# Patient Record
Sex: Female | Born: 1970 | Race: Black or African American | Hispanic: No | Marital: Single | Smoking: Current some day smoker
Health system: Southern US, Community
[De-identification: ages and names within clinical notes are randomized; demographics above are authoritative.]

## PROBLEM LIST (undated history)

## (undated) DIAGNOSIS — D649 Anemia, unspecified: Secondary | ICD-10-CM

## (undated) DIAGNOSIS — K0889 Other specified disorders of teeth and supporting structures: Secondary | ICD-10-CM

## (undated) DIAGNOSIS — Z5189 Encounter for other specified aftercare: Secondary | ICD-10-CM

## (undated) DIAGNOSIS — K254 Chronic or unspecified gastric ulcer with hemorrhage: Secondary | ICD-10-CM

## (undated) HISTORY — DX: Other specified disorders of teeth and supporting structures: K08.89

## (undated) HISTORY — DX: Anemia, unspecified: D64.9

## (undated) HISTORY — DX: Encounter for other specified aftercare: Z51.89

## (undated) HISTORY — DX: Chronic or unspecified gastric ulcer with hemorrhage: K25.4

---

## 1998-03-14 ENCOUNTER — Emergency Department (HOSPITAL_COMMUNITY): Admission: EM | Admit: 1998-03-14 | Discharge: 1998-03-14 | Payer: Self-pay | Admitting: Emergency Medicine

## 1998-03-19 ENCOUNTER — Emergency Department (HOSPITAL_COMMUNITY): Admission: EM | Admit: 1998-03-19 | Discharge: 1998-03-19 | Payer: Self-pay | Admitting: Emergency Medicine

## 2003-07-03 ENCOUNTER — Emergency Department (HOSPITAL_COMMUNITY): Admission: EM | Admit: 2003-07-03 | Discharge: 2003-07-03 | Payer: Self-pay | Admitting: Emergency Medicine

## 2005-12-23 ENCOUNTER — Emergency Department (HOSPITAL_COMMUNITY): Admission: EM | Admit: 2005-12-23 | Discharge: 2005-12-23 | Payer: Self-pay | Admitting: Emergency Medicine

## 2006-12-17 ENCOUNTER — Emergency Department (HOSPITAL_COMMUNITY): Admission: EM | Admit: 2006-12-17 | Discharge: 2006-12-18 | Payer: Self-pay | Admitting: Emergency Medicine

## 2009-05-13 ENCOUNTER — Emergency Department (HOSPITAL_COMMUNITY): Admission: EM | Admit: 2009-05-13 | Discharge: 2009-05-13 | Payer: Self-pay | Admitting: Emergency Medicine

## 2009-07-18 ENCOUNTER — Emergency Department (HOSPITAL_COMMUNITY): Admission: EM | Admit: 2009-07-18 | Discharge: 2009-07-18 | Payer: Self-pay | Admitting: Emergency Medicine

## 2009-12-15 ENCOUNTER — Other Ambulatory Visit: Admission: RE | Admit: 2009-12-15 | Discharge: 2009-12-15 | Payer: Self-pay | Admitting: Family Medicine

## 2010-02-28 ENCOUNTER — Emergency Department (HOSPITAL_COMMUNITY): Admission: EM | Admit: 2010-02-28 | Discharge: 2010-02-28 | Payer: Self-pay | Admitting: Family Medicine

## 2010-11-19 ENCOUNTER — Encounter: Payer: Self-pay | Admitting: Family Medicine

## 2014-09-24 ENCOUNTER — Inpatient Hospital Stay (HOSPITAL_COMMUNITY)
Admission: EM | Admit: 2014-09-24 | Discharge: 2014-09-28 | DRG: 812 | Disposition: A | Discharge status: Home / Self Care | Payer: Self-pay | Attending: Internal Medicine | Admitting: Internal Medicine

## 2014-09-24 ENCOUNTER — Encounter (HOSPITAL_COMMUNITY): Payer: Self-pay

## 2014-09-24 DIAGNOSIS — T39315A Adverse effect of propionic acid derivatives, initial encounter: Secondary | ICD-10-CM | POA: Diagnosis present

## 2014-09-24 DIAGNOSIS — K259 Gastric ulcer, unspecified as acute or chronic, without hemorrhage or perforation: Secondary | ICD-10-CM | POA: Diagnosis present

## 2014-09-24 DIAGNOSIS — E6609 Other obesity due to excess calories: Secondary | ICD-10-CM | POA: Diagnosis present

## 2014-09-24 DIAGNOSIS — K0889 Other specified disorders of teeth and supporting structures: Secondary | ICD-10-CM | POA: Insufficient documentation

## 2014-09-24 DIAGNOSIS — K921 Melena: Secondary | ICD-10-CM | POA: Insufficient documentation

## 2014-09-24 DIAGNOSIS — K088 Other specified disorders of teeth and supporting structures: Secondary | ICD-10-CM | POA: Diagnosis present

## 2014-09-24 DIAGNOSIS — R7989 Other specified abnormal findings of blood chemistry: Secondary | ICD-10-CM | POA: Diagnosis present

## 2014-09-24 DIAGNOSIS — F17210 Nicotine dependence, cigarettes, uncomplicated: Secondary | ICD-10-CM | POA: Diagnosis present

## 2014-09-24 DIAGNOSIS — D72829 Elevated white blood cell count, unspecified: Secondary | ICD-10-CM | POA: Diagnosis present

## 2014-09-24 DIAGNOSIS — K2980 Duodenitis without bleeding: Secondary | ICD-10-CM | POA: Diagnosis present

## 2014-09-24 DIAGNOSIS — Z6841 Body Mass Index (BMI) 40.0 and over, adult: Secondary | ICD-10-CM

## 2014-09-24 DIAGNOSIS — E861 Hypovolemia: Secondary | ICD-10-CM | POA: Diagnosis present

## 2014-09-24 DIAGNOSIS — D62 Acute posthemorrhagic anemia: Principal | ICD-10-CM | POA: Diagnosis present

## 2014-09-24 DIAGNOSIS — R1013 Epigastric pain: Secondary | ICD-10-CM | POA: Diagnosis present

## 2014-09-24 DIAGNOSIS — K922 Gastrointestinal hemorrhage, unspecified: Secondary | ICD-10-CM | POA: Diagnosis present

## 2014-09-24 DIAGNOSIS — I951 Orthostatic hypotension: Secondary | ICD-10-CM | POA: Diagnosis present

## 2014-09-24 MED ORDER — HYDROMORPHONE HCL 1 MG/ML IJ SOLN
0.5000 mg | INTRAMUSCULAR | Status: AC
  Administered 2014-09-25: 0.5 mg via INTRAVENOUS
  Filled 2014-09-24: qty 1

## 2014-09-24 MED ORDER — ONDANSETRON HCL 4 MG/2ML IJ SOLN
4.0000 mg | Freq: Once | INTRAMUSCULAR | Status: AC
  Administered 2014-09-25: 4 mg via INTRAVENOUS
  Filled 2014-09-24: qty 2

## 2014-09-24 MED ORDER — SODIUM CHLORIDE 0.9 % IV BOLUS (SEPSIS)
1000.0000 mL | INTRAVENOUS | Status: AC
  Administered 2014-09-25: 1000 mL via INTRAVENOUS

## 2014-09-24 NOTE — ED Notes (Signed)
Pt comes via [facility] from home, abd pain and diarrhea for past few days, tonight weakness and dizziness when sitting up. Initial EMS BP was 90/60.

## 2014-09-24 NOTE — ED Provider Notes (Signed)
CSN: [id number]     Arrival date & time 09/24/14  2304 History  This chart was scribed for Purvis SheffieldForrest Paula Disbrow, MD by Gwenyth Oberatherine Macek, ED Scribe. This patient was seen in room A05C/A05C and the patient's care was started at 11:34 PM.   Chief Complaint  Patient presents with  . Dizziness   Patient is a 43 y.o. female presenting with dizziness and abdominal pain. The history is provided by the patient. No language interpreter was used.  Dizziness Associated symptoms: diarrhea, nausea and vomiting   Abdominal Pain Pain location:  Epigastric Pain quality: cramping   Pain radiates to:  Does not radiate Pain severity:  Mild Onset quality:  Gradual Duration:  1 day Timing:  Constant Progression:  Unchanged Chronicity:  New Context: not alcohol use and not previous surgeries   Relieved by:  Nothing Worsened by:  Nothing tried Ineffective treatments:  None tried Associated symptoms: diarrhea, nausea and vomiting   Associated symptoms: no cough, no fatigue and no hematuria   Diarrhea:    Quality:  Black and tarry   Number of occurrences:  3   Duration:  1 day   Timing:  Intermittent   Progression:  Unable to specify Risk factors: has not had multiple surgeries and not pregnant     HPI Comments: Paula Thompson is a 43 y.o. female who presents to the Emergency Department complaining of cramping, central abdominal pain that started 1 day ago. Pt notes 3 episodes of black-colored diarrhea, diaphoresis, nausea, vomiting, dizziness when walking and a nose bleed that occurred yesterday as associated symptoms. Pt's mother called paramedics today who took BP which measured 90/60. Pt does not take anti-coagulants or Aspirin. She reports occasional EtOH use and that she smokes regularly. Pt denies taking regular medications, history of medical problems, and possibility of pregnancy. Her family denies history of GI bleed. Pt had multiple episodes of brown-colored vomit in the ED. Upon vomiting, pt states  abdominal pain improved.  History reviewed. No pertinent past medical history. History reviewed. No pertinent past surgical history. No family history on file. History  Substance Use Topics  . Smoking status: Current Every Day Smoker  . Smokeless tobacco: Not on file  . Alcohol Use: Yes   OB History    No data available     Review of Systems  Constitutional: Positive for diaphoresis. Negative for appetite change and fatigue.  HENT: Negative for congestion, ear discharge and sinus pressure.   Eyes: Negative for discharge.  Respiratory: Negative for cough.   Gastrointestinal: Positive for nausea, vomiting, abdominal pain and diarrhea.  Genitourinary: Negative for frequency and hematuria.  Musculoskeletal: Negative for back pain.  Skin: Negative for rash.  Neurological: Positive for dizziness. Negative for seizures.  Psychiatric/Behavioral: Negative for hallucinations.   Allergies  Review of patient's allergies indicates no known allergies.  Home Medications   Prior to Admission medications   Not on File   BP 99/48 mmHg  Pulse 122  Temp(Src) 98 F (36.7 C) (Oral)  Resp 25  Ht 5\' 5"  (1.651 m)  Wt 247 lb (112.038 kg)  BMI 41.10 kg/m2  SpO2 95%  LMP 09/17/2014 Physical Exam  Constitutional: She is oriented to person, place, and time. She appears well-developed and well-nourished. No distress.  HENT:  Head: Normocephalic and atraumatic.  Mouth/Throat: Oropharynx is clear and moist.  Eyes: EOM are normal. Pupils are equal, round, and reactive to light.  Neck: Normal range of motion.  Cardiovascular: Regular rhythm and normal heart sounds.  HR 120's  Pulmonary/Chest: Effort normal and breath sounds normal.  Abdominal: Soft. She exhibits no distension. There is tenderness.  Mild TTP to epigastric area  Genitourinary:  Normal-appearing external rectum. Melanotic appearing stool. Fecal occult positive.  Musculoskeletal: Normal range of motion.  Neurological: She is  alert and oriented to person, place, and time.  Skin: Skin is warm and dry.  Psychiatric: She has a normal mood and affect. Judgment normal.  Nursing note and vitals reviewed.   ED Course  Procedures (including critical care time) DIAGNOSTIC STUDIES: Oxygen Saturation is 95% on RA, adequate by my interpretation.    COORDINATION OF CARE: 11:39 PM Discussed treatment plan with pt at bedside and pt agreed to plan.  Labs Review Labs Reviewed  CBC - Abnormal; Notable for the following:    WBC 17.1 (*)    RBC 3.03 (*)    Hemoglobin 7.7 (*)    HCT 24.5 (*)    MCH 25.4 (*)    RDW 16.4 (*)    All other components within normal limits  BASIC METABOLIC PANEL - Abnormal; Notable for the following:    Glucose, Bld 150 (*)    BUN 54 (*)    GFR calc non Af Amer 79 (*)    All other components within normal limits  HEPATIC FUNCTION PANEL - Abnormal; Notable for the following:    Albumin 2.9 (*)    All other components within normal limits  CBG MONITORING, ED - Abnormal; Notable for the following:    Glucose-Capillary 144 (*)    All other components within normal limits  POC OCCULT BLOOD, ED - Abnormal; Notable for the following:    Fecal Occult Bld POSITIVE (*)    All other components within normal limits  LACTIC ACID, PLASMA  PROTIME-INR  URINALYSIS, ROUTINE W REFLEX MICROSCOPIC  OCCULT BLOOD X 1 CARD TO LAB, STOOL  POC URINE PREG, ED  TYPE AND SCREEN  ABO/RH    Imaging Review Ct Abdomen Pelvis W Contrast  09/25/2014   CLINICAL DATA:  43 year old female with a several day history of abdominal pain and diarrhea.  EXAM: CT ABDOMEN AND PELVIS WITH CONTRAST  TECHNIQUE: Multidetector CT imaging of the abdomen and pelvis was performed using the standard protocol following bolus administration of intravenous contrast.  CONTRAST:  OMNIPAQUE IOHEXOL 300 MG/ML  SOLN  COMPARISON:  Lumbar spine radiographs 05/13/2009  FINDINGS: Lower Chest: Respiratory motion limits evaluation for small  pulmonary nodules. Within these limitations, the visualized lungs are clear. Visualized cardiac structures are within normal limits for size. Unremarkable distal thoracic esophagus.  Abdomen: Unremarkable appearance of the stomach, duodenum, spleen, adrenal glands and pancreas. Normal hepatic contour and morphology. Tiny sub cm hypo attenuating focus in the right hepatic lobe is most consistent with a small cyst. Gallbladder is unremarkable. No intra or extrahepatic biliary ductal dilatation. Unremarkable appearance of the bilateral kidneys. No focal solid lesion, hydronephrosis or nephrolithiasis. No evidence of obstruction or focal bowel wall thickening. Normal appendix in the right lower quadrant. The terminal ileum is unremarkable. No free fluid or suspicious adenopathy.  Pelvis: Unremarkable bladder, uterus and adnexa. Trace free fluid is likely physiologic. No suspicious adenopathy.  Bones/Soft Tissues: No acute fracture or aggressive appearing lytic or blastic osseous lesion.  Vascular: No significant atherosclerotic vascular disease, aneurysmal dilatation or acute abnormality.  IMPRESSION: No acute abnormality in the abdomen or pelvis to explain the patient's clinical symptoms.   Electronically Signed   By: Isac Caddy.D.  On: 09/25/2014 01:18     EKG Interpretation   Date/Time:  Saturday September 25 2014 00:01:41 EST Ventricular Rate:  123 PR Interval:  135 QRS Duration: 83 QT Interval:  328 QTC Calculation: 469 R Axis:   90 Text Interpretation:  Sinus tachycardia Borderline right axis deviation  Consider left ventricular hypertrophy no previous for comparison Confirmed  by Yamato Kopf  MD, Tacie Mccuistion ([id number]) on 09/25/2014 12:24:16 AM     CRITICAL CARE Performed by: Purvis Sheffield, S Total critical care time: 30 min Critical care time was exclusive of separately billable procedures and treating other patients. Critical care was necessary to treat or prevent imminent or  life-threatening deterioration. Critical care was time spent personally by me on the following activities: development of treatment plan with patient and/or surrogate as well as nursing, discussions with consultants, evaluation of patient's response to treatment, examination of patient, obtaining history from patient or surrogate, ordering and performing treatments and interventions, ordering and review of laboratory studies, ordering and review of radiographic studies, pulse oximetry and re-evaluation of patient's condition.   MDM   Final diagnoses:  Epigastric pain  Gastrointestinal hemorrhage with melena  Acute blood loss anemia    1:05 AM 43 y.o. female who presents with nausea, dark stools, and epigastric cramping since yesterday. She is noted to be tachycardic and mildly hypotensive here. She notes no history of GI bleeding. She had several episodes of coffee-ground emesis on my exam. Her stool appears melanotic and was occult positive. We'll start Protonix IV. We'll give a bolus of fluid, pain control, and get screening lab work and CT of her abdomen due to the epigastric pain.   critical care time billed on this patient with acute GI bleed , heart rate in the 120s, acute blood loss anemia with hemoglobin of 7.7 requiring stepdown unit bed. The patient will be admitted unassigned, to the hospitalist.      I personally performed the services described in this documentation, which was scribed in my presence. The recorded information has been reviewed and is accurate.      Purvis Sheffield, MD 09/25/14 Earle Gell

## 2014-09-25 ENCOUNTER — Emergency Department (HOSPITAL_COMMUNITY): Payer: Self-pay

## 2014-09-25 DIAGNOSIS — D72829 Elevated white blood cell count, unspecified: Secondary | ICD-10-CM | POA: Diagnosis present

## 2014-09-25 DIAGNOSIS — K922 Gastrointestinal hemorrhage, unspecified: Secondary | ICD-10-CM | POA: Diagnosis present

## 2014-09-25 DIAGNOSIS — R1013 Epigastric pain: Secondary | ICD-10-CM

## 2014-09-25 DIAGNOSIS — I951 Orthostatic hypotension: Secondary | ICD-10-CM

## 2014-09-25 DIAGNOSIS — R7989 Other specified abnormal findings of blood chemistry: Secondary | ICD-10-CM

## 2014-09-25 DIAGNOSIS — D62 Acute posthemorrhagic anemia: Principal | ICD-10-CM | POA: Diagnosis present

## 2014-09-25 DIAGNOSIS — K921 Melena: Secondary | ICD-10-CM

## 2014-09-25 HISTORY — DX: Gastrointestinal hemorrhage, unspecified: K92.2

## 2014-09-25 LAB — URINALYSIS, ROUTINE W REFLEX MICROSCOPIC
Bilirubin Urine: NEGATIVE
GLUCOSE, UA: NEGATIVE mg/dL
HGB URINE DIPSTICK: NEGATIVE
Ketones, ur: NEGATIVE mg/dL
Leukocytes, UA: NEGATIVE
Nitrite: NEGATIVE
PROTEIN: NEGATIVE mg/dL
Specific Gravity, Urine: <1.005 — ABNORMAL LOW (ref 1.005–1.030)
Urobilinogen, UA: 0.2 mg/dL (ref 0.0–1.0)
pH: 5 (ref 5.0–8.0)

## 2014-09-25 LAB — HEPATIC FUNCTION PANEL
ALT: 14 U/L (ref 0–35)
AST: 13 U/L (ref 0–37)
Albumin: 2.9 g/dL — ABNORMAL LOW (ref 3.5–5.2)
Alkaline Phosphatase: 45 U/L (ref 39–117)
Bilirubin, Direct: <0.2 mg/dL (ref 0.0–0.3)
TOTAL PROTEIN: 6.1 g/dL (ref 6.0–8.3)
Total Bilirubin: 0.3 mg/dL (ref 0.3–1.2)

## 2014-09-25 LAB — CBC
HEMATOCRIT: 24 % — AB (ref 36.0–46.0)
HEMATOCRIT: 24.5 % — AB (ref 36.0–46.0)
HEMOGLOBIN: 7.8 g/dL — AB (ref 12.0–15.0)
Hemoglobin: 7.7 g/dL — ABNORMAL LOW (ref 12.0–15.0)
MCH: 25.7 pg — ABNORMAL LOW (ref 26.0–34.0)
MCH: 25.4 pg — AB (ref 26.0–34.0)
MCHC: 32.5 g/dL (ref 30.0–36.0)
MCHC: 31.4 g/dL (ref 30.0–36.0)
MCV: 79.2 fL (ref 78.0–100.0)
MCV: 80.9 fL (ref 78.0–100.0)
PLATELETS: 312 10*3/uL (ref 150–400)
Platelets: 202 10*3/uL (ref 150–400)
RBC: 3.03 MIL/uL — AB (ref 3.87–5.11)
RBC: 3.03 MIL/uL — ABNORMAL LOW (ref 3.87–5.11)
RDW: 15.5 % (ref 11.5–15.5)
RDW: 16.4 % — AB (ref 11.5–15.5)
WBC: 10.8 10*3/uL — AB (ref 4.0–10.5)
WBC: 17.1 10*3/uL — AB (ref 4.0–10.5)

## 2014-09-25 LAB — ABO/RH: ABO/RH(D): O POS

## 2014-09-25 LAB — BASIC METABOLIC PANEL
Anion gap: 15 (ref 5–15)
BUN: 54 mg/dL — AB (ref 6–23)
CO2: 22 mEq/L (ref 19–32)
CREATININE: 0.87 mg/dL (ref 0.50–1.10)
Calcium: 10.3 mg/dL (ref 8.4–10.5)
Chloride: 104 mEq/L (ref 96–112)
GFR calc Af Amer: >90 mL/min (ref >90)
GFR, EST NON AFRICAN AMERICAN: 79 mL/min — AB (ref >90)
Glucose, Bld: 150 mg/dL — ABNORMAL HIGH (ref 70–99)
Potassium: 3.8 mEq/L (ref 3.7–5.3)
Sodium: 141 mEq/L (ref 137–147)

## 2014-09-25 LAB — PROTIME-INR
INR: 1.04 (ref 0.00–1.49)
Prothrombin Time: 13.7 seconds (ref 11.6–15.2)

## 2014-09-25 LAB — POC OCCULT BLOOD, ED: Fecal Occult Bld: POSITIVE — AB

## 2014-09-25 LAB — HEMOGLOBIN AND HEMATOCRIT, BLOOD
HEMATOCRIT: 20.8 % — AB (ref 36.0–46.0)
HEMOGLOBIN: 6.5 g/dL — AB (ref 12.0–15.0)

## 2014-09-25 LAB — POC URINE PREG, ED: Preg Test, Ur: NEGATIVE

## 2014-09-25 LAB — CBG MONITORING, ED: Glucose-Capillary: 144 mg/dL — ABNORMAL HIGH (ref 70–99)

## 2014-09-25 LAB — MRSA PCR SCREENING: MRSA by PCR: NEGATIVE

## 2014-09-25 LAB — PREPARE RBC (CROSSMATCH)

## 2014-09-25 LAB — LACTIC ACID, PLASMA: Lactic Acid, Venous: 2.2 mmol/L (ref 0.5–2.2)

## 2014-09-25 MED ORDER — SODIUM CHLORIDE 0.9 % IV SOLN: 8.0000 mg/h | INTRAVENOUS | Status: DC

## 2014-09-25 MED ORDER — ALUM & MAG HYDROXIDE-SIMETH 200-200-20 MG/5ML PO SUSP: 30.0000 mL | Freq: Four times a day (QID) | ORAL | Status: DC | PRN

## 2014-09-25 MED ORDER — INFLUENZA VAC SPLIT QUAD 0.5 ML IM SUSY
0.5000 mL | PREFILLED_SYRINGE | INTRAMUSCULAR | Status: DC
  Filled 2014-09-25: qty 0.5

## 2014-09-25 MED ORDER — PANTOPRAZOLE SODIUM 40 MG IV SOLR: 40.0000 mg | Freq: Two times a day (BID) | INTRAVENOUS | Status: DC

## 2014-09-25 MED ORDER — HYDROMORPHONE HCL 1 MG/ML IJ SOLN
0.5000 mg | Freq: Once | INTRAMUSCULAR | Status: DC
  Filled 2014-09-25: qty 1

## 2014-09-25 MED ORDER — SODIUM CHLORIDE 0.9 % IV SOLN
Freq: Once | INTRAVENOUS | Status: DC
Start: 2014-09-25 — End: 2014-09-25

## 2014-09-25 MED ORDER — ACETAMINOPHEN 650 MG RE SUPP: 650.0000 mg | Freq: Four times a day (QID) | RECTAL | Status: DC | PRN

## 2014-09-25 MED ORDER — ACETAMINOPHEN 325 MG PO TABS
650.0000 mg | ORAL_TABLET | Freq: Four times a day (QID) | ORAL | Status: DC | PRN
  Administered 2014-09-28: 650 mg via ORAL
  Filled 2014-09-25: qty 2

## 2014-09-25 MED ORDER — PANTOPRAZOLE SODIUM 40 MG IV SOLR
80.0000 mg | Freq: Once | INTRAVENOUS | Status: AC
  Administered 2014-09-25: 80 mg via INTRAVENOUS
  Filled 2014-09-25: qty 80

## 2014-09-25 MED ORDER — HYDROMORPHONE HCL 1 MG/ML IJ SOLN: 0.5000 mg | INTRAMUSCULAR | Status: DC | PRN

## 2014-09-25 MED ORDER — ONDANSETRON HCL 4 MG PO TABS: 4.0000 mg | ORAL_TABLET | Freq: Four times a day (QID) | ORAL | Status: DC | PRN

## 2014-09-25 MED ORDER — SODIUM CHLORIDE 0.9 % IV SOLN: INTRAVENOUS | Status: DC

## 2014-09-25 MED ORDER — SODIUM CHLORIDE 0.9 % IV SOLN
8.0000 mg/h | INTRAVENOUS | Status: DC
  Administered 2014-09-25 – 2014-09-27 (×5): 8 mg/h via INTRAVENOUS
  Filled 2014-09-25 (×10): qty 80

## 2014-09-25 MED ORDER — SODIUM CHLORIDE 0.9 % IV BOLUS (SEPSIS)
1000.0000 mL | INTRAVENOUS | Status: AC
  Administered 2014-09-25: 1000 mL via INTRAVENOUS

## 2014-09-25 MED ORDER — ONDANSETRON HCL 4 MG/2ML IJ SOLN: 4.0000 mg | Freq: Four times a day (QID) | INTRAMUSCULAR | Status: DC | PRN

## 2014-09-25 MED ORDER — SODIUM CHLORIDE 0.9 % IV SOLN: Freq: Once | INTRAVENOUS | Status: DC

## 2014-09-25 MED ORDER — OXYCODONE HCL 5 MG PO TABS
5.0000 mg | ORAL_TABLET | ORAL | Status: DC | PRN
  Administered 2014-09-26 – 2014-09-27 (×4): 5 mg via ORAL
  Filled 2014-09-25 (×4): qty 1

## 2014-09-25 MED ORDER — SODIUM CHLORIDE 0.9 % IV SOLN
INTRAVENOUS | Status: DC
  Administered 2014-09-25 – 2014-09-27 (×4): via INTRAVENOUS

## 2014-09-25 MED ORDER — IOHEXOL 300 MG/ML  SOLN
100.0000 mL | Freq: Once | INTRAMUSCULAR | Status: AC | PRN
  Administered 2014-09-25: 100 mL via INTRAVENOUS

## 2014-09-25 NOTE — H&P (Signed)
[facility] Admission History and Physical       SIRINITY OUTLAND ZOX:[id number] DOB: [birth year] DOA: 09/24/2014  Referring physician:  PCP: No primary care provider on file.  Specialists:   Chief Complaint: Nausea, and Black Stools  HPI: Paula Thompson is a 43 y.o. female who resents to the ED with complaints of Nausea and Black tarry stools x 2 days.   She reports that she also had epigastric ABD pain and increased weakness especially when standing up.  While in the ED she had an episode of vomiting and the emesis was coffee-ground like.   Her initial hemoglobin level was 7.7, and an FOBT was sent which returned HEME+.   She was placed on an IV Protonix Drip and referred for medical admission,   Review of Systems:  Constitutional: No Weight Loss, No Weight Gain, Night Sweats, Fevers, Chills, Dizziness, Fatigue, or Generalized Weakness HEENT: No Headaches, Difficulty Swallowing,Tooth/Dental Problems,Sore Throat,  No Sneezing, Rhinitis, Ear Ache, Nasal Congestion, or Post Nasal Drip,  Cardio-vascular:  No Chest pain, Orthopnea, PND, Edema in Lower Extremities, Anasarca, Dizziness, Palpitations  Resp: No Dyspnea, No DOE, No Cough, No Hemoptysis, No Wheezing.    GI: No Heartburn, Indigestion, +Abdominal Pain, +Nausea, Vomiting, Diarrhea, +Hematemesis, Hematochezia, +Melena, Change in Bowel Habits,  Loss of Appetite  GU: No Dysuria, Change in Color of Urine, No Urgency or Frequency, No Flank pain.  Musculoskeletal: No Joint Pain or Swelling, No Decreased Range of Motion, No Back Pain.  Neurologic: No Syncope, No Seizures, Muscle Weakness, Paresthesia, Vision Disturbance or Loss, No Diplopia, No Vertigo, No Difficulty Walking,  Skin: No Rash or Lesions. Psych: No Change in Mood or Affect, No Depression or Anxiety, No Memory loss, No Confusion, or Hallucinations   History reviewed. No pertinent past medical history.    History reviewed. No pertinent past surgical history.      Prior to Admission medications   Not on File   OTC Ibuprofen 800 mg QID x 1 week    No Known Allergies   Social History:  reports that she has been smoking.  She does not have any smokeless tobacco history on file. She reports that she drinks alcohol. Her drug history is not on file.     No family history on file.     Physical Exam:  GEN:  Pleasant Morbidly Obese 43 y.o. African American female examined  and in no acute distress; cooperative with exam Filed Vitals:   09/24/14 2330 09/25/14 0102 09/25/14 0103 09/25/14 0130  BP: 96/57 106/62  110/60  Pulse: 117  108 107  Temp:      TempSrc:      Resp: 23  17 13   Height:      Weight:      SpO2: 91%  94% 98%   Blood pressure 110/60, pulse 107, temperature 98 F (36.7 C), temperature source Oral, resp. rate 13, height 5\' 5"  (1.651 m), weight 112.038 kg (247 lb), last menstrual period 09/17/2014, SpO2 98 %. PSYCH: She is alert and oriented x4; does not appear anxious does not appear depressed; affect is normal HEENT: Normocephalic and Atraumatic, Mucous membranes pink; PERRLA; EOM intact; Fundi:  Benign;  No scleral icterus, Nares: Patent, Oropharynx: Clear, Fair Dentition,    Neck:  FROM, No Cervical Lymphadenopathy nor Thyromegaly or Carotid Bruit; No JVD; Breasts:: Not examined CHEST WALL: No tenderness CHEST: Normal respiration, clear to auscultation bilaterally HEART: Regular rate and rhythm; no murmurs rubs or gallops BACK: No kyphosis or scoliosis;  No CVA tenderness ABDOMEN: Positive Bowel Sounds, Obese, Soft Non-Tender; No Masses, No Organomegaly, No Pannus; No Intertriginous candida. Rectal Exam: Not done EXTREMITIES: No Cyanosis, Clubbing, or Edema; No Ulcerations. Genitalia: not examined PULSES: 2+ and symmetric SKIN: Normal hydration no rash or ulceration CNS:  Alert and Oriented x 4. No Focal Deficits Vascular: pulses palpable throughout    Labs on Admission:  Basic Metabolic Panel:  Recent Labs Lab  09/24/14 2330  NA 141  K 3.8  CL 104  CO2 22  GLUCOSE 150*  BUN 54*  CREATININE 0.87  CALCIUM 10.3   Liver Function Tests:  Recent Labs Lab 09/24/14 2330  AST 13  ALT 14  ALKPHOS 45  BILITOT 0.3  PROT 6.1  ALBUMIN 2.9*   No results for input(s): LIPASE, AMYLASE in the last 168 hours. No results for input(s): AMMONIA in the last 168 hours. CBC:  Recent Labs Lab 09/24/14 2330  WBC 17.1*  HGB 7.7*  HCT 24.5*  MCV 80.9  PLT 312   Cardiac Enzymes: No results for input(s): CKTOTAL, CKMB, CKMBINDEX, TROPONINI in the last 168 hours.  BNP (last 3 results) No results for input(s): PROBNP in the last 8760 hours. CBG:  Recent Labs Lab 09/25/14 0004  GLUCAP 144*    Radiological Exams on Admission: Ct Abdomen Pelvis W Contrast  09/25/2014   CLINICAL DATA:  43 year old female with a several day history of abdominal pain and diarrhea.  EXAM: CT ABDOMEN AND PELVIS WITH CONTRAST  TECHNIQUE: Multidetector CT imaging of the abdomen and pelvis was performed using the standard protocol following bolus administration of intravenous contrast.  CONTRAST:  100mL OMNIPAQUE IOHEXOL 300 MG/ML  SOLN  COMPARISON:  Lumbar spine radiographs 05/13/2009  FINDINGS: Lower Chest: Respiratory motion limits evaluation for small pulmonary nodules. Within these limitations, the visualized lungs are clear. Visualized cardiac structures are within normal limits for size. Unremarkable distal thoracic esophagus.  Abdomen: Unremarkable appearance of the stomach, duodenum, spleen, adrenal glands and pancreas. Normal hepatic contour and morphology. Tiny sub cm hypo attenuating focus in the right hepatic lobe is most consistent with a small cyst. Gallbladder is unremarkable. No intra or extrahepatic biliary ductal dilatation. Unremarkable appearance of the bilateral kidneys. No focal solid lesion, hydronephrosis or nephrolithiasis. No evidence of obstruction or focal bowel wall thickening. Normal appendix in the  right lower quadrant. The terminal ileum is unremarkable. No free fluid or suspicious adenopathy.  Pelvis: Unremarkable bladder, uterus and adnexa. Trace free fluid is likely physiologic. No suspicious adenopathy.  Bones/Soft Tissues: No acute fracture or aggressive appearing lytic or blastic osseous lesion.  Vascular: No significant atherosclerotic vascular disease, aneurysmal dilatation or acute abnormality.  IMPRESSION: No acute abnormality in the abdomen or pelvis to explain the patient's clinical symptoms.   Electronically Signed   By: Malachy MoanHeath  McCullough M.D.   On: 09/25/2014 01:18        Assessment/Plan:   43 y.o. female with   Principal Problem:   1.    Upper GI bleed/ GI bleed-  PUD versus Gastritis   Stepdown Monitoring   IVFs for Fluid Resuscitation   IV Protonix Drip   Gi consult in AM   Active Problems:   2.   Acute blood loss anemia- Due to #1   Monitor H/Hs q 8 hrs x 6     3.   Abdominal pain, epigastric- Due to #1   IV Dilaudid PRN     4.   Orthostatic hypotension- due to Blood and Volume Loss  IVFs for Fluid Resuscitation     5.   Azotemia- Due to GI Bleed     6.   Leukocytosis- Probable Stress Rxn     7.   DVT Prophylaxis   SCDs    Code Status:   FULL CODE   Family Communication:    No Family Present Disposition Plan:     Inpatient    Time spent:  48 Minutes  Ron Parker [facility] Pager [phone]   If 7AM -7PM Please Contact the Day Rounding Team MD for [facility]  If 7PM-7AM, Please Contact Night-Floor Coverage  [email or web address] Password [email or web address] 09/25/2014, 3:17 AM

## 2014-09-25 NOTE — Progress Notes (Signed)
CRITICAL VALUE ALERT  Critical value received:  Hgb 6.4  Date of notification:  09/25/2014  Time of notification:  0402  Critical value read back:Yes.    Nurse who received alert:  Cyril Loosen, RN   MD notified (1st page):  Conley Rolls, P.  Time of first page:  0402  MD notified (2nd page):  Time of second page:  Responding MD:  Mort Sawyers  Time MD responded:  [phone]

## 2014-09-25 NOTE — Progress Notes (Signed)
[place] TEAM 1 - Stepdown/ICU TEAM Progress Note  Paula Thompson OEU:[id number] DOB: [birth year] DOA: 09/24/2014 PCP: No primary care provider on file.  Admit HPI / Brief Narrative: 43 yo female who presented to the ED with complaints of nausea and black tarry stools x 2 days. She reported that she also had epigastric ABD pain and weakness especially when standing up. While in the ED she had an episode of vomiting and the emesis was coffee-ground like. Her initial hemoglobin was 7.7 and a FOBT was HEME+.  HPI/Subjective: Pt seen for f/u visit.  Assessment/Plan:  UGIB w/ epigastric pain GI consulted - cont protonix - clears only - likely related to NSAID use   Acute blood loss anemia Follow serial Hgb   Orthostasis - hypovolemia    Code Status: FULL Family Communication: no family present at time of exam Disposition Plan: SDU  Consultants: GI  Procedures: EGD - pending   Antibiotics: none  DVT prophylaxis: SCDs  Objective: Blood pressure 111/48, pulse 103, temperature 98.3 F (36.8 C), temperature source Oral, resp. rate 19, height 5\' 6"  (1.676 m), weight 112.6 kg (248 lb 3.8 oz), last menstrual period 09/17/2014, SpO2 100 %.  Intake/Output Summary (Last 24 hours) at 09/25/14 1134 Last data filed at 09/25/14 1113  Gross per 24 hour  Intake 3097.67 ml  Output      0 ml  Net 3097.67 ml   Exam: Pt seen for f/u visit  Data Reviewed: Basic Metabolic Panel:  Recent Labs Lab 09/24/14 2330  NA 141  K 3.8  CL 104  CO2 22  GLUCOSE 150*  BUN 54*  CREATININE 0.87  CALCIUM 10.3    Liver Function Tests:  Recent Labs Lab 09/24/14 2330  AST 13  ALT 14  ALKPHOS 45  BILITOT 0.3  PROT 6.1  ALBUMIN 2.9*   No results for input(s): LIPASE, AMYLASE in the last 168 hours. No results for input(s): AMMONIA in the last 168 hours.  Coags: No results for input(s): INR in the last 168 hours.  Invalid input(s): PT No results for input(s): APTT in the  last 168 hours.  CBC:  Recent Labs Lab 09/24/14 2330 09/25/14 0313  WBC 17.1*  --   HGB 7.7* 6.5*  HCT 24.5* 20.8*  MCV 80.9  --   PLT 312  --    CBG:  Recent Labs Lab 09/25/14 0004  GLUCAP 144*    Recent Results (from the past 240 hour(s))  MRSA PCR Screening     Status: None   Collection Time: 09/25/14  3:45 AM  Result Value Ref Range Status   MRSA by PCR NEGATIVE NEGATIVE Final    Comment:        The GeneXpert MRSA Assay (FDA approved for NASAL specimens only), is one component of a comprehensive MRSA colonization surveillance program. It is not intended to diagnose MRSA infection nor to guide or monitor treatment for MRSA infections.      Studies:  Recent x-ray studies have been reviewed in detail by the Attending Physician  Scheduled Meds:  Scheduled Meds: . sodium chloride   Intravenous STAT  . sodium chloride   Intravenous Once  . sodium chloride   Intravenous Once  .  HYDROmorphone (DILAUDID) injection  0.5 mg Intravenous Once  . [START ON 09/26/2014] Influenza vac split quadrivalent PF  0.5 mL Intramuscular Tomorrow-1000  . [START ON 09/28/2014] pantoprazole (PROTONIX) IV  40 mg Intravenous Q12H    Time spent on care of this patient:  25+ mins   Lonia BloodMCCLUNG,Cadon Raczka T , MD   [facility] Office  [phone] Pager - Text Page per Amion as per below:  On-Call/Text Page:      [email or web address]      password [email or web address]  If 7PM-7AM, please contact night-coverage [email or web address] Password [email or web address] 09/25/2014, 11:34 AM   LOS: 1 day

## 2014-09-25 NOTE — Consult Note (Signed)
Consult for Paula Thompson  Reason for Consult: Melena and Anemia Referring Physician: [facility].  Paula Thompson HPI: This is a 43 year old female without significant PMH admitted with melena and nausea.  She states that she had epigastric pain and progressive weakness.  Her weakness progressed to the point that she was unsteady with standing the night before admission.  In the ER she was witnessed to have an episode of coffee-ground emesis.  Further evaluation revealed heme positive stool and an HGB of 7.7 g/dL.  Last week she was self-medicating with ibuprofen 800 mg Q4-6 hours for dental pain.  She feels that her tooth needs to be extracted.  No prior history of these symptoms and she does not use NSAIDs on a routine basis.  She currently denies any abdominal pain and the pain was only present last evening.    History reviewed. No pertinent past medical history.  History reviewed. No pertinent past surgical history.  No family history on file.  Social History:  reports that she has been smoking.  She does not have any smokeless tobacco history on file. She reports that she drinks alcohol. Her drug history is not on file.  Allergies: No Known Allergies  Medications:  Scheduled: . sodium chloride   Intravenous STAT  . sodium chloride   Intravenous Once  . sodium chloride   Intravenous Once  .  HYDROmorphone (DILAUDID) injection  0.5 mg Intravenous Once  . [START ON 09/26/2014] Influenza vac split quadrivalent PF  0.5 mL Intramuscular Tomorrow-1000  . [START ON 09/28/2014] pantoprazole (PROTONIX) IV  40 mg Intravenous Q12H   Continuous: . sodium chloride 100 mL/hr at 09/25/14 0401  . pantoprozole (PROTONIX) infusion 8 mg/hr (09/25/14 0128)    Results for orders placed or performed during the hospital encounter of 09/24/14 (from the past 24 hour(s))  CBC  (at [facility])     Status: Abnormal   Collection Time: 09/24/14 11:30 PM  Result Value Ref Range   WBC 17.1 (H)  4.0 - 10.5 K/uL   RBC 3.03 (L) 3.87 - 5.11 MIL/uL   Hemoglobin 7.7 (L) 12.0 - 15.0 g/dL   HCT 24.5 (L) 36.0 - 46.0 %   MCV 80.9 78.0 - 100.0 fL   MCH 25.4 (L) 26.0 - 34.0 pg   MCHC 31.4 30.0 - 36.0 g/dL   RDW 16.4 (H) 11.5 - 15.5 %   Platelets 312 150 - 400 K/uL  Basic metabolic panel  (at [facility])     Status: Abnormal   Collection Time: 09/24/14 11:30 PM  Result Value Ref Range   Sodium 141 137 - 147 mEq/L   Potassium 3.8 3.7 - 5.3 mEq/L   Chloride 104 96 - 112 mEq/L   CO2 22 19 - 32 mEq/L   Glucose, Bld 150 (H) 70 - 99 mg/dL   BUN 54 (H) 6 - 23 mg/dL   Creatinine, Ser 0.87 0.50 - 1.10 mg/dL   Calcium 10.3 8.4 - 10.5 mg/dL   GFR calc non Af Amer 79 (L) >90 mL/min   GFR calc Af Amer >90 >90 mL/min   Anion gap 15 5 - 15  Hepatic function panel     Status: Abnormal   Collection Time: 09/24/14 11:30 PM  Result Value Ref Range   Total Protein 6.1 6.0 - 8.3 g/dL   Albumin 2.9 (L) 3.5 - 5.2 g/dL   AST 13 0 - 37 U/L   ALT 14 0 - 35   U/L   Alkaline Phosphatase 45 39 - 117 U/L   Total Bilirubin 0.3 0.3 - 1.2 mg/dL   Bilirubin, Direct <0.2 0.0 - 0.3 mg/dL   Indirect Bilirubin NOT CALCULATED 0.3 - 0.9 mg/dL  CBG, ED     Status: Abnormal   Collection Time: 09/25/14 12:04 AM  Result Value Ref Range   Glucose-Capillary 144 (H) 70 - 99 mg/dL  Lactic acid, plasma     Status: None   Collection Time: 09/25/14 12:09 AM  Result Value Ref Range   Lactic Acid, Venous 2.2 0.5 - 2.2 mmol/L  Type and screen     Status: None (Preliminary result)   Collection Time: 09/25/14 12:16 AM  Result Value Ref Range   ABO/RH(Thompson) O POS    Antibody Screen NEG    Sample Expiration 09/28/2014    Unit Number [id number]    Blood Component Type RED CELLS,LR    Unit division 00    Status of Unit ALLOCATED    Transfusion Status OK TO TRANSFUSE    Crossmatch Result Compatible    Unit Number [id number]    Blood Component Type RED CELLS,LR    Unit division 00    Status of Unit ISSUED     Transfusion Status OK TO TRANSFUSE    Crossmatch Result Compatible    Unit Number [id number]    Blood Component Type RBC LR PHER1    Unit division 00    Status of Unit ISSUED    Transfusion Status OK TO TRANSFUSE    Crossmatch Result Compatible    Unit Number [id number]    Blood Component Type RED CELLS,LR    Unit division 00    Status of Unit ALLOCATED    Transfusion Status OK TO TRANSFUSE    Crossmatch Result Compatible   ABO/Rh     Status: None   Collection Time: 09/25/14 12:16 AM  Result Value Ref Range   ABO/RH(Thompson) O POS   POC occult blood, ED     Status: Abnormal   Collection Time: 09/25/14 12:39 AM  Result Value Ref Range   Fecal Occult Bld POSITIVE (A) NEGATIVE  Urinalysis, Routine w reflex microscopic     Status: Abnormal   Collection Time: 09/25/14  2:25 AM  Result Value Ref Range   Color, Urine YELLOW YELLOW   APPearance CLEAR CLEAR   Specific Gravity, Urine <1.005 (L) 1.005 - 1.030   pH 5.0 5.0 - 8.0   Glucose, UA NEGATIVE NEGATIVE mg/dL   Hgb urine dipstick NEGATIVE NEGATIVE   Bilirubin Urine NEGATIVE NEGATIVE   Ketones, ur NEGATIVE NEGATIVE mg/dL   Protein, ur NEGATIVE NEGATIVE mg/dL   Urobilinogen, UA 0.2 0.0 - 1.0 mg/dL   Nitrite NEGATIVE NEGATIVE   Leukocytes, UA NEGATIVE NEGATIVE  POC urine preg, ED (not at [facility])     Status: None   Collection Time: 09/25/14  2:29 AM  Result Value Ref Range   Preg Test, Ur NEGATIVE NEGATIVE  Hemoglobin and hematocrit, blood     Status: Abnormal   Collection Time: 09/25/14  3:13 AM  Result Value Ref Range   Hemoglobin 6.5 (LL) 12.0 - 15.0 g/dL   HCT 20.8 (L) 36.0 - 46.0 %  Prepare RBC     Status: None   Collection Time: 09/25/14  3:30 AM  Result Value Ref Range   Order Confirmation ORDER PROCESSED BY BLOOD BANK   MRSA PCR Screening     Status: None   Collection Time: 09/25/14  3:45 AM  Result   Value Ref Range   MRSA by PCR NEGATIVE NEGATIVE     Ct Abdomen Pelvis W Contrast  09/25/2014   CLINICAL DATA:   43-year-old female with a several day history of abdominal pain and diarrhea.  EXAM: CT ABDOMEN AND PELVIS WITH CONTRAST  TECHNIQUE: Multidetector CT imaging of the abdomen and pelvis was performed using the standard protocol following bolus administration of intravenous contrast.  CONTRAST:  100mL OMNIPAQUE IOHEXOL 300 MG/ML  SOLN  COMPARISON:  Lumbar spine radiographs 05/13/2009  FINDINGS: Lower Chest: Respiratory motion limits evaluation for small pulmonary nodules. Within these limitations, the visualized lungs are clear. Visualized cardiac structures are within normal limits for size. Unremarkable distal thoracic esophagus.  Abdomen: Unremarkable appearance of the stomach, duodenum, spleen, adrenal glands and pancreas. Normal hepatic contour and morphology. Tiny sub cm hypo attenuating focus in the right hepatic lobe is most consistent with a small cyst. Gallbladder is unremarkable. No intra or extrahepatic biliary ductal dilatation. Unremarkable appearance of the bilateral kidneys. No focal solid lesion, hydronephrosis or nephrolithiasis. No evidence of obstruction or focal bowel wall thickening. Normal appendix in the right lower quadrant. The terminal ileum is unremarkable. No free fluid or suspicious adenopathy.  Pelvis: Unremarkable bladder, uterus and adnexa. Trace free fluid is likely physiologic. No suspicious adenopathy.  Bones/Soft Tissues: No acute fracture or aggressive appearing lytic or blastic osseous lesion.  Vascular: No significant atherosclerotic vascular disease, aneurysmal dilatation or acute abnormality.  IMPRESSION: No acute abnormality in the abdomen or pelvis to explain the patient's clinical symptoms.   Electronically Signed   By: Heath  McCullough M.Thompson.   On: 09/25/2014 01:18    ROS:  As stated above in the HPI otherwise negative.  Blood pressure 130/64, pulse 106, temperature 98 F (36.7 C), temperature source Oral, resp. rate 16, height 5' 6" (1.676 m), weight 112.6 kg (248 lb  3.8 oz), last menstrual period 09/17/2014, SpO2 97 %.    PE: Gen: NAD, Alert and Oriented HEENT:  [place]/AT, EOMI Neck: Supple, no LAD Lungs: CTA Bilaterally CV: RRR without M/G/R ABM: Soft, NTND, +BS Ext: No C/C/E  Assessment/Plan: 1) Melena. 2) Anemia.   Her history of consistent with an NSAID-induced ulceration with bleeding.  Further evaluation with an EGD is required.  Currently she is hemodynamically stable.  Plan: 1) EGD tomorrow AM. 2) Follow HGB and transfuse as necessary.  Paula Thompson 09/25/2014, 10:18 AM      

## 2014-09-25 NOTE — Plan of Care (Signed)
Problem: Consults Goal: Skin Care Protocol Initiated - if Braden Score 18 or less If consults are not indicated, leave blank or document N/A Outcome: Not Applicable Date Met:  49/20/10  Problem: Phase I Progression Outcomes Goal: Pain controlled with appropriate interventions Outcome: Progressing Goal: OOB as tolerated unless otherwise ordered Outcome: Completed/Met Date Met:  09/25/14 Goal: Voiding-avoid urinary catheter unless indicated Outcome: Completed/Met Date Met:  09/25/14

## 2014-09-26 ENCOUNTER — Encounter (HOSPITAL_COMMUNITY)
Admission: EM | Disposition: A | Discharge status: Home / Self Care | Payer: Self-pay | Source: Home / Self Care | Attending: Internal Medicine

## 2014-09-26 ENCOUNTER — Encounter (HOSPITAL_COMMUNITY): Payer: Self-pay | Admitting: Gastroenterology

## 2014-09-26 HISTORY — PX: ESOPHAGOGASTRODUODENOSCOPY: SHX5428

## 2014-09-26 LAB — COMPREHENSIVE METABOLIC PANEL
ALBUMIN: 2.6 g/dL — AB (ref 3.5–5.2)
ALT: 10 U/L (ref 0–35)
AST: 14 U/L (ref 0–37)
Alkaline Phosphatase: 34 U/L — ABNORMAL LOW (ref 39–117)
Anion gap: 12 (ref 5–15)
BUN: 15 mg/dL (ref 6–23)
CALCIUM: 7.9 mg/dL — AB (ref 8.4–10.5)
CO2: 21 meq/L (ref 19–32)
Chloride: 106 mEq/L (ref 96–112)
Creatinine, Ser: 0.64 mg/dL (ref 0.50–1.10)
GFR calc Af Amer: >90 mL/min (ref >90)
GFR calc non Af Amer: >90 mL/min (ref >90)
Glucose, Bld: 82 mg/dL (ref 70–99)
Potassium: 3.8 mEq/L (ref 3.7–5.3)
Sodium: 139 mEq/L (ref 137–147)
Total Bilirubin: 0.5 mg/dL (ref 0.3–1.2)
Total Protein: 5.2 g/dL — ABNORMAL LOW (ref 6.0–8.3)

## 2014-09-26 LAB — CBC
HCT: 22.5 % — ABNORMAL LOW (ref 36.0–46.0)
HEMATOCRIT: 22.7 % — AB (ref 36.0–46.0)
Hemoglobin: 7.1 g/dL — ABNORMAL LOW (ref 12.0–15.0)
Hemoglobin: 7.1 g/dL — ABNORMAL LOW (ref 12.0–15.0)
MCH: 25.3 pg — AB (ref 26.0–34.0)
MCH: 25.3 pg — AB (ref 26.0–34.0)
MCHC: 31.6 g/dL (ref 30.0–36.0)
MCHC: 31.3 g/dL (ref 30.0–36.0)
MCV: 80.1 fL (ref 78.0–100.0)
MCV: 80.8 fL (ref 78.0–100.0)
PLATELETS: 172 10*3/uL (ref 150–400)
PLATELETS: 185 10*3/uL (ref 150–400)
RBC: 2.81 MIL/uL — AB (ref 3.87–5.11)
RBC: 2.81 MIL/uL — AB (ref 3.87–5.11)
RDW: 16.1 % — AB (ref 11.5–15.5)
RDW: 16.3 % — ABNORMAL HIGH (ref 11.5–15.5)
WBC: 8.3 10*3/uL (ref 4.0–10.5)
WBC: 8.6 10*3/uL (ref 4.0–10.5)

## 2014-09-26 SURGERY — EGD (ESOPHAGOGASTRODUODENOSCOPY)
Anesthesia: Moderate Sedation

## 2014-09-26 MED ORDER — MIDAZOLAM HCL 5 MG/ML IJ SOLN
INTRAMUSCULAR | Status: AC
  Filled 2014-09-26: qty 2

## 2014-09-26 MED ORDER — BUTAMBEN-TETRACAINE-BENZOCAINE 2-2-14 % EX AERO
INHALATION_SPRAY | CUTANEOUS | Status: DC | PRN
  Administered 2014-09-26: 1 via TOPICAL

## 2014-09-26 MED ORDER — FENTANYL CITRATE 0.05 MG/ML IJ SOLN
INTRAMUSCULAR | Status: AC
  Filled 2014-09-26: qty 2

## 2014-09-26 MED ORDER — FENTANYL CITRATE 0.05 MG/ML IJ SOLN
INTRAMUSCULAR | Status: DC | PRN
  Administered 2014-09-26 (×2): 25 ug via INTRAVENOUS

## 2014-09-26 MED ORDER — MIDAZOLAM HCL 10 MG/2ML IJ SOLN
INTRAMUSCULAR | Status: DC | PRN
  Administered 2014-09-26 (×2): 2 mg via INTRAVENOUS

## 2014-09-26 NOTE — Progress Notes (Signed)
Paula Thompson - Stepdown/ICU TEAM Progress Note  Paula Thompson GEX:[id number] DOB: 1971-02-25 DOA: 09/24/2014 PCP: No primary care provider on file.  Admit HPI / Brief Narrative: 43 yo female who presented to the ED with complaints of nausea and black tarry stools x 2 days. She reported that she also had epigastric ABD pain and weakness especially when standing up. While in the ED she had an episode of vomiting and the emesis was coffee-ground like.Her initial hemoglobin was 7.7 and a FOBT was HEME+.  HPI/Subjective: Pt is eating her lunch w/o difficulty at present.  Denies abdom pain, sob, HA, or dizziness.    Assessment/Plan:  UGIB w/ epigastric pain - gastric ulcer / duodenitis  GI consulted - likely related to NSAID use - EGD noted proximal antral clean-based nonbleeding ulcer and moderate duodenitis - cont protonix - counseled pt on need to avoid NSAIDs  Acute blood loss anemia Hgb dropped below 7 yesterday, necessitating transfusion - total of 2U PRBC transfused thus far - follow serial Hgb - Hgb down to 7.Thompson this morning, but no other sign of ongoing bleeding - slow IVF - transfuse prn Hgb < 7.0  Orthostasis - hypovolemia   BP has stabilized w/ IVF and transfusion - follow trend  Code Status: FULL Family Communication: no family present at time of exam Disposition Plan: SDU  Consultants: GI  Procedures: EGD - 11/29 - proximal antral clean-based nonbleeding ulcer and moderate duodenitis  Antibiotics: none  DVT prophylaxis: SCDs  Objective: Blood pressure 112/50, pulse 84, temperature 98.7 F (37.Thompson C), temperature source Oral, resp. rate 19, height 5\' 6"  (Thompson.676 m), weight 112.6 kg (248 lb 3.8 oz), last menstrual period 09/17/2014, SpO2 98 %.  Intake/Output Summary (Last 24 hours) at 09/26/14 1255 Last data filed at 09/26/14 0600  Gross per 24 hour  Intake 3912.5 ml  Output      0 ml  Net 3912.5 ml   Exam: General: No acute respiratory  distress Lungs: Clear to auscultation bilaterally without wheezes or crackles Cardiovascular: Regular rate and rhythm without murmur gallop or rub normal S1 and S2 Abdomen: Nontender, nondistended, soft, bowel sounds positive, no rebound, no ascites, no appreciable mass Extremities: No significant cyanosis, clubbing, or edema bilateral lower extremities   Data Reviewed: Basic Metabolic Panel:  Recent Labs Lab 09/24/14 2330 09/26/14 0217  NA 141 139  K 3.8 3.8  CL 104 106  CO2 22 21  GLUCOSE 150* 82  BUN 54* 15  CREATININE 0.87 0.64  CALCIUM 10.3 7.9*    Liver Function Tests:  Recent Labs Lab 09/24/14 2330 09/26/14 0217  AST 13 14  ALT 14 10  ALKPHOS 45 34*  BILITOT 0.3 0.5  PROT 6.Thompson 5.2*  ALBUMIN 2.9* 2.6*    Coags:  Recent Labs Lab 09/25/14 1515  INR Thompson.04    CBC:  Recent Labs Lab 09/24/14 2330 09/25/14 0313 09/25/14 1515 09/26/14 0217  WBC 17.Thompson*  --  10.8* 8.3  HGB 7.7* 6.5* 7.8* 7.Thompson*  HCT 24.5* 20.8* 24.0* 22.5*  MCV 80.9  --  79.2 80.Thompson  PLT 312  --  202 172   CBG:  Recent Labs Lab 09/25/14 0004  GLUCAP 144*    Recent Results (from the past 240 hour(s))  MRSA PCR Screening     Status: None   Collection Time: 09/25/14  3:45 AM  Result Value Ref Range Status   MRSA by PCR NEGATIVE NEGATIVE Final    Comment:  The GeneXpert MRSA Assay (FDA approved for NASAL specimens only), is one component of a comprehensive MRSA colonization surveillance program. It is not intended to diagnose MRSA infection nor to guide or monitor treatment for MRSA infections.      Studies:  Recent x-ray studies have been reviewed in detail by the Attending Physician  Scheduled Meds:  Scheduled Meds: . Influenza vac split quadrivalent PF  0.5 mL Intramuscular Tomorrow-1000  . [START ON 12/Thompson/2015] pantoprazole (PROTONIX) IV  40 mg Intravenous Q12H    Time spent on care of this patient: 35 mins   Paula Thompson , MD   Paula  Thompson Office  [phone] Pager - Text Page per Paula Thompson as per below:  On-Call/Text Page:      [email or web address]      password Paula Thompson  If 7PM-7AM, please contact night-coverage [email or web address] Password Paula Thompson 09/26/2014, 12:55 PM   LOS: 2 days

## 2014-09-26 NOTE — H&P (View-Only) (Signed)
Consult for [facility]  Reason for Consult: Melena and Anemia Referring Physician: [facility].  Paula Thompson HPI: This is a 43 year old female without significant PMH admitted with melena and nausea.  She states that she had epigastric pain and progressive weakness.  Her weakness progressed to the point that she was unsteady with standing the night before admission.  In the ER she was witnessed to have an episode of coffee-ground emesis.  Further evaluation revealed heme positive stool and an HGB of 7.7 g/dL.  Last week she was self-medicating with ibuprofen 800 mg Q4-6 hours for dental pain.  She feels that her tooth needs to be extracted.  No prior history of these symptoms and she does not use NSAIDs on a routine basis.  She currently denies any abdominal pain and the pain was only present last evening.    History reviewed. No pertinent past medical history.  History reviewed. No pertinent past surgical history.  No family history on file.  Social History:  reports that she has been smoking.  She does not have any smokeless tobacco history on file. She reports that she drinks alcohol. Her drug history is not on file.  Allergies: No Known Allergies  Medications:  Scheduled: . sodium chloride   Intravenous STAT  . sodium chloride   Intravenous Once  . sodium chloride   Intravenous Once  .  HYDROmorphone (DILAUDID) injection  0.5 mg Intravenous Once  . [START ON 09/26/2014] Influenza vac split quadrivalent PF  0.5 mL Intramuscular Tomorrow-1000  . [START ON 09/28/2014] pantoprazole (PROTONIX) IV  40 mg Intravenous Q12H   Continuous: . sodium chloride 100 mL/hr at 09/25/14 0401  . pantoprozole (PROTONIX) infusion 8 mg/hr (09/25/14 0128)    Results for orders placed or performed during the hospital encounter of 09/24/14 (from the past 24 hour(s))  CBC  (at [facility])     Status: Abnormal   Collection Time: 09/24/14 11:30 PM  Result Value Ref Range   WBC 17.1 (H)  4.0 - 10.5 K/uL   RBC 3.03 (L) 3.87 - 5.11 MIL/uL   Hemoglobin 7.7 (L) 12.0 - 15.0 g/dL   HCT 16.1 (L) 09.6 - 04.5 %   MCV 80.9 78.0 - 100.0 fL   MCH 25.4 (L) 26.0 - 34.0 pg   MCHC 31.4 30.0 - 36.0 g/dL   RDW 40.9 (H) 81.1 - 91.4 %   Platelets 312 150 - 400 K/uL  Basic metabolic panel  (at [facility])     Status: Abnormal   Collection Time: 09/24/14 11:30 PM  Result Value Ref Range   Sodium 141 137 - 147 mEq/L   Potassium 3.8 3.7 - 5.3 mEq/L   Chloride 104 96 - 112 mEq/L   CO2 22 19 - 32 mEq/L   Glucose, Bld 150 (H) 70 - 99 mg/dL   BUN 54 (H) 6 - 23 mg/dL   Creatinine, Ser 7.82 0.50 - 1.10 mg/dL   Calcium 95.6 8.4 - 21.3 mg/dL   GFR calc non Af Amer 79 (L) >90 mL/min   GFR calc Af Amer >90 >90 mL/min   Anion gap 15 5 - 15  Hepatic function panel     Status: Abnormal   Collection Time: 09/24/14 11:30 PM  Result Value Ref Range   Total Protein 6.1 6.0 - 8.3 g/dL   Albumin 2.9 (L) 3.5 - 5.2 g/dL   AST 13 0 - 37 U/L   ALT 14 0 - 35  U/L   Alkaline Phosphatase 45 39 - 117 U/L   Total Bilirubin 0.3 0.3 - 1.2 mg/dL   Bilirubin, Direct <0.9<0.2 0.0 - 0.3 mg/dL   Indirect Bilirubin NOT CALCULATED 0.3 - 0.9 mg/dL  CBG, ED     Status: Abnormal   Collection Time: 09/25/14 12:04 AM  Result Value Ref Range   Glucose-Capillary 144 (H) 70 - 99 mg/dL  Lactic acid, plasma     Status: None   Collection Time: 09/25/14 12:09 AM  Result Value Ref Range   Lactic Acid, Venous 2.2 0.5 - 2.2 mmol/L  Type and screen     Status: None (Preliminary result)   Collection Time: 09/25/14 12:16 AM  Result Value Ref Range   ABO/RH(D) O POS    Antibody Screen NEG    Sample Expiration 09/28/2014    Unit Number [id number]    Blood Component Type RED CELLS,LR    Unit division 00    Status of Unit ALLOCATED    Transfusion Status OK TO TRANSFUSE    Crossmatch Result Compatible    Unit Number [id number]    Blood Component Type RED CELLS,LR    Unit division 00    Status of Unit ISSUED     Transfusion Status OK TO TRANSFUSE    Crossmatch Result Compatible    Unit Number [id number]    Blood Component Type RBC LR PHER1    Unit division 00    Status of Unit ISSUED    Transfusion Status OK TO TRANSFUSE    Crossmatch Result Compatible    Unit Number [id number]    Blood Component Type RED CELLS,LR    Unit division 00    Status of Unit ALLOCATED    Transfusion Status OK TO TRANSFUSE    Crossmatch Result Compatible   ABO/Rh     Status: None   Collection Time: 09/25/14 12:16 AM  Result Value Ref Range   ABO/RH(D) O POS   POC occult blood, ED     Status: Abnormal   Collection Time: 09/25/14 12:39 AM  Result Value Ref Range   Fecal Occult Bld POSITIVE (A) NEGATIVE  Urinalysis, Routine w reflex microscopic     Status: Abnormal   Collection Time: 09/25/14  2:25 AM  Result Value Ref Range   Color, Urine YELLOW YELLOW   APPearance CLEAR CLEAR   Specific Gravity, Urine <1.005 (L) 1.005 - 1.030   pH 5.0 5.0 - 8.0   Glucose, UA NEGATIVE NEGATIVE mg/dL   Hgb urine dipstick NEGATIVE NEGATIVE   Bilirubin Urine NEGATIVE NEGATIVE   Ketones, ur NEGATIVE NEGATIVE mg/dL   Protein, ur NEGATIVE NEGATIVE mg/dL   Urobilinogen, UA 0.2 0.0 - 1.0 mg/dL   Nitrite NEGATIVE NEGATIVE   Leukocytes, UA NEGATIVE NEGATIVE  [facility] preg[facility]     Status: None   Collection Time: 09/25/14  2:29 AM  Result Value Ref Range   Preg Test, Ur NEGATIVE NEGATIVE  Hemoglobin and hematocrit, blood     Status: Abnormal   Collection Time: 09/25/14  3:13 AM  Result Value Ref Range   Hemoglobin 6.5 (LL) 12.0 - 15.0 g/dL   HCT 42.520.8 (L) 95.636.0 - 38.746.0 %  Prepare RBC     Status: None   Collection Time: 09/25/14  3:30 AM  Result Value Ref Range   Order Confirmation ORDER PROCESSED BY BLOOD BANK   MRSA PCR Screening     Status: None   Collection Time: 09/25/14  3:45 AM  Result  Value Ref Range   MRSA by PCR NEGATIVE NEGATIVE     Ct Abdomen Pelvis W Contrast  09/25/2014   CLINICAL DATA:   43 year old female with a several day history of abdominal pain and diarrhea.  EXAM: CT ABDOMEN AND PELVIS WITH CONTRAST  TECHNIQUE: Multidetector CT imaging of the abdomen and pelvis was performed using the standard protocol following bolus administration of intravenous contrast.  CONTRAST:  OMNIPAQUE IOHEXOL 300 MG/ML  SOLN  COMPARISON:  Lumbar spine radiographs 05/13/2009  FINDINGS: Lower Chest: Respiratory motion limits evaluation for small pulmonary nodules. Within these limitations, the visualized lungs are clear. Visualized cardiac structures are within normal limits for size. Unremarkable distal thoracic esophagus.  Abdomen: Unremarkable appearance of the stomach, duodenum, spleen, adrenal glands and pancreas. Normal hepatic contour and morphology. Tiny sub cm hypo attenuating focus in the right hepatic lobe is most consistent with a small cyst. Gallbladder is unremarkable. No intra or extrahepatic biliary ductal dilatation. Unremarkable appearance of the bilateral kidneys. No focal solid lesion, hydronephrosis or nephrolithiasis. No evidence of obstruction or focal bowel wall thickening. Normal appendix in the right lower quadrant. The terminal ileum is unremarkable. No free fluid or suspicious adenopathy.  Pelvis: Unremarkable bladder, uterus and adnexa. Trace free fluid is likely physiologic. No suspicious adenopathy.  Bones/Soft Tissues: No acute fracture or aggressive appearing lytic or blastic osseous lesion.  Vascular: No significant atherosclerotic vascular disease, aneurysmal dilatation or acute abnormality.  IMPRESSION: No acute abnormality in the abdomen or pelvis to explain the patient's clinical symptoms.   Electronically Signed   By: Malachy Moan M.D.   On: 09/25/2014 01:18    ROS:  As stated above in the HPI otherwise negative.  Blood pressure 130/64, pulse 106, temperature 98 F (36.7 C), temperature source Oral, resp. rate 16, height 5\' 6"  (1.676 m), weight 112.6 kg (248 lb  3.8 oz), last menstrual period 09/17/2014, SpO2 97 %.    PE: Gen: NAD, Alert and Oriented HEENT:  [place]/AT, EOMI Neck: Supple, no LAD Lungs: CTA Bilaterally CV: RRR without M/G/R ABM: Soft, NTND, +BS Ext: No C/C/E  Assessment/Plan: 1) Melena. 2) Anemia.   Her history of consistent with an NSAID-induced ulceration with bleeding.  Further evaluation with an EGD is required.  Currently she is hemodynamically stable.  Plan: 1) EGD tomorrow AM. 2) Follow HGB and transfuse as necessary.  Julius Matus D 09/25/2014, 10:18 AM

## 2014-09-26 NOTE — Op Note (Signed)
[facility] [address] [place], [address]   ENDOSCOPY PROCEDURE REPORT  PATIENT: Paula Thompson, Paula Thompson  MR#: [id number] BIRTHDATE: 1971-04-28 , 43  yrs. old GENDER: female ENDOSCOPIST:Goldye Tourangeau Elnoria Howard, MD REFERRED BY: PROCEDURE DATE:  10/13/2014 PROCEDURE:   EGD, diagnostic ASA CLASS:    Class II INDICATIONS: Anemia and melena. MEDICATION: Versed 4 mg IV and Fentanyl 50 mcg IV TOPICAL ANESTHETIC:   Cetacaine Spray  DESCRIPTION OF PROCEDURE:   After the risks and benefits of the procedure were explained, informed consent was obtained.  The Pentax Gastroscope [id number]  endoscope was introduced through the mouth  and advanced to the second portion of the duodenum .  The instrument was slowly withdrawn as the mucosa was fully examined.   FINDINGS: The esophagus was normal.  The Z-line was sharp.  In the gastric lumen, along the proximal portion of the antrum on the lessercurvature side, a 1.5 - 2 cm clean-based nonbleeding ulcer was identified.  The ulcer did exhibit a small hemocystic spot. The antrum also exhibited some mild erosions/gastritis consistent with her recent NSAID use.  The duodenal bulb revealed a moderate duodenitis.  No ulcerations in this region.    Retroflexed views revealed no abnormalities.    The scope was then withdrawn from the patient and the procedure completed. COMPLICATIONS: There were no immediate complications.  ENDOSCOPIC IMPRESSION: 1) Proximal antral clean-based nonbleeding ulcer. 2) Moderate duodenitis.  RECOMMENDATIONS: 1) Continue with PPI. 2) Avoid NSAIDS.  If NSAIDs are necessary a concomitant PPI is required for the duration of NSAID use. _______________________________ eSigned:  Jeani Hawking, MD 10-13-14 10:07 AM   cc: CPT CODES: ICD CODES:  The ICD and CPT codes recommended by this software are interpretations from the data that the clinical staff has captured with the software.  The verification of the  translation of this report to the ICD and CPT codes and modifiers is the sole responsibility of the health care institution and practicing physician where this report was generated.  [employer] will not be held responsible for the validity of the ICD and CPT codes included on this report.  AMA assumes no liability for data contained or not contained herein. CPT is a Publishing rights manager of the [employer].

## 2014-09-26 NOTE — Interval H&P Note (Signed)
History and Physical Interval Note:  09/26/2014 10:01 AM  Paula Thompson  has presented today for surgery, with the diagnosis of Anemia and melena  The various methods of treatment have been discussed with the patient and family. After consideration of risks, benefits and other options for treatment, the patient has consented to  Procedure(s): ESOPHAGOGASTRODUODENOSCOPY (EGD) (N/A) as a surgical intervention .  The patient's history has been reviewed, patient examined, no change in status, stable for surgery.  I have reviewed the patient's chart and labs.  Questions were answered to the patient's satisfaction.     Charmelle Soh D

## 2014-09-26 NOTE — Plan of Care (Signed)
Problem: Phase I Progression Outcomes Goal: Pain controlled with appropriate interventions Outcome: Completed/Met Date Met:  09/26/14 Goal: Initial discharge plan identified Outcome: Progressing Goal: Hemodynamically stable Outcome: Progressing  Problem: Phase II Progression Outcomes Goal: Progress activity as tolerated unless otherwise ordered Outcome: Progressing Goal: Vital signs remain stable Outcome: Completed/Met Date Met:  09/26/14 Goal: Obtain order to discontinue catheter if appropriate Outcome: Completed/Met Date Met:  09/26/14

## 2014-09-27 ENCOUNTER — Encounter (HOSPITAL_COMMUNITY): Payer: Self-pay | Admitting: Gastroenterology

## 2014-09-27 DIAGNOSIS — K922 Gastrointestinal hemorrhage, unspecified: Secondary | ICD-10-CM

## 2014-09-27 DIAGNOSIS — K921 Melena: Secondary | ICD-10-CM | POA: Insufficient documentation

## 2014-09-27 LAB — CBC
HEMATOCRIT: 21.8 % — AB (ref 36.0–46.0)
HEMOGLOBIN: 7.1 g/dL — AB (ref 12.0–15.0)
MCH: 27.3 pg (ref 26.0–34.0)
MCHC: 32.6 g/dL (ref 30.0–36.0)
MCV: 83.8 fL (ref 78.0–100.0)
Platelets: 195 10*3/uL (ref 150–400)
RBC: 2.6 MIL/uL — ABNORMAL LOW (ref 3.87–5.11)
RDW: 16.6 % — AB (ref 11.5–15.5)
WBC: 7.7 10*3/uL (ref 4.0–10.5)

## 2014-09-27 LAB — PREPARE RBC (CROSSMATCH)

## 2014-09-27 MED ORDER — PANTOPRAZOLE SODIUM 40 MG PO TBEC
40.0000 mg | DELAYED_RELEASE_TABLET | Freq: Every day | ORAL | Status: DC
Start: 2014-09-27 — End: 2014-09-28
  Administered 2014-09-27 – 2014-09-28 (×2): 40 mg via ORAL
  Filled 2014-09-27 (×2): qty 1

## 2014-09-27 NOTE — Progress Notes (Signed)
          Daily Rounding Note  09/27/2014, 10:13 AM  LOS: 3 days   SUBJECTIVE:       On regular diet. PPI drip ongoing. No stools since arrival, never had melena.  Not dizzy with standing/walking.   OBJECTIVE:         Vital signs in last 24 hours:    Temp:  [97.7 F (36.5 C)-98.7 F (37.1 C)] 98.3 F (36.8 C) (11/30 0700) Pulse Rate:  [83-98] 93 (11/30 0340) Resp:  [13-19] 17 (11/30 0340) BP: (112-137)/(48-57) 132/57 mmHg (11/30 0340) SpO2:  [97 %-99 %] 98 % (11/30 0340) Last BM Date: 09/25/14 Filed Weights   09/24/14 2309 09/25/14 0345  Weight: 247 lb (112.038 kg) 248 lb 3.8 oz (112.6 kg)   General: pleasant, alert, obese, looks well   Heart: RRR Chest: clear bil Abdomen: soft, NT, ND.  No mass, active BD.  No HSM  Extremities: no CCE Neuro/Psych:  Pleasant, relaxed.  No gross deficits, no tremor or limb weakness.   Intake/Output from previous day: 11/29 0701 - 11/30 0700 In: 2238 [P.O.:720; I.V.:1518] Out: -   Intake/Output this shift: Total I/O In: 25 [I.V.:25] Out: -   Lab Results:  Recent Labs  09/26/14 0217 09/26/14 1325 09/27/14 0250  WBC 8.3 8.6 7.7  HGB 7.1* 7.1* 7.1*  HCT 22.5* 22.7* 21.8*  PLT 172 185 195   BMET  Recent Labs  09/24/14 2330 09/26/14 0217  NA 141 139  K 3.8 3.8  CL 104 106  CO2 22 21  GLUCOSE 150* 82  BUN 54* 15  CREATININE 0.87 0.64  CALCIUM 10.3 7.9*   LFT  Recent Labs  09/24/14 2330 09/26/14 0217  PROT 6.1 5.2*  ALBUMIN 2.9* 2.6*  AST 13 14  ALT 14 10  ALKPHOS 45 34*  BILITOT 0.3 0.5  BILIDIR <0.2  --   IBILI NOT CALCULATED  --    PT/INR  Recent Labs  09/25/14 1515  LABPROT 13.7  INR 1.04   Hepatitis Panel No results for input(s): HEPBSAG, HCVAB, HEPAIGM, HEPBIGM in the last 72 hours.  Studies/Results: No results found.  ASSESMENT:   *  Melena, CG emesis, anemia.  S/p PRBC x 2.  Hgb nadir of 6.5 on 11/28.  Hgb low but stable.  11/29 EGD:  clean based antral ulcer. Moderate duodenitis.  No biopsy.   *  Dental pain.  Treating with Ibuprofen 800 mg q 4 to 6 hours prn PTA. Pt aware that NSAIDs, ASA products ought not be used   PLAN   *  Continue daily PPI.  Switched off the PPI drip given that it is a clean based ulcer.  Please prescibe generic Omeprazole, 40 mg daily at discharge. Med costs an issue for uninsured pt.  *   Check CBC in AM if she stays overnight. *  Will arrange GI office follow up.  May need EGD to assure ulcer healing. Will sign off *  Would not transfuse again given lack of sxs.     Jennye Moccasin  09/27/2014, 10:13 AM Pager: [phone]    Attending physician's note   I have taken an interval history, reviewed the chart and examined the patient. I agree with the Advanced Practitioner's note, impression and recommendations. Omeprazole 40 mg daily at discharge for long term mgmt. Avoid ASA/NSAIDs. Outpatient EGD to document ulcer healing in 3 months. GI signing off.   Venita Lick. Russella Dar, MD [facility]

## 2014-09-27 NOTE — Progress Notes (Signed)
Utilization review completed. Tylor Courtwright, RN, BSN. 

## 2014-09-27 NOTE — Progress Notes (Signed)
[place] TEAM 1 - Stepdown/ICU TEAM Progress Note  Paula Thompson ONG:[id number] DOB: 1970/12/18 DOA: 09/24/2014 PCP: No primary care provider on file.  Admit HPI / Brief Narrative: 43 yo female who presented to the ED with complaints of nausea and black tarry stools x 2 days. She reported that she also had epigastric ABD pain and weakness especially when standing up. While in the ED she had an episode of vomiting and the emesis was coffee-ground like.Her initial hemoglobin was 7.7 and a FOBT was HEME+.  HPI/Subjective: Tolerating her regular diet w/o trouble.  Has not yet moved her bowels.  No dizziness or chest pain.  No abdom pain.  Pt informs me that she does NOT have a PCP because she does not have insurance.  She also faces challenges w/ transportation to medical appointments.    Assessment/Plan:  UGIB w/ epigastric pain - gastric ulcer / duodenitis  GI consulted - likely related to NSAID use - EGD noted proximal antral clean-based nonbleeding ulcer and moderate duodenitis - cont protonix - counseled pt on need to avoid NSAIDs - plan to transition to generic prilosec at time of d/c   Acute blood loss anemia Hgb dropped to nadir of 6.5 necessitating transfusion - total of 2U PRBC transfused thus far - Hgb down to 7.1 but has held for the last 3 checks, even after stopping IVF - given pt's lack of reliable care or access to care post d/c, I feel the safest option is to transfuse her 1 additional unit of PRBC today as "insurance" and plan for d/c home in AM if Hgb has improved as expected  Orthostasis - hypovolemia   BP has stabilized w/ IVF and transfusion - follow trend  Code Status: FULL Family Communication: no family present at time of exam Disposition Plan: will not transfer as pt should be d/c home in AM   Consultants: GI  Procedures: EGD - 11/29 - proximal antral clean-based nonbleeding ulcer and moderate duodenitis  Antibiotics: none  DVT  prophylaxis: SCDs  Objective: Blood pressure 132/57, pulse 93, temperature 99.1 F (37.3 C), temperature source Oral, resp. rate 17, height 5\' 6"  (1.676 m), weight 112.6 kg (248 lb 3.8 oz), last menstrual period 09/17/2014, SpO2 98 %.  Intake/Output Summary (Last 24 hours) at 09/27/14 1406 Last data filed at 09/27/14 0800  Gross per 24 hour  Intake   2263 ml  Output      0 ml  Net   2263 ml   Exam: General: No acute respiratory distress Lungs: Clear to auscultation bilaterally without wheezes / crackles Cardiovascular: Regular rate and rhythm without murmur gallop or rub  Abdomen: Nontender, nondistended, soft, bowel sounds positive, no rebound, no ascites, no appreciable mass Extremities: No significant cyanosis, clubbing, edema bilateral lower extremities  Data Reviewed: Basic Metabolic Panel:  Recent Labs Lab 09/24/14 2330 09/26/14 0217  NA 141 139  K 3.8 3.8  CL 104 106  CO2 22 21  GLUCOSE 150* 82  BUN 54* 15  CREATININE 0.87 0.64  CALCIUM 10.3 7.9*    Liver Function Tests:  Recent Labs Lab 09/24/14 2330 09/26/14 0217  AST 13 14  ALT 14 10  ALKPHOS 45 34*  BILITOT 0.3 0.5  PROT 6.1 5.2*  ALBUMIN 2.9* 2.6*    Coags:  Recent Labs Lab 09/25/14 1515  INR 1.04    CBC:  Recent Labs Lab 09/24/14 2330 09/25/14 0313 09/25/14 1515 09/26/14 0217 09/26/14 1325 09/27/14 0250  WBC 17.1*  --  10.8* 8.3 8.6 7.7  HGB 7.7* 6.5* 7.8* 7.1* 7.1* 7.1*  HCT 24.5* 20.8* 24.0* 22.5* 22.7* 21.8*  MCV 80.9  --  79.2 80.1 80.8 83.8  PLT 312  --  202 172 185 195   CBG:  Recent Labs Lab 09/25/14 0004  GLUCAP 144*    Recent Results (from the past 240 hour(s))  MRSA PCR Screening     Status: None   Collection Time: 09/25/14  3:45 AM  Result Value Ref Range Status   MRSA by PCR NEGATIVE NEGATIVE Final    Comment:        The GeneXpert MRSA Assay (FDA approved for NASAL specimens only), is one component of a comprehensive MRSA colonization surveillance  program. It is not intended to diagnose MRSA infection nor to guide or monitor treatment for MRSA infections.      Studies:  Recent x-ray studies have been reviewed in detail by the Attending Physician  Scheduled Meds:  Scheduled Meds: . Influenza vac split quadrivalent PF  0.5 mL Intramuscular Tomorrow-1000  . pantoprazole  40 mg Oral Q0600    Time spent on care of this patient: 25 mins   [facility] T , MD   [facility] Office  [phone] Pager - Text Page per Loretha Stapler as per below:  On-Call/Text Page:      [email or web address]      password [email or web address]  If 7PM-7AM, please contact night-coverage [email or web address] Password [email or web address] 09/27/2014, 2:06 PM   LOS: 3 days

## 2014-09-28 DIAGNOSIS — K088 Other specified disorders of teeth and supporting structures: Secondary | ICD-10-CM

## 2014-09-28 DIAGNOSIS — K0889 Other specified disorders of teeth and supporting structures: Secondary | ICD-10-CM | POA: Insufficient documentation

## 2014-09-28 LAB — CBC
HEMATOCRIT: 25.2 % — AB (ref 36.0–46.0)
Hemoglobin: 7.9 g/dL — ABNORMAL LOW (ref 12.0–15.0)
MCH: 25.6 pg — AB (ref 26.0–34.0)
MCHC: 31.3 g/dL (ref 30.0–36.0)
MCV: 81.6 fL (ref 78.0–100.0)
PLATELETS: 202 10*3/uL (ref 150–400)
RBC: 3.09 MIL/uL — ABNORMAL LOW (ref 3.87–5.11)
RDW: 16.4 % — AB (ref 11.5–15.5)
WBC: 6.7 10*3/uL (ref 4.0–10.5)

## 2014-09-28 MED ORDER — PANTOPRAZOLE SODIUM 40 MG PO TBEC: 40.0000 mg | DELAYED_RELEASE_TABLET | Freq: Two times a day (BID) | ORAL | Status: DC

## 2014-09-28 MED ORDER — TRAMADOL HCL 50 MG PO TABS: 50.0000 mg | ORAL_TABLET | Freq: Four times a day (QID) | ORAL | Status: DC | PRN

## 2014-09-28 NOTE — Discharge Summary (Signed)
Physician Discharge Summary  Paula Thompson PZW:[id number] DOB: Mar 08, 1971 DOA: 09/24/2014  PCP: No primary care provider on file.  Admit date: 09/24/2014 Discharge date: 09/28/2014  Time spent: 40 minutes  Recommendations for Outpatient Follow-up:  GI Bleed;  - Continue PPI -Follow-up with Dr. Russella Dar (GI) as needed in 30 days if GI bleed reoccurs  -Follow-up in 7 days at transitional clinic; NCM Silva Bandy  will arrange.  Acute blood loss anemia  -Resolved  -See GI bleed  Abdominal pain epigastric  -Secondary to GI bleed; resolved   Tooth pain -Patient will follow-up with [facility] dental school for evaluation of tooth pain. -Patient instructed to avoid all aspirin products, NSAID products secondary to new GI bleed. -Will provide patient with short course of tramadol for pain until patient able to be seen at dental school.    Discharge Diagnoses:  Principal Problem:   Upper GI bleed Active Problems:   GI bleed   Acute blood loss anemia   Orthostatic hypotension   Azotemia   Leukocytosis   Abdominal pain, epigastric   Gastrointestinal hemorrhage with melena   Discharge Condition: Stable   Diet recommendation: Regular  Filed Weights   09/24/14 2309 09/25/14 0345  Weight: 112.038 kg (247 lb) 112.6 kg (248 lb 3.8 oz)    History of present illness:  43 yo BF PMHx none.  Presented to the ED with complaints of nausea and black tarry stools x 2 days. She reported that she also had epigastric ABD pain and weakness especially when standing up. While in the ED she had an episode of vomiting and the emesis was coffee-ground like.Her initial hemoglobin was 7.7 and a FOBT was HEME+.Patient was evaluated by by Dr. Judie Petit Stark/Dr Jeani Hawking (GI) who performed EGD on 11/29 which showed 1.5 - 2 cm clean-based nonbleeding ulcer was identified (see results below). Patient has been instructed to continue VI, and avoid all NSAIDs. Hemoglobin has been stable over last 24 hours, patient  ready for discharge.   Procedures: 11/29 EGD;Proximal antral clean-based nonbleeding ulcer. -Moderate duodenitis  Consultations: Dr. Judie Petit Stark/Dr Jeani Hawking (GI)    Antibiotics NA   Discharge Exam: Filed Vitals:   09/27/14 1958 09/28/14 0017 09/28/14 0350 09/28/14 0700  BP:  148/67 141/77   Pulse:  93 103   Temp: 99.1 F (37.3 C) 98.5 F (36.9 C) 98.7 F (37.1 C) 98.1 F (36.7 C)  TempSrc: Oral Oral Oral   Resp:  19 18   Height:      Weight:      SpO2:  100% 99%     General: A/O 4, NAD, Cardiovascular: Regular rhythm or rate, negative murmurs rubs or gallops, normal S1/S2 Respiratory: Clear to auscultation bilateral  Discharge Instructions     Medication List    Notice    You have not been prescribed any medications.     No Known Allergies    The results of significant diagnostics from this hospitalization (including imaging, microbiology, ancillary and laboratory) are listed below for reference.    Significant Diagnostic Studies: Ct Abdomen Pelvis W Contrast  09/25/2014   CLINICAL DATA:  43 year old female with a several day history of abdominal pain and diarrhea.  EXAM: CT ABDOMEN AND PELVIS WITH CONTRAST  TECHNIQUE: Multidetector CT imaging of the abdomen and pelvis was performed using the standard protocol following bolus administration of intravenous contrast.  CONTRAST:  OMNIPAQUE IOHEXOL 300 MG/ML  SOLN  COMPARISON:  Lumbar spine radiographs 05/13/2009  FINDINGS: Lower Chest: Respiratory motion limits  evaluation for small pulmonary nodules. Within these limitations, the visualized lungs are clear. Visualized cardiac structures are within normal limits for size. Unremarkable distal thoracic esophagus.  Abdomen: Unremarkable appearance of the stomach, duodenum, spleen, adrenal glands and pancreas. Normal hepatic contour and morphology. Tiny sub cm hypo attenuating focus in the right hepatic lobe is most consistent with a small cyst. Gallbladder  is unremarkable. No intra or extrahepatic biliary ductal dilatation. Unremarkable appearance of the bilateral kidneys. No focal solid lesion, hydronephrosis or nephrolithiasis. No evidence of obstruction or focal bowel wall thickening. Normal appendix in the right lower quadrant. The terminal ileum is unremarkable. No free fluid or suspicious adenopathy.  Pelvis: Unremarkable bladder, uterus and adnexa. Trace free fluid is likely physiologic. No suspicious adenopathy.  Bones/Soft Tissues: No acute fracture or aggressive appearing lytic or blastic osseous lesion.  Vascular: No significant atherosclerotic vascular disease, aneurysmal dilatation or acute abnormality.  IMPRESSION: No acute abnormality in the abdomen or pelvis to explain the patient's clinical symptoms.   Electronically Signed   By: Malachy MoanHeath  McCullough M.D.   On: 09/25/2014 01:18    Microbiology: Recent Results (from the past 240 hour(s))  MRSA PCR Screening     Status: None   Collection Time: 09/25/14  3:45 AM  Result Value Ref Range Status   MRSA by PCR NEGATIVE NEGATIVE Final    Comment:        The GeneXpert MRSA Assay (FDA approved for NASAL specimens only), is one component of a comprehensive MRSA colonization surveillance program. It is not intended to diagnose MRSA infection nor to guide or monitor treatment for MRSA infections.      Labs: Basic Metabolic Panel:  Recent Labs Lab 09/24/14 2330 09/26/14 0217  NA 141 139  K 3.8 3.8  CL 104 106  CO2 22 21  GLUCOSE 150* 82  BUN 54* 15  CREATININE 0.87 0.64  CALCIUM 10.3 7.9*   Liver Function Tests:  Recent Labs Lab 09/24/14 2330 09/26/14 0217  AST 13 14  ALT 14 10  ALKPHOS 45 34*  BILITOT 0.3 0.5  PROT 6.1 5.2*  ALBUMIN 2.9* 2.6*   No results for input(s): LIPASE, AMYLASE in the last 168 hours. No results for input(s): AMMONIA in the last 168 hours. CBC:  Recent Labs Lab 09/25/14 1515 09/26/14 0217 09/26/14 1325 09/27/14 0250 09/28/14 0631   WBC 10.8* 8.3 8.6 7.7 6.7  HGB 7.8* 7.1* 7.1* 7.1* 7.9*  HCT 24.0* 22.5* 22.7* 21.8* 25.2*  MCV 79.2 80.1 80.8 83.8 81.6  PLT 202 172 185 195 202   Cardiac Enzymes: No results for input(s): CKTOTAL, CKMB, CKMBINDEX, TROPONINI in the last 168 hours. BNP: BNP (last 3 results) No results for input(s): PROBNP in the last 8760 hours. CBG:  Recent Labs Lab 09/25/14 0004  GLUCAP 144*       Signed:  Carolyne Littlesurtis Bradshaw Minihan, MD [facility] [phone] pager

## 2014-09-28 NOTE — Clinical Social Work Note (Signed)
CSW Consult Acknowledged:   CSW received consult to aid pt with financial assistance. CSW contacted the financial counselor Ana to assist the pt with the ACA from. CSW has signed off.   Tylique Aull, MSW, LCSWA [phone]

## 2014-09-28 NOTE — Progress Notes (Signed)
Pt's correct contact numbers- verified with pt- home # [phone], cell # [phone], work # [phone]

## 2014-09-28 NOTE — Progress Notes (Signed)
Discharge instructions reviewed with patient at bedside all questions answered at this time.  Patient verbalized understanding.  Patient stable at discharge.

## 2014-09-28 NOTE — Plan of Care (Signed)
Problem: Consults Goal: General Medical Patient Education See Patient Education Module for specific education.  Outcome: Completed/Met Date Met:  09/28/14 Goal: Nutrition Consult-if indicated Outcome: Not Applicable Date Met:  41/28/78 Goal: Diabetes Guidelines if Diabetic/Glucose > 140 If diabetic or lab glucose is > 140 mg/dl - Initiate Diabetes/Hyperglycemia Guidelines & Document Interventions  Outcome: Not Applicable Date Met:  67/67/20  Problem: Phase I Progression Outcomes Goal: Initial discharge plan identified Outcome: Completed/Met Date Met:  09/28/14 Goal: Hemodynamically stable Outcome: Completed/Met Date Met:  09/28/14 Goal: Other Phase I Outcomes/Goals Outcome: Completed/Met Date Met:  09/28/14

## 2014-09-29 LAB — TYPE AND SCREEN
ABO/RH(D): O POS
ANTIBODY SCREEN: NEGATIVE
UNIT DIVISION: 0
UNIT DIVISION: 0
Unit division: 0
Unit division: 0
Unit division: 0

## 2014-09-29 NOTE — Care Management Note (Signed)
    Page 1 of 1   09/29/2014     3:17:05 PM CARE MANAGEMENT NOTE 09/29/2014  Patient:  Paula Thompson, Paula Thompson   Account Number:  [id number]  Date Initiated:  09/28/2014  Documentation initiated by:  Donn Pierini  Subjective/Objective Assessment:   Pt admitted with anemia and hypotension     Action/Plan:   PTA pt lived at home-   Anticipated DC Date:  09/28/2014   Anticipated DC Plan:  HOME/SELF CARE      DC Planning Services  CM consult  [facility]      Choice offered to / List presented to:             Status of service:  Completed, signed off Medicare Important Message given?  NO (If response is "NO", the following Medicare IM given date fields will be blank) Date Medicare IM given:   Medicare IM given by:   Date Additional Medicare IM given:   Additional Medicare IM given by:    Discharge Disposition:  HOME/SELF CARE  Per UR Regulation:  Reviewed for med. necessity/level of care/duration of stay  If discussed at Long Length of Stay Meetings, dates discussed:    Comments:  09/29/14- 1400- Donn Pierini RN, BSN [phone] Received appointment for f/u at [facility]- for Friday 10/01/14 at 9:00 am- call made to pt to give appointment time.  09/28/14- 1030- Donn Pierini RN, BSN [phone] Spoke with pt at bedside- per conversation pt agreeable to appointment for f/u at [facility]- info given to pt on clinic with address and phone #-  verified pt's address in epic- phone # not correct- pt's correct #s as follows- home # [phone], cell# [phone], work# [phone] pt has info on [employer] [facility], also gave pt info on [employer] dental hygienist school as another resource for finding a dentist who might work with her.  Call made to [facility]- for appointment- message left- awaiting return call.

## 2014-10-01 ENCOUNTER — Ambulatory Visit: Payer: Self-pay | Attending: Family Medicine | Admitting: Family Medicine

## 2014-10-01 ENCOUNTER — Encounter: Payer: Self-pay | Admitting: Family Medicine

## 2014-10-01 VITALS — BP 126/74 | HR 106 | Temp 98.3°F | Resp 18 | Ht 65.0 in | Wt 247.0 lb

## 2014-10-01 DIAGNOSIS — K922 Gastrointestinal hemorrhage, unspecified: Secondary | ICD-10-CM

## 2014-10-01 DIAGNOSIS — K254 Chronic or unspecified gastric ulcer with hemorrhage: Secondary | ICD-10-CM | POA: Insufficient documentation

## 2014-10-01 DIAGNOSIS — K259 Gastric ulcer, unspecified as acute or chronic, without hemorrhage or perforation: Secondary | ICD-10-CM | POA: Insufficient documentation

## 2014-10-01 DIAGNOSIS — Z79899 Other long term (current) drug therapy: Secondary | ICD-10-CM | POA: Insufficient documentation

## 2014-10-01 DIAGNOSIS — K029 Dental caries, unspecified: Secondary | ICD-10-CM | POA: Insufficient documentation

## 2014-10-01 DIAGNOSIS — K088 Other specified disorders of teeth and supporting structures: Secondary | ICD-10-CM

## 2014-10-01 DIAGNOSIS — D62 Acute posthemorrhagic anemia: Secondary | ICD-10-CM | POA: Insufficient documentation

## 2014-10-01 DIAGNOSIS — K0889 Other specified disorders of teeth and supporting structures: Secondary | ICD-10-CM

## 2014-10-01 MED ORDER — AMOXICILLIN 875 MG PO TABS: 875.0000 mg | ORAL_TABLET | Freq: Two times a day (BID) | ORAL | Status: DC

## 2014-10-01 MED ORDER — TRAMADOL HCL 50 MG PO TABS: 50.0000 mg | ORAL_TABLET | Freq: Four times a day (QID) | ORAL | Status: DC | PRN

## 2014-10-01 MED ORDER — AMOXICILLIN 500 MG PO CAPS: 500.0000 mg | ORAL_CAPSULE | Freq: Two times a day (BID) | ORAL | Status: DC

## 2014-10-01 NOTE — Assessment & Plan Note (Signed)
A: from antral ulcer, in remission P: Cbc next week Continue PPI

## 2014-10-01 NOTE — Progress Notes (Signed)
Establish Care HFU GI problems

## 2014-10-01 NOTE — Assessment & Plan Note (Signed)
A: no sign of ongoing loss P: Continue PPI Continue to avoid NSAID CBC next week, ordered

## 2014-10-01 NOTE — Assessment & Plan Note (Signed)
A: R lower molar P: Tramadol prn rx for amox prn swelling/fever List of dentist locally along with plan for patient to pursue [facility] dental school. Patient to also apply for orange card and [place] discount

## 2014-10-01 NOTE — Patient Instructions (Signed)
Ms. Rambert,  Thank you for coming in today. It was a pleasure meeting you. I look forward to being your primary doctor.   1. Dental pain: amoxicillin and tramadol for as needed use.   2. Anemia: schedule blood draw next week for CBC.    Apply for [place], [place] discount and orange card.  F/u in 3-4 weeks for physical with pap  Dr. Armen Pickup

## 2014-10-01 NOTE — Assessment & Plan Note (Signed)
A: treating no sign of ongoing damage or blood loss P: Continue PPI Continue avoidance of NSAID

## 2014-10-01 NOTE — Progress Notes (Signed)
   Subjective:    Patient ID: Paula Thompson, female    DOB: 02-06-1971, 43 y.o.   MRN: [id number] CC: HFU for GI bleed   HPI 43 yo F:  1. GI bleed: resulted in acute blood loss anemia.  EGD in hospital,  Stomach ulcer and duodenitis identified. Patient on PPI. No abdominal pain, emesis or blood in stool. No NSAIDs.  2. Tooth pain: R lower molar, painful, intermittent abscess. Cannot afford to see dentist. No longer taking NSAID. Taking tramadol prn. No swelling or fever currently.  Soc hx: non smoker  Med Hx: negative for chronic anemia.  Fam Hx: + DM in father   Review of Systems As per HPI     Objective:   Physical Exam BP 126/74 mmHg  Pulse 106  Temp(Src) 98.3 F (36.8 C) (Oral)  Resp 18  Ht 5\' 5"  (1.651 m)  Wt 247 lb (112.038 kg)  BMI 41.10 kg/m2  SpO2 100%  LMP 09/17/2014 General appearance: alert, cooperative and no distress Throat: abnormal findings: dentition: multiple carries, L molar carrie with moderate gingival swelling and tenderness.  Lungs: clear to auscultation bilaterally Heart: regular rate and rhythm, S1, S2 normal, no murmur, click, rub or gallop     Assessment & Plan:

## 2014-10-25 ENCOUNTER — Encounter: Payer: Self-pay | Admitting: Physician Assistant

## 2014-10-25 ENCOUNTER — Other Ambulatory Visit (INDEPENDENT_AMBULATORY_CARE_PROVIDER_SITE_OTHER): Payer: Self-pay

## 2014-10-25 ENCOUNTER — Other Ambulatory Visit: Payer: Self-pay | Admitting: *Deleted

## 2014-10-25 ENCOUNTER — Ambulatory Visit (INDEPENDENT_AMBULATORY_CARE_PROVIDER_SITE_OTHER): Payer: Self-pay | Admitting: Physician Assistant

## 2014-10-25 VITALS — BP 132/76 | HR 72 | Ht 65.0 in | Wt 254.0 lb

## 2014-10-25 DIAGNOSIS — K921 Melena: Secondary | ICD-10-CM

## 2014-10-25 DIAGNOSIS — D649 Anemia, unspecified: Secondary | ICD-10-CM

## 2014-10-25 DIAGNOSIS — K259 Gastric ulcer, unspecified as acute or chronic, without hemorrhage or perforation: Secondary | ICD-10-CM

## 2014-10-25 DIAGNOSIS — D509 Iron deficiency anemia, unspecified: Secondary | ICD-10-CM

## 2014-10-25 LAB — CBC WITH DIFFERENTIAL/PLATELET
BASOS PCT: 0.4 % (ref 0.0–3.0)
Basophils Absolute: 0 10*3/uL (ref 0.0–0.1)
EOS ABS: 0.2 10*3/uL (ref 0.0–0.7)
Eosinophils Relative: 2.2 % (ref 0.0–5.0)
HCT: 22 % — CL (ref 36.0–46.0)
Hemoglobin: 6.7 g/dL — CL (ref 12.0–15.0)
LYMPHS PCT: 22 % (ref 12.0–46.0)
Lymphs Abs: 1.9 10*3/uL (ref 0.7–4.0)
MCHC: 30.3 g/dL (ref 30.0–36.0)
MCV: 70.1 fl — ABNORMAL LOW (ref 78.0–100.0)
Monocytes Absolute: 1.2 10*3/uL — ABNORMAL HIGH (ref 0.1–1.0)
Monocytes Relative: 13.8 % — ABNORMAL HIGH (ref 3.0–12.0)
NEUTROS PCT: 61.6 % (ref 43.0–77.0)
Neutro Abs: 5.3 10*3/uL (ref 1.4–7.7)
Platelets: 209 10*3/uL (ref 150.0–400.0)
RBC: 3.13 Mil/uL — AB (ref 3.87–5.11)
RDW: 27.1 % — ABNORMAL HIGH (ref 11.5–15.5)
WBC: 8.6 10*3/uL (ref 4.0–10.5)

## 2014-10-25 LAB — H. PYLORI ANTIBODY, IGG: H PYLORI IGG: POSITIVE — AB

## 2014-10-25 MED ORDER — BIS SUBCIT-METRONID-TETRACYC 140-125-125 MG PO CAPS: 3.0000 | ORAL_CAPSULE | Freq: Three times a day (TID) | ORAL | Status: DC

## 2014-10-25 MED ORDER — PANTOPRAZOLE SODIUM 40 MG PO TBEC: 40.0000 mg | DELAYED_RELEASE_TABLET | Freq: Two times a day (BID) | ORAL | Status: DC

## 2014-10-25 MED ORDER — INTEGRA PLUS PO CAPS: ORAL_CAPSULE | ORAL | Status: DC

## 2014-10-25 NOTE — Progress Notes (Signed)
Reviewed and agree with management plan.  Noach Calvillo T. Josemiguel Gries, MD FACG 

## 2014-10-25 NOTE — Patient Instructions (Signed)
You have been scheduled for an endoscopy. Please follow written instructions given to you at your visit today. If you use inhalers (even only as needed), please bring them with you on the day of your procedure.  Your physician has requested that you go to the basement for the following lab work before leaving today: CBC H-PYLORI  We have sent the following medications to your pharmacy for you to pick up at your convenience: Protonix 40 mg, please take one tablet by mouth twice daily   For pain Tylenol is okay to take DO NOT TAKE Aleve, Ibuprofen, or NSAID's. _______________________________________________________________________________________________________________________________________________________________ Gastroesophageal Reflux Disease, Adult Gastroesophageal reflux disease (GERD) happens when acid from your stomach flows up into the esophagus. When acid comes in contact with the esophagus, the acid causes soreness (inflammation) in the esophagus. Over time, GERD may create small holes (ulcers) in the lining of the esophagus. CAUSES   Increased body weight. This puts pressure on the stomach, making acid rise from the stomach into the esophagus.  Smoking. This increases acid production in the stomach.  Drinking alcohol. This causes decreased pressure in the lower esophageal sphincter (valve or ring of muscle between the esophagus and stomach), allowing acid from the stomach into the esophagus.  Late evening meals and a full stomach. This increases pressure and acid production in the stomach.  A malformed lower esophageal sphincter. Sometimes, no cause is found. SYMPTOMS   Burning pain in the lower part of the mid-chest behind the breastbone and in the mid-stomach area. This may occur twice a week or more often.  Trouble swallowing.  Sore throat.  Dry cough.  Asthma-like symptoms including chest tightness, shortness of breath, or wheezing. DIAGNOSIS  Your caregiver may  be able to diagnose GERD based on your symptoms. In some cases, X-rays and other tests may be done to check for complications or to check the condition of your stomach and esophagus. TREATMENT  Your caregiver may recommend over-the-counter or prescription medicines to help decrease acid production. Ask your caregiver before starting or adding any new medicines.  HOME CARE INSTRUCTIONS   Change the factors that you can control. Ask your caregiver for guidance concerning weight loss, quitting smoking, and alcohol consumption.  Avoid foods and drinks that make your symptoms worse, such as:  Caffeine or alcoholic drinks.  Chocolate.  Peppermint or mint flavorings.  Garlic and onions.  Spicy foods.  Citrus fruits, such as oranges, lemons, or limes.  Tomato-based foods such as sauce, chili, salsa, and pizza.  Fried and fatty foods.  Avoid lying down for the 3 hours prior to your bedtime or prior to taking a nap.  Eat small, frequent meals instead of large meals.  Wear loose-fitting clothing. Do not wear anything tight around your waist that causes pressure on your stomach.  Raise the head of your bed 6 to 8 inches with wood blocks to help you sleep. Extra pillows will not help.  Only take over-the-counter or prescription medicines for pain, discomfort, or fever as directed by your caregiver.  Do not take aspirin, ibuprofen, or other nonsteroidal anti-inflammatory drugs (NSAIDs). SEEK IMMEDIATE MEDICAL CARE IF:   You have pain in your arms, neck, jaw, teeth, or back.  Your pain increases or changes in intensity or duration.  You develop nausea, vomiting, or sweating (diaphoresis).  You develop shortness of breath, or you faint.  Your vomit is green, yellow, black, or looks like coffee grounds or blood.  Your stool is red, bloody, or black. These  symptoms could be signs of other problems, such as heart disease, gastric bleeding, or esophageal bleeding. MAKE SURE YOU:    Understand these instructions.  Will watch your condition.  Will get help right away if you are not doing well or get worse. Document Released: 07/25/2005 Document Revised: 01/07/2012 Document Reviewed: 05/04/2011 [facility] Patient Information 2015 [place], [place]. This information is not intended to replace advice given to you by your health care provider. Make sure you discuss any questions you have with your health care provider.

## 2014-10-25 NOTE — Progress Notes (Signed)
Patient ID: Paula Thompson, female   DOB: 05-01-1971, 43 y.o.   MRN: [id number]   Subjective:    Patient ID: Paula Thompson, female    DOB: 05-01-1971, 43 y.o.   MRN: [id number]  HPI Paula Thompson is a pleasant 43 year old African-American female who was recently hospitalized with an acute upper GI bleed. She had undergone an EGD on 09/26/2014 per Dr. Elnoria HowardHung and was found to have a clean-based antral ulcer on the lesser curve measuring 1.5-2 cm. There was a  small hemocystic spot ,she also had moderate duodenitis. Biopsies were not taken in the face of bleeding. Patient had been taking a lot of ibuprofen for a tooth infection prior to onset of her bleeding. Her hemoglobin at its lowest was about 6.5 she did receive 2 units of packed RBCs during her hospital stay, and hemoglobin on 09/28/2014 was 7.9. Patient was discharged home on twice daily Protonix and comes back in for follow-up today. She says she feels good has not been having any problems with abdominal discomfort nausea vomiting etc. She says her stools are normal in appearance. She has not been taking any ibuprofen since hospitalization H. pylori testing was not done during hospitalization. Patient does report a prior history of anemia and says she was told that her hemoglobin was in the 9 range a couple of months prior to being hospitalized.  Review of Systems Pertinent positive and negative review of systems were noted in the above HPI section.  All other review of systems was otherwise negative.  Outpatient Encounter Prescriptions as of 10/25/2014  Medication Sig  . pantoprazole (PROTONIX) 40 MG tablet Take 1 tablet (40 mg total) by mouth 2 (two) times daily.  . traMADol (ULTRAM) 50 MG tablet Take 1 tablet (50 mg total) by mouth every 6 (six) hours as needed for moderate pain (Tooth pain.).  . [DISCONTINUED] pantoprazole (PROTONIX) 40 MG tablet Take 1 tablet (40 mg total) by mouth 2 (two) times daily.  . [DISCONTINUED] amoxicillin (AMOXIL) 500  MG capsule Take 1 capsule (500 mg total) by mouth 2 (two) times daily.   Allergies  Allergen Reactions  . Amoxicillin Hives   Patient Active Problem List   Diagnosis Date Noted  . Antral ulcer 10/01/2014  . Tooth pain   . Upper GI bleed 09/25/2014  . Acute blood loss anemia 09/25/2014   History   Social History  . Marital Status: Married    Spouse Name: N/A    Number of Children: 2  . Years of Education: N/A   Occupational History  . MAINTENANCE    Social History Main Topics  . Smoking status: Current Every Day Smoker  . Smokeless tobacco: Never Used  . Alcohol Use: 0.0 oz/week    0 Not specified per week  . Drug Use: No  . Sexual Activity: Not on file   Other Topics Concern  . Not on file   Social History Narrative    Paula Thompson's family history includes Cancer in her maternal grandfather; Diabetes in her father; Hyperlipidemia in her brother.      Objective:    Filed Vitals:   10/25/14 0826  BP: 132/76  Pulse: 72    Physical Exam well-developed African-American female in no acute distress, pleasant vitals as above height 5 foot 5 weight 254. HEENT ;nontraumatic normocephalic EOMI PERRLA sclera anicteric, Supple ;no JVD, Cardiovascular; regular rate and rhythm with S1-S2 no murmur or gallop, Pulmonary; clear bilaterally, Abdomen; soft nontender nondistended bowel sounds are active there is  no palpable mass or hepatosplenomegaly, Rectal; exam not done, Extremities; no clubbing cyanosis or edema skin warm and dry, Psych; mood and affect appropriate     Assessment & Plan:   #1 43 yo female with recent upper GI bleed secondary to a 1.5-2 cm antral ulcer, very likely NSAID induced. Patient currently feeling well and asymptomatic #2 severe anemia-secondary to blood loss  Plan; repeat CBC today Check H. pylori antibody Continue  Protonix  40 mg by mouth twice daily for 2 more months Schedule for follow-up EGD with Dr. Russella Thompson in early March to document healing  of the ulcer.  Procedure  discussed in detail with patient and she is agreeable to proceed.   Paula Thompson Paula Hillock PA-C 10/25/2014

## 2014-10-26 ENCOUNTER — Telehealth: Payer: Self-pay | Admitting: Physician Assistant

## 2014-10-26 NOTE — Telephone Encounter (Signed)
Ok- call in  Clarithromycin 500 mg 3 times daily with food x 14 days- she is already on a twice daily PPI

## 2014-10-27 MED ORDER — CLARITHROMYCIN 500 MG PO TABS: ORAL_TABLET | ORAL | Status: AC

## 2014-10-27 NOTE — Telephone Encounter (Signed)
I called and LM for the patient on the mobile phone number.  I advised her we sent a prescription for the Clarithromycin 500 mg , tid  With food for 14 days.

## 2014-10-28 ENCOUNTER — Telehealth: Payer: Self-pay

## 2014-10-28 NOTE — Telephone Encounter (Signed)
Left message for patient. Her labs are back. Medications are called to her pharmacy.Note result note closed accidentally before speaking with the patient. Two messages have been left for her to call back. The recommendations from her provider are as follows:  "Please call pt And let her know her hgb is still very low at 6.7 . She needs to come back next week for a repeat cbc, and also do iron studies at that time . Needs to start on Integra plus one daily x 3 months. H pylori is also +, and since she has an ulcer we need to treat . Please call in a 14 day course of pylera- warn her all of this will make stools dark.Marland Kitchen".

## 2014-11-02 NOTE — Telephone Encounter (Signed)
I have left message for the patient to call back 

## 2014-11-09 ENCOUNTER — Other Ambulatory Visit: Payer: Self-pay

## 2014-11-09 NOTE — Telephone Encounter (Signed)
No answer at listed phone numbers. Letter mailed to the patient. Confirmed with the [facility], the Pylera was not picked up.

## 2014-11-17 NOTE — Telephone Encounter (Signed)
No answer at phone numbers listed. Left message on the "emergency contact" number

## 2014-11-18 ENCOUNTER — Other Ambulatory Visit: Payer: Self-pay

## 2014-11-18 NOTE — Telephone Encounter (Signed)
2nd letter mailed

## 2014-11-25 NOTE — Telephone Encounter (Signed)
Patient has called back. Advised of her lab results. She is unable to afford Pylera ($800) and tetracycline is no longer available. She is allergic to Amoxicillin (rash). Do you want to prescribe Biaxin and Flagyl and leave her on Protonix?

## 2014-11-25 NOTE — Telephone Encounter (Signed)
This patient to see Dr. Russella Dar for follow-up EGD Will defer management to him

## 2014-11-26 NOTE — Telephone Encounter (Signed)
Please message about the Pylera

## 2014-11-28 NOTE — Telephone Encounter (Signed)
Please review with Amy Monica Becton

## 2014-11-29 ENCOUNTER — Other Ambulatory Visit: Payer: Self-pay

## 2014-11-29 MED ORDER — METRONIDAZOLE 500 MG PO TABS: 500.0000 mg | ORAL_TABLET | Freq: Two times a day (BID) | ORAL | Status: DC

## 2014-11-29 MED ORDER — CLARITHROMYCIN 500 MG PO TABS: 500.0000 mg | ORAL_TABLET | Freq: Two times a day (BID) | ORAL | Status: DC

## 2014-11-29 MED ORDER — PANTOPRAZOLE SODIUM 40 MG PO TBEC: 40.0000 mg | DELAYED_RELEASE_TABLET | Freq: Two times a day (BID) | ORAL | Status: DC

## 2014-11-29 NOTE — Telephone Encounter (Signed)
Yes , she can have Biaxin  500 mg bid x 14 days, and flagyl 500 mg bid x 14 days- take with food, and stay on protonix- take bid while on the abx

## 2014-11-29 NOTE — Telephone Encounter (Signed)
Please review my note about 4 notes back. Thank you.

## 2014-12-30 ENCOUNTER — Other Ambulatory Visit (INDEPENDENT_AMBULATORY_CARE_PROVIDER_SITE_OTHER): Payer: Self-pay

## 2014-12-30 ENCOUNTER — Encounter: Payer: Self-pay | Admitting: Gastroenterology

## 2014-12-30 ENCOUNTER — Telehealth: Payer: Self-pay

## 2014-12-30 ENCOUNTER — Ambulatory Visit (AMBULATORY_SURGERY_CENTER): Payer: Self-pay | Admitting: Gastroenterology

## 2014-12-30 VITALS — BP 113/74 | HR 79 | Temp 98.2°F | Resp 15 | Ht 65.0 in | Wt 254.0 lb

## 2014-12-30 DIAGNOSIS — K254 Chronic or unspecified gastric ulcer with hemorrhage: Secondary | ICD-10-CM

## 2014-12-30 DIAGNOSIS — B9681 Helicobacter pylori [H. pylori] as the cause of diseases classified elsewhere: Secondary | ICD-10-CM

## 2014-12-30 DIAGNOSIS — K253 Acute gastric ulcer without hemorrhage or perforation: Secondary | ICD-10-CM

## 2014-12-30 DIAGNOSIS — K2970 Gastritis, unspecified, without bleeding: Secondary | ICD-10-CM

## 2014-12-30 DIAGNOSIS — D509 Iron deficiency anemia, unspecified: Secondary | ICD-10-CM

## 2014-12-30 LAB — CBC WITH DIFFERENTIAL/PLATELET
BASOS ABS: 0.1 10*3/uL (ref 0.0–0.1)
BASOS PCT: 0.6 % (ref 0.0–3.0)
Eosinophils Absolute: 0.3 10*3/uL (ref 0.0–0.7)
Eosinophils Relative: 3.9 % (ref 0.0–5.0)
HCT: 24.2 % — ABNORMAL LOW (ref 36.0–46.0)
LYMPHS PCT: 25 % (ref 12.0–46.0)
Lymphs Abs: 2.2 10*3/uL (ref 0.7–4.0)
MCHC: 28.5 g/dL — ABNORMAL LOW (ref 30.0–36.0)
MCV: 55.8 fl — ABNORMAL LOW (ref 78.0–100.0)
Monocytes Absolute: 0.9 10*3/uL (ref 0.1–1.0)
Monocytes Relative: 10 % (ref 3.0–12.0)
NEUTROS PCT: 60.5 % (ref 43.0–77.0)
Neutro Abs: 5.2 10*3/uL (ref 1.4–7.7)
Platelets: 427 10*3/uL — ABNORMAL HIGH (ref 150.0–400.0)
RBC: 4.33 Mil/uL (ref 3.87–5.11)
RDW: 25.1 % — ABNORMAL HIGH (ref 11.5–15.5)
WBC: 8.7 10*3/uL (ref 4.0–10.5)

## 2014-12-30 LAB — IBC PANEL
Iron: 8 ug/dL — ABNORMAL LOW (ref 42–145)
Saturation Ratios: 1.4 % — ABNORMAL LOW (ref 20.0–50.0)
Transferrin: 404 mg/dL — ABNORMAL HIGH (ref 212.0–360.0)

## 2014-12-30 MED ORDER — FLUCONAZOLE 100 MG PO TABS: 100.0000 mg | ORAL_TABLET | Freq: Every day | ORAL | Status: DC

## 2014-12-30 MED ORDER — SODIUM CHLORIDE 0.9 % IV SOLN: 500.0000 mL | INTRAVENOUS | Status: DC

## 2014-12-30 NOTE — Op Note (Signed)
[facility] [address] [place], [address]   ENDOSCOPY PROCEDURE REPORT  PATIENT: Paula Thompson, Paula Thompson  MR#: [id number] BIRTHDATE: Jul 31, 1971 , 44  yrs. old GENDER: female ENDOSCOPIST: Meryl Dare, MD, Clementeen Graham REFERRED BY:  Dessa Phi, MD PROCEDURE DATE:  12/30/2014 PROCEDURE:  EGD w/ biopsy ASA CLASS:     Class III INDICATIONS:  Follow up gastric ulcer. MEDICATIONS: Monitored anesthesia care, Propofol 200 mg IV, and lidocaine 200 mg IV TOPICAL ANESTHETIC: none DESCRIPTION OF PROCEDURE: After the risks benefits and alternatives of the procedure were thoroughly explained, informed consent was obtained.  The LB [id number] endoscope was introduced through the mouth and advanced to the second portion of the duodenum , Without limitations.  The instrument was slowly withdrawn as the mucosa was fully examined.    STOMACH: Mild gastritis  was found in the gastric antrum. Ulcer has completely healed.  Multiple biopsies were performed.   The stomach otherwise appeared normal. ESOPHAGUS: White exudates consistent with candidiasis were found in the entire esophagus. DUODENUM: The duodenal mucosa showed no abnormalities in the bulb and 2nd part of the duodenum.  Retroflexed views revealed no abnormalities.   The scope was then withdrawn from the patient and the procedure completed. COMPLICATIONS: There were no immediate complications.  ENDOSCOPIC IMPRESSION: 1.   Mild gastritis in the gastric antrum; multiple biopsies performed 2.   White exudates consistent with candidiasis in the entire esophagus  RECOMMENDATIONS: 1.  Continue PPI daily long term 2.  Minimize/avoid ASA/NSAID useage 3.  Await pathology results 4.  Blood work today as ordered 5.  Diflucan 100 mg po daily #7 6.  Follow up appt with PCP in 1 month [ eSigned:  Meryl Dare, MD, [facility] 12/30/2014 8:47 AM

## 2014-12-30 NOTE — Progress Notes (Signed)
Called to room to assist during endoscopic procedure.  Patient ID and intended procedure confirmed with present staff. Received instructions for my participation in the procedure from the performing physician.  

## 2014-12-30 NOTE — Patient Instructions (Signed)
Discharge instructions given. Biopsies taken. Prescription sent in to pharmacy. YOU HAD AN ENDOSCOPIC PROCEDURE TODAY AT THE [facility]:   Refer to the procedure report that was given to you for any specific questions about what was found during the examination.  If the procedure report does not answer your questions, please call your gastroenterologist to clarify.  If you requested that your care partner not be given the details of your procedure findings, then the procedure report has been included in a sealed envelope for you to review at your convenience later.  YOU SHOULD EXPECT: Some feelings of bloating in the abdomen. Passage of more gas than usual.  Walking can help get rid of the air that was put into your GI tract during the procedure and reduce the bloating. If you had a lower endoscopy (such as a colonoscopy or flexible sigmoidoscopy) you may notice spotting of blood in your stool or on the toilet paper. If you underwent a bowel prep for your procedure, you may not have a normal bowel movement for a few days.  Please Note:  You might notice some irritation and congestion in your nose or some drainage.  This is from the oxygen used during your procedure.  There is no need for concern and it should clear up in a day or so.  SYMPTOMS TO REPORT IMMEDIATELY:    Following upper endoscopy (EGD)  Vomiting of blood or coffee ground material  New chest pain or pain under the shoulder blades  Painful or persistently difficult swallowing  New shortness of breath  Fever of 100F or higher  Black, tarry-looking stools  For urgent or emergent issues, a gastroenterologist can be reached at any hour by calling [phone].   DIET: Your first meal following the procedure should be a small meal and then it is ok to progress to your normal diet. Heavy or fried foods are harder to digest and may make you feel nauseous or bloated.  Likewise, meals heavy in dairy and vegetables can  increase bloating.  Drink plenty of fluids but you should avoid alcoholic beverages for 24 hours.  ACTIVITY:  You should plan to take it easy for the rest of today and you should NOT DRIVE or use heavy machinery until tomorrow (because of the sedation medicines used during the test).    FOLLOW UP: Our staff will call the number listed on your records the next business day following your procedure to check on you and address any questions or concerns that you may have regarding the information given to you following your procedure. If we do not reach you, we will leave a message.  However, if you are feeling well and you are not experiencing any problems, there is no need to return our call.  We will assume that you have returned to your regular daily activities without incident.  If any biopsies were taken you will be contacted by phone or by letter within the next 1-3 weeks.  Please call us at [phone] if you have not heard about the biopsies in 3 weeks.    SIGNATURES/CONFIDENTIALITY: You and/or your care partner have signed paperwork which will be entered into your electronic medical record.  These signatures attest to the fact that that the information above on your After Visit Summary has been reviewed and is understood.  Full responsibility of the confidentiality of this discharge information lies with you and/or your care-partner.

## 2014-12-30 NOTE — Telephone Encounter (Signed)
Dr. Russella Dar notified of Hgb today of 6.9.  Dr. Russella Dar aware and this is stable since discharge.  Per Dr. Russella Dar patient needs to start iron 325 mg 1 po BID with meals.  She is to return to her primary care for a CBC within 2 weeks and her primary care to monitor CBC.    Left message for patient to call back

## 2014-12-30 NOTE — Telephone Encounter (Signed)
See additional orders on CBC results from [facility] and Dr. Russella Dar  See CBC for additional details

## 2014-12-30 NOTE — Progress Notes (Signed)
Report to PACU, RN, vss, BBS= Clear.  

## 2014-12-31 ENCOUNTER — Other Ambulatory Visit: Payer: Self-pay

## 2014-12-31 ENCOUNTER — Telehealth: Payer: Self-pay

## 2014-12-31 MED ORDER — INTEGRA PLUS PO CAPS: ORAL_CAPSULE | ORAL | Status: DC

## 2014-12-31 NOTE — Telephone Encounter (Signed)
  Follow up Call-  Call back number 12/30/2014  Post procedure Call Back phone  # [phone]  Permission to leave phone message Yes     Patient questions:  Do you have a fever, pain , or abdominal swelling? No. Pain Score  0 *  Have you tolerated food without any problems? Yes.    Have you been able to return to your normal activities? Yes.    Do you have any questions about your discharge instructions: Diet   No. Medications  No. Follow up visit  No.  Do you have questions or concerns about your Care? No.  Actions: * If pain score is 4 or above: No action needed, pain <4.

## 2015-01-03 ENCOUNTER — Other Ambulatory Visit: Payer: Self-pay

## 2015-01-10 ENCOUNTER — Other Ambulatory Visit: Payer: Self-pay

## 2015-01-10 MED ORDER — BIS SUBCIT-METRONID-TETRACYC 140-125-125 MG PO CAPS: 3.0000 | ORAL_CAPSULE | Freq: Three times a day (TID) | ORAL | Status: DC

## 2015-01-13 ENCOUNTER — Other Ambulatory Visit: Payer: Self-pay

## 2015-01-13 MED ORDER — BIS SUBCIT-METRONID-TETRACYC 140-125-125 MG PO CAPS: 3.0000 | ORAL_CAPSULE | Freq: Three times a day (TID) | ORAL | Status: DC

## 2015-01-24 ENCOUNTER — Ambulatory Visit: Payer: Self-pay

## 2016-02-21 ENCOUNTER — Emergency Department (HOSPITAL_COMMUNITY)
Admission: EM | Admit: 2016-02-21 | Discharge: 2016-02-21 | Disposition: A | Discharge status: Home / Self Care | Payer: Self-pay | Attending: Emergency Medicine | Admitting: Emergency Medicine

## 2016-02-21 ENCOUNTER — Encounter (HOSPITAL_COMMUNITY): Payer: Self-pay | Admitting: Family Medicine

## 2016-02-21 DIAGNOSIS — R1013 Epigastric pain: Secondary | ICD-10-CM | POA: Insufficient documentation

## 2016-02-21 DIAGNOSIS — R079 Chest pain, unspecified: Secondary | ICD-10-CM | POA: Insufficient documentation

## 2016-02-21 DIAGNOSIS — F17210 Nicotine dependence, cigarettes, uncomplicated: Secondary | ICD-10-CM | POA: Insufficient documentation

## 2016-02-21 LAB — I-STAT TROPONIN, ED: TROPONIN I, POC: 0 ng/mL (ref 0.00–0.08)

## 2016-02-21 LAB — CBC
HCT: 29.7 % — ABNORMAL LOW (ref 36.0–46.0)
HEMOGLOBIN: 8.3 g/dL — AB (ref 12.0–15.0)
MCH: 16.4 pg — ABNORMAL LOW (ref 26.0–34.0)
MCHC: 27.9 g/dL — AB (ref 30.0–36.0)
MCV: 58.6 fL — ABNORMAL LOW (ref 78.0–100.0)
Platelets: 251 10*3/uL (ref 150–400)
RBC: 5.07 MIL/uL (ref 3.87–5.11)
RDW: 20.9 % — AB (ref 11.5–15.5)
WBC: 9.6 10*3/uL (ref 4.0–10.5)

## 2016-02-21 LAB — COMPREHENSIVE METABOLIC PANEL
ALBUMIN: 3.6 g/dL (ref 3.5–5.0)
ALK PHOS: 52 U/L (ref 38–126)
ALT: 18 U/L (ref 14–54)
ANION GAP: 9 (ref 5–15)
AST: 19 U/L (ref 15–41)
BILIRUBIN TOTAL: 1 mg/dL (ref 0.3–1.2)
BUN: 9 mg/dL (ref 6–20)
CALCIUM: 9.3 mg/dL (ref 8.9–10.3)
CO2: 22 mmol/L (ref 22–32)
Chloride: 106 mmol/L (ref 101–111)
Creatinine, Ser: 0.63 mg/dL (ref 0.44–1.00)
GFR calc Af Amer: >60 mL/min (ref >60)
GLUCOSE: 113 mg/dL — AB (ref 65–99)
POTASSIUM: 3.7 mmol/L (ref 3.5–5.1)
Sodium: 137 mmol/L (ref 135–145)
TOTAL PROTEIN: 7.3 g/dL (ref 6.5–8.1)

## 2016-02-21 LAB — LIPASE, BLOOD: Lipase: 21 U/L (ref 11–51)

## 2016-02-21 LAB — I-STAT BETA HCG BLOOD, ED (MC, WL, AP ONLY)

## 2016-02-21 NOTE — ED Notes (Signed)
Pt eating [employer] in waiting room.

## 2016-02-21 NOTE — ED Notes (Signed)
Pt here for epigastric pain x 1 week. sts worse over the weekend. sts hx of ulcers and not taking Protonix currently. Denies bleeding. Denies nausea, vomiting. sts some dzziness.

## 2016-02-21 NOTE — ED Notes (Signed)
Pt advising she is going to leave.  She has to go pick up ger grandchild.

## 2016-02-22 ENCOUNTER — Encounter (HOSPITAL_COMMUNITY): Payer: Self-pay | Admitting: Emergency Medicine

## 2016-02-22 ENCOUNTER — Emergency Department (HOSPITAL_COMMUNITY)
Admission: EM | Admit: 2016-02-22 | Discharge: 2016-02-22 | Disposition: A | Discharge status: Home / Self Care | Payer: Self-pay | Attending: Emergency Medicine | Admitting: Emergency Medicine

## 2016-02-22 DIAGNOSIS — K2100 Gastro-esophageal reflux disease with esophagitis, without bleeding: Secondary | ICD-10-CM

## 2016-02-22 DIAGNOSIS — D649 Anemia, unspecified: Secondary | ICD-10-CM | POA: Insufficient documentation

## 2016-02-22 DIAGNOSIS — Z880 Allergy status to penicillin: Secondary | ICD-10-CM | POA: Insufficient documentation

## 2016-02-22 DIAGNOSIS — F17210 Nicotine dependence, cigarettes, uncomplicated: Secondary | ICD-10-CM | POA: Insufficient documentation

## 2016-02-22 DIAGNOSIS — Z79899 Other long term (current) drug therapy: Secondary | ICD-10-CM | POA: Insufficient documentation

## 2016-02-22 MED ORDER — PANTOPRAZOLE SODIUM 40 MG PO TBEC: DELAYED_RELEASE_TABLET | ORAL | Status: DC

## 2016-02-22 MED ORDER — GI COCKTAIL ~~LOC~~
30.0000 mL | Freq: Once | ORAL | Status: AC
  Administered 2016-02-22: 30 mL via ORAL
  Filled 2016-02-22: qty 30

## 2016-02-22 NOTE — ED Notes (Signed)
Pt sts abd pain that is similar to when she had issues with her ulcer in past; pt sts not taking meds

## 2016-02-22 NOTE — ED Provider Notes (Signed)
CSN: [id number]     Arrival date & time 02/22/16  4403 History   First MD Initiated Contact with Patient 02/22/16 1117     Chief Complaint  Patient presents with  . Abdominal Pain     (Consider location/radiation/quality/duration/timing/severity/associated sxs/prior Treatment) Patient is a 45 y.o. female presenting with abdominal pain. The history is provided by the patient. No language interpreter was used.  Abdominal Pain Pain location:  Generalized Pain quality: aching and burning   Pain radiates to:  Epigastric region Pain severity:  Moderate Onset quality:  Gradual Timing:  Constant Progression:  Worsening Chronicity:  New Context: eating and sick contacts   Relieved by:  Nothing Worsened by:  Nothing tried Ineffective treatments:  None tried Associated symptoms: nausea   Pt complains of nausea and indigestion.  Pt is out of her protonix.     Past Medical History  Diagnosis Date  . Gastric ulcer with hemorrhage   . Anemia   . Blood transfusion without reported diagnosis    Past Surgical History  Procedure Laterality Date  . Esophagogastroduodenoscopy N/A 09/26/2014    Procedure: ESOPHAGOGASTRODUODENOSCOPY (EGD);  Surgeon: Theda Belfast, MD;  Location: [facility] ENDOSCOPY;  Service: Endoscopy;  Laterality: N/A;   Family History  Problem Relation Age of Onset  . Diabetes Father   . Hyperlipidemia Brother   . Heart disease Brother   . Cancer Maternal Grandfather   . Heart disease Maternal Grandmother   . Lung cancer Maternal Grandfather    Social History  Substance Use Topics  . Smoking status: Current Every Day Smoker    Types: Cigarettes  . Smokeless tobacco: Never Used  . Alcohol Use: 0.0 oz/week    0 Standard drinks or equivalent per week   OB History    No data available     Review of Systems  Gastrointestinal: Positive for nausea and abdominal pain.  All other systems reviewed and are negative.     Allergies  Amoxicillin  Home Medications    Prior to Admission medications   Medication Sig Start Date End Date Taking? Authorizing Provider  bismuth-metronidazole-tetracycline (PYLERA) [phone] MG per capsule Take 3 capsules by mouth 4 (four) times daily -  before meals and at bedtime. 01/10/15   Meryl Dare, MD  bismuth-metronidazole-tetracycline [facility]) [phone] MG per capsule Take 3 capsules by mouth 4 (four) times daily -  before meals and at bedtime. 01/13/15   Meryl Dare, MD  FeFum-FePoly-FA-B Cmp-C-Biot (INTEGRA PLUS) CAPS Take one po daily 12/31/14   Amy S Esterwood, PA-C  fluconazole (DIFLUCAN) 100 MG tablet Take 1 tablet (100 mg total) by mouth daily. 12/30/14   Meryl Dare, MD  pantoprazole (PROTONIX) 40 MG tablet One po bid 02/22/16   Elson Areas, PA-C  traMADol (ULTRAM) 50 MG tablet Take 1 tablet (50 mg total) by mouth every 6 (six) hours as needed for moderate pain (Tooth pain.). 10/01/14   Josalyn Funches, MD   BP 149/97 mmHg  Pulse 95  Temp(Src) 98.7 F (37.1 C) (Oral)  Resp 19  Ht 5\' 5"  (1.651 m)  Wt 113.036 kg  BMI 41.47 kg/m2  SpO2 96%  LMP 01/31/2016 Physical Exam  Constitutional: She appears well-developed and well-nourished.  HENT:  Head: Normocephalic and atraumatic.  Eyes: Conjunctivae and EOM are normal. Pupils are equal, round, and reactive to light.  Neck: Normal range of motion. Neck supple.  Cardiovascular: Normal rate.   Pulmonary/Chest: Effort normal.  Abdominal: Soft.  Musculoskeletal: Normal range of motion.  Neurological: She is alert.  Skin: Skin is warm.  Nursing note and vitals reviewed.   ED Course  Procedures (including critical care time) Labs Review Labs Reviewed - No data to display  Imaging Review No results found. I have personally reviewed and evaluated these images and lab results as part of my medical decision-making.   EKG Interpretation None      MDM   Pt had labs yesterday.  Pt did not wait for results.   Today pt has same complinats.   Hemocult is negative.  Pt given Gi cocktail here.  Pt given rx for protonix.   Pt advised to see her primary Md for recheck as scheduled.   Final diagnoses:  Gastroesophageal reflux disease with esophagitis        Elson Areas, PA-C 02/22/16 1601  Mancel Bale, MD 02/22/16 [phone]

## 2016-02-22 NOTE — Discharge Instructions (Signed)
Esophagitis °Esophagitis is inflammation of the esophagus. The esophagus is the tube that carries food and liquids from your mouth to your stomach. Esophagitis can cause soreness or pain in the esophagus. This condition can make it difficult and painful to swallow.  °CAUSES °Most causes of esophagitis are not serious. Common causes of this condition include: °· Gastroesophageal reflux disease (GERD). This is when stomach contents move back up into the esophagus (reflux). °· Repeated vomiting. °· An allergic-type reaction, especially caused by food allergies (eosinophilic esophagitis). °· Injury to the esophagus by swallowing large pills with or without water, or swallowing certain types of medicines. °· Swallowing (ingesting) harmful chemicals, such as household cleaning products. °· Heavy alcohol use. °· An infection of the esophagus. This most often occurs in people who have a weakened immune system. °· Radiation or chemotherapy treatment for cancer. °· Certain diseases such as sarcoidosis, Crohn disease, and scleroderma. °SYMPTOMS °Symptoms of this condition include: °· Difficult or painful swallowing. °· Pain with swallowing acidic liquids, such as citrus juices. °· Pain with burping. °· Chest pain. °· Difficulty breathing. °· Nausea. °· Vomiting. °· Pain in the abdomen. °· Weight loss. °· Ulcers in the mouth. °· Patches of white material in the mouth (candidiasis). °· Fever. °· Coughing up blood or vomiting blood. °· Stool that is black, tarry, or bright red. °DIAGNOSIS °Your health care provider will take a medical history and perform a physical exam. You may also have other tests, including: °· An endoscopy to examine your stomach and esophagus with a small camera. °· A test that measures the acidity level in your esophagus. °· A test that measures how much pressure is on your esophagus. °· A barium swallow or modified barium swallow to show the shape, size, and functioning of your esophagus. °· Allergy  tests. °TREATMENT °Treatment for this condition depends on the cause of your esophagitis. In some cases, steroids or other medicines may be given to help relieve your symptoms or to treat the underlying cause of your condition. You may have to make some lifestyle changes, such as: °· Avoiding alcohol. °· Quitting smoking. °· Changing your diet. °· Exercising. °· Changing your sleep habits and your sleep environment. °HOME CARE INSTRUCTIONS °Take these actions to decrease your discomfort and to help avoid complications. °Diet °· Follow a diet as recommended by your health care provider. This may involve avoiding foods and drinks such as: °¨ Coffee and tea (with or without caffeine). °¨ Drinks that contain alcohol. °¨ Energy drinks and sports drinks. °¨ Carbonated drinks or sodas. °¨ Chocolate and cocoa. °¨ Peppermint and mint flavorings. °¨ Garlic and onions. °¨ Horseradish. °¨ Spicy and acidic foods, including peppers, chili powder, curry powder, vinegar, hot sauces, and barbecue sauce. °¨ Citrus fruit juices and citrus fruits, such as oranges, lemons, and limes. °¨ Tomato-based foods, such as red sauce, chili, salsa, and pizza with red sauce. °¨ Fried and fatty foods, such as donuts, french fries, potato chips, and high-fat dressings. °¨ High-fat meats, such as hot dogs and fatty cuts of red and white meats, such as rib eye steak, sausage, ham, and bacon. °¨ High-fat dairy items, such as whole milk, butter, and cream cheese. °· Eat small, frequent meals instead of large meals. °· Avoid drinking large amounts of liquid with your meals. °· Avoid eating meals during the 2-3 hours before bedtime. °· Avoid lying down right after you eat. °· Do not exercise right after you eat. °· Avoid foods and drinks that seem to   make your symptoms worse. °General Instructions °· Pay attention to any changes in your symptoms. °· Take over-the-counter and prescription medicines only as told by your health care provider. Do not take  aspirin, ibuprofen, or other NSAIDs unless your health care provider told you to do so. °· If you have trouble taking pills, use a pill splitter to decrease the size of the pill. This will decrease the chance of the pill getting stuck or injuring your esophagus on the way down. Also, drink water after you take a pill. °· Do not use any tobacco products, including cigarettes, chewing tobacco, and e-cigarettes. If you need help quitting, ask your health care provider. °· Wear loose-fitting clothing. Do not wear anything tight around your waist that causes pressure on your abdomen. °· Raise (elevate) the head of your bed about 6 inches (15 cm). °· Try to reduce your stress, such as with yoga or meditation. If you need help reducing stress, ask your health care provider. °· If you are overweight, reduce your weight to an amount that is healthy for you. Ask your health care provider for guidance about a safe weight loss goal. °· Keep all follow-up visits as told by your health care provider. This is important. °SEEK MEDICAL CARE IF: °· You have new symptoms. °· You have unexplained weight loss. °· You have difficulty swallowing, or it hurts to swallow. °· You have wheezing or a persistent cough. °· Your symptoms do not improve with treatment. °· You have frequent heartburn for more than two weeks. °SEEK IMMEDIATE MEDICAL CARE IF: °· You have severe pain in your arms, neck, jaw, teeth, or back. °· You feel sweaty, dizzy, or light-headed. °· You have chest pain or shortness of breath. °· You vomit and your vomit looks like blood or coffee grounds. °· Your stool is bloody or black. °· You have a fever. °· You cannot swallow, drink, or eat. °  °This information is not intended to replace advice given to you by your health care provider. Make sure you discuss any questions you have with your health care provider. °  °Document Released: 11/22/2004 Document Revised: 07/06/2015 Document Reviewed: 02/09/2015 °Elsevier Interactive  Patient Education ©2016 [employer] ° °

## 2016-04-26 ENCOUNTER — Ambulatory Visit (HOSPITAL_COMMUNITY)
Admission: EM | Admit: 2016-04-26 | Discharge: 2016-04-26 | Disposition: A | Discharge status: Home / Self Care | Payer: Self-pay | Attending: Emergency Medicine | Admitting: Emergency Medicine

## 2016-04-26 ENCOUNTER — Encounter (HOSPITAL_COMMUNITY): Payer: Self-pay | Admitting: Emergency Medicine

## 2016-04-26 DIAGNOSIS — B9789 Other viral agents as the cause of diseases classified elsewhere: Principal | ICD-10-CM

## 2016-04-26 DIAGNOSIS — H6502 Acute serous otitis media, left ear: Secondary | ICD-10-CM

## 2016-04-26 DIAGNOSIS — J069 Acute upper respiratory infection, unspecified: Secondary | ICD-10-CM

## 2016-04-26 MED ORDER — FLUTICASONE PROPIONATE 50 MCG/ACT NA SUSP: 2.0000 | Freq: Every day | NASAL | Status: DC

## 2016-04-26 MED ORDER — AZITHROMYCIN 250 MG PO TABS: ORAL_TABLET | ORAL | Status: DC

## 2016-04-26 NOTE — Discharge Instructions (Signed)
You have a cold virus. I am worried about a developing an ear infection. Take azithromycin as prescribed. Use the Flonase daily for the next week. Follow-up as needed.

## 2016-04-26 NOTE — ED Notes (Signed)
Patient reports a 3 day history of right side of throat pain, right ear pain, right side of head congestion.  Denies fever, patient unsure.  Patient denies chills.  Blowing thick, yellow from nose

## 2016-04-26 NOTE — ED Provider Notes (Signed)
CSN: [id number]     Arrival date & time 04/26/16  1602 History   First MD Initiated Contact with Patient 04/26/16 1633     Chief Complaint  Patient presents with  . URI   (Consider location/radiation/quality/duration/timing/severity/associated sxs/prior Treatment) HPI She is a 45 year old woman here for evaluation of nasal congestion.  She reports a three-day history of nasal congestion, rhinorrhea, and cough. She reports getting yellow mucus from her nose. She also reports a slight right-sided earache and sore throat over the last day. No known fevers. No nausea or vomiting, but she does report a decreased appetite. No shortness of breath or chest pain. She's been taking Alka-Seltzer cold and sinus without much improvement.  She did have a gastric ulcer one year ago that required an ICU admission. She is taking Protonix currently.  Past Medical History  Diagnosis Date  . Gastric ulcer with hemorrhage   . Anemia   . Blood transfusion without reported diagnosis    Past Surgical History  Procedure Laterality Date  . Esophagogastroduodenoscopy N/A 09/26/2014    Procedure: ESOPHAGOGASTRODUODENOSCOPY (EGD);  Surgeon: Theda Belfast, MD;  Location: [facility] ENDOSCOPY;  Service: Endoscopy;  Laterality: N/A;   Family History  Problem Relation Age of Onset  . Diabetes Father   . Hyperlipidemia Brother   . Heart disease Brother   . Cancer Maternal Grandfather   . Heart disease Maternal Grandmother   . Lung cancer Maternal Grandfather    Social History  Substance Use Topics  . Smoking status: Current Every Day Smoker    Types: Cigarettes  . Smokeless tobacco: Never Used  . Alcohol Use: 0.0 oz/week    0 Standard drinks or equivalent per week   OB History    No data available     Review of Systems As in history of present illness Allergies  Amoxicillin  Home Medications   Prior to Admission medications   Medication Sig Start Date End Date Taking? Authorizing Provider   Chlorphen-Phenyleph-ASA (ALKA-SELTZER PLUS COLD PO) Take by mouth.   Yes Historical Provider, MD  pantoprazole (PROTONIX) 40 MG tablet One po bid 02/22/16  Yes Lonia Skinner Sofia, PA-C  azithromycin (ZITHROMAX Z-PAK) 250 MG tablet Take 2 pills today, then 1 pill daily until gone. 04/26/16   Charm Rings, MD  FeFum-FePoly-FA-B Cmp-C-Biot (INTEGRA PLUS) CAPS Take one po daily 12/31/14   Amy S Esterwood, PA-C  fluticasone (FLONASE) 50 MCG/ACT nasal spray Place 2 sprays into both nostrils daily. 04/26/16   Charm Rings, MD  traMADol (ULTRAM) 50 MG tablet Take 1 tablet (50 mg total) by mouth every 6 (six) hours as needed for moderate pain (Tooth pain.). 10/01/14   Dessa Phi, MD   Meds Ordered and Administered this Visit  Medications - No data to display  BP 145/85 mmHg  Pulse 93  Temp(Src) 98.2 F (36.8 C) (Oral)  Resp 22  SpO2 99%  LMP 03/26/2016 No data found.   Physical Exam  Constitutional: She is oriented to person, place, and time. She appears well-developed and well-nourished. No distress.  HENT:  Mouth/Throat: No oropharyngeal exudate.  Mild oropharyngeal erythema. Right TM is normal. Left TM with a serous effusion. Nasal mucosa is normal.  Neck: Neck supple. Thyromegaly present.  Cardiovascular: Normal rate and normal heart sounds.   No murmur heard. Pulmonary/Chest: Effort normal and breath sounds normal. No respiratory distress. She has no wheezes. She has no rales.  Lymphadenopathy:    She has no cervical adenopathy.  Neurological: She is  alert and oriented to person, place, and time.    ED Course  Procedures (including critical care time)  Labs Review Labs Reviewed - No data to display  Imaging Review No results found.   MDM   1. Viral URI with cough   2. Acute serous otitis media of left ear, recurrence not specified    We'll cover with azithromycin for early ear infection. Flonase nasal spray to help with congestion and sinus drainage. We'll avoid oral  prednisone given history of gastric ulcer. Return precautions reviewed.    Charm Rings, MD 04/26/16 [phone]

## 2016-05-30 ENCOUNTER — Ambulatory Visit (HOSPITAL_COMMUNITY)
Admission: EM | Admit: 2016-05-30 | Discharge: 2016-05-30 | Disposition: A | Discharge status: Home / Self Care | Payer: Self-pay | Attending: Family Medicine | Admitting: Family Medicine

## 2016-05-30 ENCOUNTER — Encounter (HOSPITAL_COMMUNITY): Payer: Self-pay | Admitting: Emergency Medicine

## 2016-05-30 DIAGNOSIS — R109 Unspecified abdominal pain: Secondary | ICD-10-CM

## 2016-05-30 DIAGNOSIS — Z760 Encounter for issue of repeat prescription: Secondary | ICD-10-CM

## 2016-05-30 MED ORDER — PANTOPRAZOLE SODIUM 40 MG PO TBEC: 40.0000 mg | DELAYED_RELEASE_TABLET | Freq: Every day | ORAL | 3 refills | Status: DC

## 2016-05-30 NOTE — ED Triage Notes (Signed)
The patient presented to the [facility] with a complaint of needing a medication refill on her Protonix.

## 2016-05-30 NOTE — ED Provider Notes (Signed)
[facility]    CSN: [id number] Arrival date & time: 05/30/16  1610  First Provider Contact:  First MD Initiated Contact with Patient 05/30/16 1647        History   Chief Complaint Chief Complaint  Patient presents with  . Medication Refill    HPI Paula Thompson is a 45 y.o. female.    Abdominal Pain  Pain location:  Epigastric Pain quality: burning   Pain radiates to:  Does not radiate Pain severity:  No pain Duration:  3 days Progression:  Unchanged Chronicity:  Chronic Context comment:  H/o gi bleed and put on protonix. no lmd but told to continue med, currently no sx since last dose on sun. Relieved by:  None tried Worsened by:  Nothing Ineffective treatments:  None tried Associated symptoms: no diarrhea, no melena, no nausea and no vomiting     Past Medical History:  Diagnosis Date  . Anemia   . Blood transfusion without reported diagnosis   . Gastric ulcer with hemorrhage     Patient Active Problem List   Diagnosis Date Noted  . Antral ulcer 10/01/2014  . Tooth pain   . Upper GI bleed 09/25/2014  . Acute blood loss anemia 09/25/2014    Past Surgical History:  Procedure Laterality Date  . ESOPHAGOGASTRODUODENOSCOPY N/A 09/26/2014   Procedure: ESOPHAGOGASTRODUODENOSCOPY (EGD);  Surgeon: Theda Belfast, MD;  Location: [facility] ENDOSCOPY;  Service: Endoscopy;  Laterality: N/A;    OB History    No data available       Home Medications    Prior to Admission medications   Medication Sig Start Date End Date Taking? Authorizing Provider  pantoprazole (PROTONIX) 40 MG tablet One po bid 02/22/16  Yes Lonia Skinner Sofia, PA-C  azithromycin (ZITHROMAX Z-PAK) 250 MG tablet Take 2 pills today, then 1 pill daily until gone. 04/26/16   Charm Rings, MD  Chlorphen-Phenyleph-ASA (ALKA-SELTZER PLUS COLD PO) Take by mouth.    Historical Provider, MD  FeFum-FePoly-FA-B Cmp-C-Biot (INTEGRA PLUS) CAPS Take one po daily 12/31/14   Amy S Esterwood, PA-C  fluticasone  (FLONASE) 50 MCG/ACT nasal spray Place 2 sprays into both nostrils daily. 04/26/16   Charm Rings, MD  traMADol (ULTRAM) 50 MG tablet Take 1 tablet (50 mg total) by mouth every 6 (six) hours as needed for moderate pain (Tooth pain.). 10/01/14   Dessa Phi, MD    Family History Family History  Problem Relation Age of Onset  . Diabetes Father   . Hyperlipidemia Brother   . Heart disease Brother   . Cancer Maternal Grandfather   . Heart disease Maternal Grandmother   . Lung cancer Maternal Grandfather     Social History Social History  Substance Use Topics  . Smoking status: Current Every Day Smoker    Types: Cigarettes  . Smokeless tobacco: Never Used  . Alcohol use 0.0 oz/week     Allergies   Amoxicillin   Review of Systems Review of Systems  Constitutional: Negative.   Gastrointestinal: Positive for abdominal pain. Negative for blood in stool, diarrhea, melena, nausea, rectal pain and vomiting.  Genitourinary: Negative.   All other systems reviewed and are negative.    Physical Exam Triage Vital Signs ED Triage Vitals  Enc Vitals Group     BP 05/30/16 1632 132/74     Pulse Rate 05/30/16 1632 87     Resp 05/30/16 1632 18     Temp 05/30/16 1632 98.5 F (36.9 C)  Temp Source 05/30/16 1632 Oral     SpO2 05/30/16 1632 98 %     Weight 05/30/16 1632 216 lb (98 kg)     Height 05/30/16 1632 5\' 5"  (1.651 m)     Head Circumference --      Peak Flow --      Pain Score 05/30/16 1641 0     Pain Loc --      Pain Edu? --      Excl. in GC? --    No data found.   Updated Vital Signs BP 132/74 (BP Location: Right Arm)   Pulse 87   Temp 98.5 F (36.9 C) (Oral)   Resp 18   Ht 5\' 5"  (1.651 m)   Wt 216 lb (98 kg)   LMP 05/01/2016 (Exact Date)   SpO2 98%   BMI 35.94 kg/m   Visual Acuity Right Eye Distance:   Left Eye Distance:   Bilateral Distance:    Right Eye Near:   Left Eye Near:    Bilateral Near:     Physical Exam  Constitutional: She appears  well-developed and well-nourished. No distress.  Abdominal: Soft. Bowel sounds are normal. She exhibits no distension and no mass. There is no tenderness. There is no rebound and no guarding. No hernia.  Skin: Skin is warm and dry.  Nursing note and vitals reviewed.    UC Treatments / Results  Labs (all labs ordered are listed, but only abnormal results are displayed) Labs Reviewed - No data to display  EKG  EKG Interpretation None       Radiology No results found.  Procedures Procedures (including critical care time)  Medications Ordered in UC Medications - No data to display   Initial Impression / Assessment and Plan / UC Course  I have reviewed the triage vital signs and the nursing notes.  Pertinent labs & imaging results that were available during my care of the patient were reviewed by me and considered in my medical decision making (see chart for details).  Clinical Course      Final Clinical Impressions(s) / UC Diagnoses   Final diagnoses:  None    New Prescriptions New Prescriptions   No medications on file     Linna Hoff, MD 05/30/16 8022

## 2016-12-26 ENCOUNTER — Encounter: Payer: Self-pay | Admitting: Emergency Medicine

## 2016-12-26 ENCOUNTER — Ambulatory Visit (HOSPITAL_COMMUNITY)
Admission: EM | Admit: 2016-12-26 | Discharge: 2016-12-26 | Disposition: A | Discharge status: Home / Self Care | Payer: Self-pay | Attending: Family Medicine | Admitting: Family Medicine

## 2016-12-26 DIAGNOSIS — M7580 Other shoulder lesions, unspecified shoulder: Secondary | ICD-10-CM

## 2016-12-26 DIAGNOSIS — M778 Other enthesopathies, not elsewhere classified: Secondary | ICD-10-CM

## 2016-12-26 MED ORDER — METHYLPREDNISOLONE ACETATE 80 MG/ML IJ SUSP
80.0000 mg | Freq: Once | INTRAMUSCULAR | Status: AC
  Administered 2016-12-26: 80 mg via INTRAMUSCULAR

## 2016-12-26 MED ORDER — MELOXICAM 7.5 MG PO TABS: 7.5000 mg | ORAL_TABLET | Freq: Two times a day (BID) | ORAL | 1 refills | Status: DC

## 2016-12-26 MED ORDER — METHYLPREDNISOLONE ACETATE 80 MG/ML IJ SUSP
INTRAMUSCULAR | Status: AC
Start: 2016-12-26 — End: 2016-12-26
  Filled 2016-12-26: qty 1

## 2016-12-26 NOTE — ED Provider Notes (Signed)
[facility]    CSN: [id number] Arrival date & time: 12/26/16  1208     History   Chief Complaint Chief Complaint  Patient presents with  . Shoulder Pain    HPI Paula Thompson is a 46 y.o. female.   The history is provided by the patient.  Shoulder Pain  Location:  Shoulder Shoulder location:  L shoulder and R shoulder Injury: no   Pain details:    Quality:  Sharp   Radiates to:  Does not radiate   Severity:  Moderate   Onset quality:  Gradual   Duration:  6 months   Progression:  Worsening (works at [employer] Dislocation: no   Prior injury to area:  No Relieved by:  None tried Worsened by:  Nothing Ineffective treatments:  None tried Associated symptoms: decreased range of motion   Associated symptoms: no neck pain     Past Medical History:  Diagnosis Date  . Anemia   . Blood transfusion without reported diagnosis   . Gastric ulcer with hemorrhage     Patient Active Problem List   Diagnosis Date Noted  . Antral ulcer 10/01/2014  . Tooth pain   . Upper GI bleed 09/25/2014  . Acute blood loss anemia 09/25/2014    Past Surgical History:  Procedure Laterality Date  . ESOPHAGOGASTRODUODENOSCOPY N/A 09/26/2014   Procedure: ESOPHAGOGASTRODUODENOSCOPY (EGD);  Surgeon: Theda Belfast, MD;  Location: [facility] ENDOSCOPY;  Service: Endoscopy;  Laterality: N/A;    OB History    No data available       Home Medications    Prior to Admission medications   Medication Sig Start Date End Date Taking? Authorizing Provider  pantoprazole (PROTONIX) 40 MG tablet Take 1 tablet (40 mg total) by mouth daily. 05/30/16  Yes Linna Hoff, MD  azithromycin (ZITHROMAX Z-PAK) 250 MG tablet Take 2 pills today, then 1 pill daily until gone. 04/26/16   Charm Rings, MD  Chlorphen-Phenyleph-ASA (ALKA-SELTZER PLUS COLD PO) Take by mouth.    Historical Provider, MD  FeFum-FePoly-FA-B Cmp-C-Biot (INTEGRA PLUS) CAPS Take one po daily 12/31/14   Amy S Esterwood, PA-C    fluticasone (FLONASE) 50 MCG/ACT nasal spray Place 2 sprays into both nostrils daily. 04/26/16   Charm Rings, MD  meloxicam (MOBIC) 7.5 MG tablet Take 1 tablet (7.5 mg total) by mouth 2 (two) times daily after a meal. 12/26/16   Linna Hoff, MD  traMADol (ULTRAM) 50 MG tablet Take 1 tablet (50 mg total) by mouth every 6 (six) hours as needed for moderate pain (Tooth pain.). 10/01/14   Dessa Phi, MD    Family History Family History  Problem Relation Age of Onset  . Diabetes Father   . Hyperlipidemia Brother   . Heart disease Brother   . Cancer Maternal Grandfather   . Heart disease Maternal Grandmother   . Lung cancer Maternal Grandfather     Social History Social History  Substance Use Topics  . Smoking status: Current Every Day Smoker    Types: Cigarettes  . Smokeless tobacco: Never Used  . Alcohol use 0.0 oz/week     Allergies   Amoxicillin   Review of Systems Review of Systems  Genitourinary: Negative.   Musculoskeletal: Positive for arthralgias and joint swelling. Negative for neck pain.  Neurological: Negative.   All other systems reviewed and are negative.    Physical Exam Triage Vital Signs ED Triage Vitals [12/26/16 1212]  Enc Vitals Group  BP 122/67     Pulse Rate 101     Resp 16     Temp 97.9 F (36.6 C)     Temp Source Oral     SpO2 99 %     Weight      Height      Head Circumference      Peak Flow      Pain Score 9     Pain Loc      Pain Edu?      Excl. in GC?    No data found.   Updated Vital Signs BP 122/67 (BP Location: Left Arm)   Pulse 101   Temp 97.9 F (36.6 C) (Oral)   Resp 16   SpO2 99%   Visual Acuity Right Eye Distance:   Left Eye Distance:   Bilateral Distance:    Right Eye Near:   Left Eye Near:    Bilateral Near:     Physical Exam  Constitutional: She is oriented to person, place, and time. She appears well-developed and well-nourished.  Musculoskeletal: She exhibits tenderness.       Right  shoulder: She exhibits decreased range of motion, tenderness, bony tenderness and decreased strength. She exhibits no crepitus.       Left shoulder: She exhibits decreased range of motion, tenderness, bony tenderness, pain and decreased strength. She exhibits no crepitus and normal pulse.  Neurological: She is alert and oriented to person, place, and time.  Skin: Skin is warm and dry.  Nursing note and vitals reviewed.    UC Treatments / Results  Labs (all labs ordered are listed, but only abnormal results are displayed) Labs Reviewed - No data to display  EKG  EKG Interpretation None       Radiology No results found.  Procedures Procedures (including critical care time)  Medications Ordered in UC Medications  methylPREDNISolone acetate (DEPO-MEDROL) injection 80 mg (80 mg Intramuscular Given 12/26/16 1250)     Initial Impression / Assessment and Plan / UC Course  I have reviewed the triage vital signs and the nursing notes.  Pertinent labs & imaging results that were available during my care of the patient were reviewed by me and considered in my medical decision making (see chart for details).       Final Clinical Impressions(s) / UC Diagnoses   Final diagnoses:  Tendinitis of shoulder, unspecified laterality    New Prescriptions Discharge Medication List as of 12/26/2016 12:55 PM    START taking these medications   Details  meloxicam (MOBIC) 7.5 MG tablet Take 1 tablet (7.5 mg total) by mouth 2 (two) times daily after a meal., Starting Wed 12/26/2016, Print         Linna Hoff, MD 12/26/16 1259

## 2016-12-26 NOTE — ED Notes (Signed)
Bed: UC01 Expected date:  Expected time:  Means of arrival:  Comments: Hold until 12

## 2016-12-26 NOTE — ED Triage Notes (Signed)
C/o bilateral shoulder pain States she is not able to lift arms

## 2017-02-01 ENCOUNTER — Encounter (HOSPITAL_COMMUNITY): Payer: Self-pay | Admitting: Emergency Medicine

## 2017-02-01 ENCOUNTER — Ambulatory Visit (HOSPITAL_COMMUNITY)
Admission: EM | Admit: 2017-02-01 | Discharge: 2017-02-01 | Disposition: A | Discharge status: Home / Self Care | Payer: Self-pay | Attending: Internal Medicine | Admitting: Internal Medicine

## 2017-02-01 DIAGNOSIS — K2100 Gastro-esophageal reflux disease with esophagitis, without bleeding: Secondary | ICD-10-CM

## 2017-02-01 MED ORDER — PANTOPRAZOLE SODIUM 40 MG PO TBEC: 40.0000 mg | DELAYED_RELEASE_TABLET | Freq: Every day | ORAL | 3 refills | Status: DC

## 2017-02-01 NOTE — ED Triage Notes (Signed)
Here for abd pain onset 1 month +++   States she has hx of gastric ulcers and ran out of Protonix 40 mg  Would like refill on meds  Sx also include GERD  Denies v/n/d, fevers, urinary sx  A&O x4... NAD

## 2017-02-04 NOTE — ED Provider Notes (Signed)
CSN: [id number]     Arrival date & time 02/01/17  1128 History   None    Chief Complaint  Patient presents with  . Abdominal Pain   (Consider location/radiation/quality/duration/timing/severity/associated sxs/prior Treatment) Patient is here for refills on her Protonix.  She is awaiting an appointment to get established with PCP.   The history is provided by the patient.  Abdominal Pain  Pain location:  Periumbilical Pain quality: dull   Pain radiates to:  Does not radiate Pain severity:  Mild Onset quality:  Gradual Duration:  1 week Timing:  Constant Progression:  Worsening Chronicity:  New Relieved by:  Nothing Worsened by:  Nothing Ineffective treatments:  None tried   Past Medical History:  Diagnosis Date  . Anemia   . Blood transfusion without reported diagnosis   . Gastric ulcer with hemorrhage    Past Surgical History:  Procedure Laterality Date  . ESOPHAGOGASTRODUODENOSCOPY N/A 09/26/2014   Procedure: ESOPHAGOGASTRODUODENOSCOPY (EGD);  Surgeon: Theda Belfast, MD;  Location: [facility] ENDOSCOPY;  Service: Endoscopy;  Laterality: N/A;   Family History  Problem Relation Age of Onset  . Diabetes Father   . Hyperlipidemia Brother   . Heart disease Brother   . Cancer Maternal Grandfather   . Lung cancer Maternal Grandfather   . Heart disease Maternal Grandmother    Social History  Substance Use Topics  . Smoking status: Current Every Day Smoker    Types: Cigarettes  . Smokeless tobacco: Never Used  . Alcohol use 0.0 oz/week   OB History    No data available     Review of Systems  Constitutional: Negative.   HENT: Negative.   Eyes: Negative.   Respiratory: Negative.   Cardiovascular: Negative.   Gastrointestinal: Positive for abdominal pain.  Endocrine: Negative.   Genitourinary: Negative.   Musculoskeletal: Negative.   Skin: Negative.   Allergic/Immunologic: Negative.   Neurological: Negative.   Hematological: Negative.   Psychiatric/Behavioral:  Negative.     Allergies  Amoxicillin  Home Medications   Prior to Admission medications   Medication Sig Start Date End Date Taking? Authorizing Provider  azithromycin (ZITHROMAX Z-PAK) 250 MG tablet Take 2 pills today, then 1 pill daily until gone. 04/26/16   Charm Rings, MD  Chlorphen-Phenyleph-ASA (ALKA-SELTZER PLUS COLD PO) Take by mouth.    Historical Provider, MD  FeFum-FePoly-FA-B Cmp-C-Biot (INTEGRA PLUS) CAPS Take one po daily 12/31/14   Amy S Esterwood, PA-C  fluticasone (FLONASE) 50 MCG/ACT nasal spray Place 2 sprays into both nostrils daily. 04/26/16   Charm Rings, MD  meloxicam (MOBIC) 7.5 MG tablet Take 1 tablet (7.5 mg total) by mouth 2 (two) times daily after a meal. 12/26/16   Linna Hoff, MD  pantoprazole (PROTONIX) 40 MG tablet Take 1 tablet (40 mg total) by mouth daily. 02/01/17   Deatra Canter, FNP  pantoprazole (PROTONIX) 40 MG tablet Take 1 tablet (40 mg total) by mouth daily. 02/01/17   Deatra Canter, FNP  traMADol (ULTRAM) 50 MG tablet Take 1 tablet (50 mg total) by mouth every 6 (six) hours as needed for moderate pain (Tooth pain.). 10/01/14   Dessa Phi, MD   Meds Ordered and Administered this Visit  Medications - No data to display  BP (!) 143/80 (BP Location: Left Arm)   Pulse 96   Temp 97.7 F (36.5 C) (Oral)   Resp 18   LMP 01/30/2017   SpO2 97%  No data found.   Physical Exam  Constitutional: She appears  well-developed and well-nourished.  HENT:  Head: Normocephalic and atraumatic.  Eyes: Conjunctivae and EOM are normal. Pupils are equal, round, and reactive to light.  Neck: Normal range of motion. Neck supple.  Cardiovascular: Normal rate, regular rhythm and normal heart sounds.   Pulmonary/Chest: Effort normal and breath sounds normal.  Abdominal: Soft. Bowel sounds are normal.  Musculoskeletal: Normal range of motion.  Skin: Skin is warm.  Nursing note and vitals reviewed.   Urgent Care Course     Procedures (including critical  care time)  Labs Review Labs Reviewed - No data to display  Imaging Review No results found.   Visual Acuity Review  Right Eye Distance:   Left Eye Distance:   Bilateral Distance:    Right Eye Near:   Left Eye Near:    Bilateral Near:         MDM   1. Gastroesophageal reflux disease with esophagitis    Protonix 40mg  one po qd #30w/1rf      Deatra Canter, FNP 02/04/17 1014    Anselm Pancoast [place], [place] 02/04/17 1027

## 2019-07-09 ENCOUNTER — Emergency Department (HOSPITAL_COMMUNITY): Payer: Self-pay

## 2019-07-09 ENCOUNTER — Emergency Department (HOSPITAL_COMMUNITY)
Admission: EM | Admit: 2019-07-09 | Discharge: 2019-07-10 | Disposition: A | Discharge status: Home / Self Care | Payer: Self-pay | Attending: Emergency Medicine | Admitting: Emergency Medicine

## 2019-07-09 ENCOUNTER — Encounter (HOSPITAL_COMMUNITY): Payer: Self-pay | Admitting: Emergency Medicine

## 2019-07-09 ENCOUNTER — Other Ambulatory Visit: Payer: Self-pay

## 2019-07-09 DIAGNOSIS — R200 Anesthesia of skin: Secondary | ICD-10-CM | POA: Insufficient documentation

## 2019-07-09 DIAGNOSIS — F17210 Nicotine dependence, cigarettes, uncomplicated: Secondary | ICD-10-CM | POA: Insufficient documentation

## 2019-07-09 DIAGNOSIS — M25531 Pain in right wrist: Secondary | ICD-10-CM | POA: Insufficient documentation

## 2019-07-09 DIAGNOSIS — R202 Paresthesia of skin: Secondary | ICD-10-CM | POA: Insufficient documentation

## 2019-07-09 DIAGNOSIS — Z79899 Other long term (current) drug therapy: Secondary | ICD-10-CM | POA: Insufficient documentation

## 2019-07-09 DIAGNOSIS — R9389 Abnormal findings on diagnostic imaging of other specified body structures: Secondary | ICD-10-CM | POA: Insufficient documentation

## 2019-07-09 NOTE — Discharge Instructions (Addendum)
Your x-ray showed a possible small avulsion fragment off the scaphoid bone, a bone in the wrist.  Due to the possible fracture we will treat you by placing you in a wrist splint until you follow-up with the hand surgeon in the next 1 to 2 weeks.  They will likely get repeat x-rays.  You can take 1 to 2 tablets of Tylenol (350mg -1000mg  depending on the dose) every 6 hours as needed for pain.  Do not exceed 4000 mg of Tylenol daily.  If your pain persists you can take a doses of ibuprofen in between doses of Tylenol.  I usually recommend 400 to 600 mg of ibuprofen every 6 hours.  Take this with food to avoid upset stomach issues.  Wear the splint at all times, cover with a bag and tape shut when you are showering.  Follow-up with a hand surgeon or your PCP in the next 1 to 2 weeks for reevaluation and repeat x-rays.  Return to the emergency department immediately for any concerning signs or symptoms develop such as fever, severe swelling, loss of pulses or feeling in the hand.

## 2019-07-09 NOTE — ED Triage Notes (Signed)
Pt c/o atraumatic rt wrist pain x 2 days.  Swelling noted.

## 2019-07-09 NOTE — ED Provider Notes (Signed)
[facility]-EMERGENCY DEPT Provider Note   CSN: [id number] Arrival date & time: 07/09/19  2002     History   Chief Complaint Chief Complaint  Patient presents with  . Wrist Pain    HPI Paula Thompson is a 48 y.o. female with history of anemia presents for evaluation of acute onset, progressively worsening right wrist pain for 2 days.  She reports that she awoke in the middle of the night 2 days ago to go to the bathroom and noticed some pain to her wrist when wiping.  Since then she has had progressively worsening pain primarily along the radial aspect of the right wrist which radiates into the digits.  Worsens with certain movements and palpation.  She notes intermittent numbness and tingling to all of her digits.  Denies any known trauma but thinks she may have rolled over and slept on her wrist.  She is right-hand dominant.  She has tried Aleve and Aspercreme with some relief.  Denies fever.  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      The history is provided by the patient.    Past Medical History:  Diagnosis Date  . Anemia   . Blood transfusion without reported diagnosis   . Gastric ulcer with hemorrhage     Patient Active Problem List   Diagnosis Date Noted  . Antral ulcer 10/01/2014  . Tooth pain   . Upper GI bleed 09/25/2014  . Acute blood loss anemia 09/25/2014    Past Surgical History:  Procedure Laterality Date  . ESOPHAGOGASTRODUODENOSCOPY N/A 09/26/2014   Procedure: ESOPHAGOGASTRODUODENOSCOPY (EGD);  Surgeon: Theda Belfast, MD;  Location: [facility] ENDOSCOPY;  Service: Endoscopy;  Laterality: N/A;     OB History   No obstetric history on file.      Home Medications    Prior to Admission medications   Medication Sig Start Date End Date Taking? Authorizing Provider  meloxicam (MOBIC) 7.5 MG tablet Take 1 tablet (7.5 mg total) by mouth 2 (two) times daily after a meal. 12/26/16   Kindl, Quita Skye, MD  pantoprazole (PROTONIX) 40 MG tablet Take 1 tablet (40 mg  total) by mouth daily. 02/01/17   Deatra Canter, FNP  pantoprazole (PROTONIX) 40 MG tablet Take 1 tablet (40 mg total) by mouth daily. 02/01/17   Deatra Canter, FNP  bismuth-metronidazole-tetracycline (PYLERA) [phone] MG per capsule Take 3 capsules by mouth 4 (four) times daily -  before meals and at bedtime. 01/13/15 04/26/16  Meryl Dare, MD    Family History Family History  Problem Relation Age of Onset  . Diabetes Father   . Hyperlipidemia Brother   . Heart disease Brother   . Cancer Maternal Grandfather   . Lung cancer Maternal Grandfather   . Heart disease Maternal Grandmother     Social History Social History   Tobacco Use  . Smoking status: Current Every Day Smoker    Types: Cigarettes  . Smokeless tobacco: Never Used  Substance Use Topics  . Alcohol use: Yes    Alcohol/week: 0.0 standard drinks  . Drug use: No     Allergies   Amoxicillin   Review of Systems Review of Systems  Constitutional: Negative for fever.  Musculoskeletal: Positive for arthralgias.  Neurological: Positive for numbness. Negative for weakness.     Physical Exam Updated Vital Signs BP (!) 151/98   Pulse 85   Temp 98.3 F (36.8 C) (Oral)   Resp 16   Ht 5\' 5"  (1.651 m)  Wt 111.1 kg   LMP 06/08/2019   SpO2 95%   BMI 40.77 kg/m   Physical Exam Vitals signs and nursing note reviewed.  Constitutional:      General: She is not in acute distress.    Appearance: She is well-developed.  HENT:     Head: Normocephalic and atraumatic.  Eyes:     General:        Right eye: No discharge.        Left eye: No discharge.     Conjunctiva/sclera: Conjunctivae normal.  Neck:     Vascular: No JVD.     Trachea: No tracheal deviation.  Cardiovascular:     Pulses: Normal pulses.     Comments: 2+ radial pulses bilaterally Pulmonary:     Effort: Pulmonary effort is normal.  Abdominal:     General: There is no distension.  Musculoskeletal:     Comments: Right snuffbox  tenderness.  No crepitus or swelling.  No erythema.  Somewhat limited range of motion with flexion extension of the wrist but 5/5 strength of BUE major muscle groups with good grip strength bilaterally.  Positive Finkelstein test.  Positive thumb longitudinal compression test.  Skin:    General: Skin is warm and dry.     Findings: No erythema.  Neurological:     Mental Status: She is alert.     Comments: Fluent speech, no facial droop, sensation intact to light touch of bilateral hands.  Psychiatric:        Behavior: Behavior normal.      ED Treatments / Results  Labs (all labs ordered are listed, but only abnormal results are displayed) Labs Reviewed - No data to display  EKG None  Radiology Dg Wrist Complete Right  Result Date: 07/09/2019 CLINICAL DATA:  Wrist swelling, pain EXAM: RIGHT WRIST - COMPLETE 3+ VIEW COMPARISON:  None FINDINGS: Small bone fragment is noted adjacent to the distal scaphoid could reflect a small avulsed fragment, age indeterminate. Joint spaces maintained. IMPRESSION: Possible small avulsed fragment off the distal scaphoid, age indeterminate. Electronically Signed   By: Rolm Baptise M.D.   On: 07/09/2019 20:45    Procedures Procedures (including critical care time)  Medications Ordered in ED Medications - No data to display   Initial Impression / Assessment and Plan / ED Course  I have reviewed the triage vital signs and the nursing notes.  Pertinent labs & imaging results that were available during my care of the patient were reviewed by me and considered in my medical decision making (see chart for details).        Patient with complaint of acute onset right wrist pain, no known history of trauma.  She is afebrile, initially hypertensive and tachycardic prior to my assessment, with improvement on re-evaluation.  She is nontoxic in appearance, neurovascularly intact in no apparent distress.  Compartments are soft.  No concern for septic  arthritis, gout, osteomyelitis.  Radiographs show a possible small avulsed fragment off the distal scaphoid of indeterminate age.  Given she is focally tender around this region we will treat this as a presumed scaphoid fracture until she can follow-up with hand surgery for reevaluation.  Spica splint was applied.  Conservative therapy indicated and discussed with patient including splint care, NSAIDs, Tylenol, ice.  Recommend follow-up with hand surgery in the next 1 to 2 weeks.  Discussed strict ED return precautions. Patient verbalized understanding of and agreement with plan and is safe for discharge home at this time.  Final Clinical Impressions(s) / ED Diagnoses   Final diagnoses:  Abnormal x-ray  Acute pain of right wrist    ED Discharge Orders    None       Bennye AlmFawze, Dnyla Antonetti A, PA-C 07/10/19 0127    Raeford RazorKohut, Stephen, MD 07/12/19 1352

## 2019-12-02 ENCOUNTER — Other Ambulatory Visit: Payer: Self-pay | Admitting: Orthopedic Surgery

## 2019-12-02 DIAGNOSIS — M25511 Pain in right shoulder: Secondary | ICD-10-CM

## 2019-12-22 ENCOUNTER — Other Ambulatory Visit: Payer: Self-pay

## 2020-06-02 DIAGNOSIS — M1711 Unilateral primary osteoarthritis, right knee: Secondary | ICD-10-CM | POA: Insufficient documentation

## 2020-09-01 ENCOUNTER — Ambulatory Visit: Payer: Medicaid Other | Admitting: Family Medicine

## 2020-11-09 ENCOUNTER — Other Ambulatory Visit: Payer: Medicaid Other

## 2020-11-09 DIAGNOSIS — Z20822 Contact with and (suspected) exposure to covid-19: Secondary | ICD-10-CM

## 2020-11-11 LAB — SARS-COV-2, NAA 2 DAY TAT

## 2020-11-11 LAB — NOVEL CORONAVIRUS, NAA: SARS-CoV-2, NAA: DETECTED — AB

## 2021-07-17 ENCOUNTER — Encounter (HOSPITAL_COMMUNITY): Payer: Self-pay

## 2021-07-17 ENCOUNTER — Other Ambulatory Visit: Payer: Self-pay

## 2021-07-17 ENCOUNTER — Emergency Department (HOSPITAL_COMMUNITY): Payer: Self-pay

## 2021-07-17 ENCOUNTER — Inpatient Hospital Stay (HOSPITAL_COMMUNITY)
Admission: EM | Admit: 2021-07-17 | Discharge: 2021-07-19 | DRG: 291 | Disposition: A | Discharge status: Home / Self Care | Payer: Self-pay | Attending: Internal Medicine | Admitting: Internal Medicine

## 2021-07-17 DIAGNOSIS — R7303 Prediabetes: Secondary | ICD-10-CM | POA: Diagnosis present

## 2021-07-17 DIAGNOSIS — I152 Hypertension secondary to endocrine disorders: Secondary | ICD-10-CM

## 2021-07-17 DIAGNOSIS — R778 Other specified abnormalities of plasma proteins: Secondary | ICD-10-CM

## 2021-07-17 DIAGNOSIS — R7989 Other specified abnormal findings of blood chemistry: Secondary | ICD-10-CM

## 2021-07-17 DIAGNOSIS — R718 Other abnormality of red blood cells: Secondary | ICD-10-CM | POA: Diagnosis present

## 2021-07-17 DIAGNOSIS — R0600 Dyspnea, unspecified: Secondary | ICD-10-CM

## 2021-07-17 DIAGNOSIS — I160 Hypertensive urgency: Secondary | ICD-10-CM | POA: Diagnosis present

## 2021-07-17 DIAGNOSIS — I248 Other forms of acute ischemic heart disease: Secondary | ICD-10-CM | POA: Diagnosis present

## 2021-07-17 DIAGNOSIS — I5021 Acute systolic (congestive) heart failure: Secondary | ICD-10-CM | POA: Diagnosis present

## 2021-07-17 DIAGNOSIS — I428 Other cardiomyopathies: Secondary | ICD-10-CM | POA: Diagnosis present

## 2021-07-17 DIAGNOSIS — I110 Hypertensive heart disease with heart failure: Principal | ICD-10-CM | POA: Diagnosis present

## 2021-07-17 DIAGNOSIS — F17210 Nicotine dependence, cigarettes, uncomplicated: Secondary | ICD-10-CM | POA: Diagnosis present

## 2021-07-17 DIAGNOSIS — Z880 Allergy status to penicillin: Secondary | ICD-10-CM

## 2021-07-17 DIAGNOSIS — Z8249 Family history of ischemic heart disease and other diseases of the circulatory system: Secondary | ICD-10-CM

## 2021-07-17 DIAGNOSIS — I509 Heart failure, unspecified: Secondary | ICD-10-CM

## 2021-07-17 DIAGNOSIS — Z20822 Contact with and (suspected) exposure to covid-19: Secondary | ICD-10-CM | POA: Diagnosis present

## 2021-07-17 DIAGNOSIS — E049 Nontoxic goiter, unspecified: Secondary | ICD-10-CM | POA: Diagnosis present

## 2021-07-17 DIAGNOSIS — Z6841 Body Mass Index (BMI) 40.0 and over, adult: Secondary | ICD-10-CM

## 2021-07-17 DIAGNOSIS — I10 Essential (primary) hypertension: Secondary | ICD-10-CM

## 2021-07-17 DIAGNOSIS — Z8711 Personal history of peptic ulcer disease: Secondary | ICD-10-CM

## 2021-07-17 DIAGNOSIS — I255 Ischemic cardiomyopathy: Secondary | ICD-10-CM | POA: Diagnosis present

## 2021-07-17 DIAGNOSIS — Z833 Family history of diabetes mellitus: Secondary | ICD-10-CM

## 2021-07-17 DIAGNOSIS — I313 Pericardial effusion (noninflammatory): Secondary | ICD-10-CM | POA: Diagnosis present

## 2021-07-17 LAB — BASIC METABOLIC PANEL
Anion gap: 12 (ref 5–15)
BUN: 13 mg/dL (ref 6–20)
CO2: 25 mmol/L (ref 22–32)
Calcium: 9.1 mg/dL (ref 8.9–10.3)
Chloride: 109 mmol/L (ref 98–111)
Creatinine, Ser: 0.6 mg/dL (ref 0.44–1.00)
GFR, Estimated: >60 mL/min (ref >60)
Glucose, Bld: 122 mg/dL — ABNORMAL HIGH (ref 70–99)
Potassium: 4 mmol/L (ref 3.5–5.1)
Sodium: 146 mmol/L — ABNORMAL HIGH (ref 135–145)

## 2021-07-17 LAB — BRAIN NATRIURETIC PEPTIDE: B Natriuretic Peptide: 278.9 pg/mL — ABNORMAL HIGH (ref 0.0–100.0)

## 2021-07-17 LAB — LIPASE, BLOOD: Lipase: 29 U/L (ref 11–51)

## 2021-07-17 LAB — CBC WITH DIFFERENTIAL/PLATELET
Abs Immature Granulocytes: 0.03 10*3/uL (ref 0.00–0.07)
Basophils Absolute: 0 10*3/uL (ref 0.0–0.1)
Basophils Relative: 0 %
Eosinophils Absolute: 0.3 10*3/uL (ref 0.0–0.5)
Eosinophils Relative: 3 %
HCT: 43.1 % (ref 36.0–46.0)
Hemoglobin: 12.8 g/dL (ref 12.0–15.0)
Immature Granulocytes: 0 %
Lymphocytes Relative: 27 %
Lymphs Abs: 2.3 10*3/uL (ref 0.7–4.0)
MCH: 22.8 pg — ABNORMAL LOW (ref 26.0–34.0)
MCHC: 29.7 g/dL — ABNORMAL LOW (ref 30.0–36.0)
MCV: 76.7 fL — ABNORMAL LOW (ref 80.0–100.0)
Monocytes Absolute: 0.8 10*3/uL (ref 0.1–1.0)
Monocytes Relative: 10 %
Neutro Abs: 5 10*3/uL (ref 1.7–7.7)
Neutrophils Relative %: 60 %
Platelets: 191 10*3/uL (ref 150–400)
RBC: 5.62 MIL/uL — ABNORMAL HIGH (ref 3.87–5.11)
RDW: 21.3 % — ABNORMAL HIGH (ref 11.5–15.5)
WBC: 8.5 10*3/uL (ref 4.0–10.5)
nRBC: 0 % (ref 0.0–0.2)

## 2021-07-17 LAB — CBC
HCT: 43.1 % (ref 36.0–46.0)
Hemoglobin: 13.1 g/dL (ref 12.0–15.0)
MCH: 23.1 pg — ABNORMAL LOW (ref 26.0–34.0)
MCHC: 30.4 g/dL (ref 30.0–36.0)
MCV: 75.9 fL — ABNORMAL LOW (ref 80.0–100.0)
Platelets: 189 10*3/uL (ref 150–400)
RBC: 5.68 MIL/uL — ABNORMAL HIGH (ref 3.87–5.11)
RDW: 21.2 % — ABNORMAL HIGH (ref 11.5–15.5)
WBC: 9.9 10*3/uL (ref 4.0–10.5)
nRBC: 0 % (ref 0.0–0.2)

## 2021-07-17 LAB — CREATININE, SERUM
Creatinine, Ser: 0.52 mg/dL (ref 0.44–1.00)
GFR, Estimated: >60 mL/min (ref >60)

## 2021-07-17 LAB — HEPATIC FUNCTION PANEL
ALT: 33 U/L (ref 0–44)
AST: 27 U/L (ref 15–41)
Albumin: 3.8 g/dL (ref 3.5–5.0)
Alkaline Phosphatase: 64 U/L (ref 38–126)
Bilirubin, Direct: 0.1 mg/dL (ref 0.0–0.2)
Indirect Bilirubin: 0.8 mg/dL (ref 0.3–0.9)
Total Bilirubin: 0.9 mg/dL (ref 0.3–1.2)
Total Protein: 7.5 g/dL (ref 6.5–8.1)

## 2021-07-17 LAB — TROPONIN I (HIGH SENSITIVITY)
Troponin I (High Sensitivity): 39 ng/L — ABNORMAL HIGH (ref <18)
Troponin I (High Sensitivity): 35 ng/L — ABNORMAL HIGH (ref <18)

## 2021-07-17 LAB — RESP PANEL BY RT-PCR (FLU A&B, COVID) ARPGX2
Influenza A by PCR: NEGATIVE
Influenza B by PCR: NEGATIVE
SARS Coronavirus 2 by RT PCR: NEGATIVE

## 2021-07-17 LAB — I-STAT BETA HCG BLOOD, ED (MC, WL, AP ONLY): I-stat hCG, quantitative: <5 m[IU]/mL (ref <5)

## 2021-07-17 LAB — HEMOGLOBIN A1C
Hgb A1c MFr Bld: 6.2 % — ABNORMAL HIGH (ref 4.8–5.6)
Mean Plasma Glucose: 131.24 mg/dL

## 2021-07-17 LAB — CBG MONITORING, ED: Glucose-Capillary: 130 mg/dL — ABNORMAL HIGH (ref 70–99)

## 2021-07-17 MED ORDER — AEROCHAMBER Z-STAT PLUS/MEDIUM MISC
1.0000 | Freq: Once | Status: AC
  Administered 2021-07-17: 1
  Filled 2021-07-17: qty 1

## 2021-07-17 MED ORDER — ALBUTEROL SULFATE HFA 108 (90 BASE) MCG/ACT IN AERS
2.0000 | INHALATION_SPRAY | Freq: Once | RESPIRATORY_TRACT | Status: AC
  Administered 2021-07-17: 2 via RESPIRATORY_TRACT
  Filled 2021-07-17: qty 6.7

## 2021-07-17 MED ORDER — IOHEXOL 350 MG/ML SOLN
100.0000 mL | Freq: Once | INTRAVENOUS | Status: AC | PRN
  Administered 2021-07-17: 100 mL via INTRAVENOUS

## 2021-07-17 MED ORDER — ACETAMINOPHEN 325 MG PO TABS
650.0000 mg | ORAL_TABLET | Freq: Four times a day (QID) | ORAL | Status: DC | PRN
  Administered 2021-07-18 (×2): 650 mg via ORAL
  Filled 2021-07-17 (×2): qty 2

## 2021-07-17 MED ORDER — ACETAMINOPHEN 650 MG RE SUPP: 650.0000 mg | Freq: Four times a day (QID) | RECTAL | Status: DC | PRN

## 2021-07-17 MED ORDER — ENOXAPARIN SODIUM 40 MG/0.4ML IJ SOSY
40.0000 mg | PREFILLED_SYRINGE | INTRAMUSCULAR | Status: DC
  Administered 2021-07-18 – 2021-07-19 (×2): 40 mg via SUBCUTANEOUS
  Filled 2021-07-17 (×2): qty 0.4

## 2021-07-17 MED ORDER — FUROSEMIDE 10 MG/ML IJ SOLN
40.0000 mg | Freq: Every day | INTRAMUSCULAR | Status: DC
  Administered 2021-07-17 – 2021-07-18 (×2): 40 mg via INTRAVENOUS
  Filled 2021-07-17 (×2): qty 4

## 2021-07-17 MED ORDER — FUROSEMIDE 10 MG/ML IJ SOLN: 40.0000 mg | Freq: Once | INTRAMUSCULAR | Status: DC

## 2021-07-17 MED ORDER — LOSARTAN POTASSIUM 50 MG PO TABS: 50.0000 mg | ORAL_TABLET | Freq: Every day | ORAL | Status: DC

## 2021-07-17 NOTE — ED Triage Notes (Signed)
Pt states she has been short of breath for about one week. Pt states she has also had a cough for a week.

## 2021-07-17 NOTE — H&P (Signed)
History and Physical    Paula Thompson QRF:[id number] DOB: October 28, 1971 DOA: 07/17/2021  PCP: Patient, No Pcp Per (Inactive)  Patient coming from: Home  Chief Complaint: dyspnea  HPI: Paula Thompson is a 50 y.o. female with medical history significant of tobacco abuse. Presenting w/ DOE. Her symptoms started about a week ago. She noticed dyspnea wile walking. It seemed that she would get winded when normal walking distances. There was no chest pain, palpitations, or lightheadedness/dizziness. She didn't try any medicines to help. Her symptoms became more pronounced this morning. She found it difficult to get dressed, but she still went to work. At work today, she had to take frequent breaks and felt diaphoretic. She spoke with her family and decided to come to the ED.    ED Course: Troponin was elevated by flat. BNP was elevated. CXR showed vascular congestion and cardiomegaly. CTA chest showed small pericardial effusion.   Review of Systems:  Denies CP, palpitations, N/V/D, fever, sick contacts. Review of systems is otherwise negative for all not mentioned in HPI.   PMHx Past Medical History:  Diagnosis Date   Anemia    Blood transfusion without reported diagnosis    Gastric ulcer with hemorrhage     PSHx Past Surgical History:  Procedure Laterality Date   ESOPHAGOGASTRODUODENOSCOPY N/A 09/26/2014   Procedure: ESOPHAGOGASTRODUODENOSCOPY (EGD);  Surgeon: Theda Belfast, MD;  Location: [facility] ENDOSCOPY;  Service: Endoscopy;  Laterality: N/A;    SocHx  reports that she has been smoking cigarettes. She has never used smokeless tobacco. She reports current alcohol use. She reports that she does not use drugs.  Allergies  Allergen Reactions   Amoxicillin Hives    FamHx Family History  Problem Relation Age of Onset   Diabetes Father    Hyperlipidemia Brother    Heart disease Brother    Cancer Maternal Grandfather    Lung cancer Maternal Grandfather    Heart disease Maternal  Grandmother     Prior to Admission medications   Medication Sig Start Date End Date Taking? Authorizing Provider  DM-Doxylamine-Acetaminophen (NYQUIL HBP COLD & FLU) 15-6.25-325 MG/15ML LIQD Take 15 mLs by mouth every 4 (four) hours as needed (cough).   Yes [provider]  naproxen sodium (ALEVE) 220 MG tablet Take 220 mg by mouth daily as needed (pain).   Yes [provider]  bismuth-metronidazole-tetracycline (PYLERA) 140-125-125 MG per capsule Take 3 capsules by mouth 4 (four) times daily -  before meals and at bedtime. 01/13/15 04/26/16  Meryl Dare, MD    Physical Exam: Vitals:   07/17/21 1100 07/17/21 1300 07/17/21 1515 07/17/21 1520  BP: (!) 161/110   (!) 160/118  Pulse: 93 97 95 99  Resp: 13 20 18 20   Temp:      TempSrc:      SpO2: 91% 98% 96% 97%  Weight:      Height:        General: 50 y.o. female resting in bed in NAD Eyes: PERRL, normal sclera ENMT: Nares patent w/o discharge, orophaynx clear, dentition normal, ears w/o discharge/lesions/ulcers Neck: Supple, trachea midline Cardiovascular: RRR, +S1, S2, no m/g/r, equal pulses throughout Respiratory: decreased at bases, no w/r/r, normal WOB GI: BS+, NDNT, no masses noted, no organomegaly noted MSK: No c/c; BLE edema Skin: No rashes, bruises, ulcerations noted Neuro: A&O x 3, no focal deficits Psyc: Appropriate interaction and affect, calm/cooperative  Labs on Admission: I have personally reviewed following labs and imaging studies  CBC: Recent Labs  Lab 07/17/21 1004  WBC 8.5  NEUTROABS 5.0  HGB 12.8  HCT 43.1  MCV 76.7*  PLT 191   Basic Metabolic Panel: Recent Labs  Lab 07/17/21 1004  NA 146*  K 4.0  CL 109  CO2 25  GLUCOSE 122*  BUN 13  CREATININE 0.60  CALCIUM 9.1   GFR: Estimated Creatinine Clearance: 101 mL/min (by C-G formula based on SCr of 0.6 mg/dL). Liver Function Tests: Recent Labs  Lab 07/17/21 1004  AST 27  ALT 33  ALKPHOS 64  BILITOT 0.9  PROT 7.5   ALBUMIN 3.8   Recent Labs  Lab 07/17/21 1004  LIPASE 29   No results for input(s): AMMONIA in the last 168 hours. Coagulation Profile: No results for input(s): INR, PROTIME in the last 168 hours. Cardiac Enzymes: No results for input(s): CKTOTAL, CKMB, CKMBINDEX, TROPONINI in the last 168 hours. BNP (last 3 results) No results for input(s): PROBNP in the last 8760 hours. HbA1C: No results for input(s): HGBA1C in the last 72 hours. CBG: Recent Labs  Lab 07/17/21 0851  GLUCAP 130*   Lipid Profile: No results for input(s): CHOL, HDL, LDLCALC, TRIG, CHOLHDL, LDLDIRECT in the last 72 hours. Thyroid Function Tests: No results for input(s): TSH, T4TOTAL, FREET4, T3FREE, THYROIDAB in the last 72 hours. Anemia Panel: No results for input(s): VITAMINB12, FOLATE, FERRITIN, TIBC, IRON, RETICCTPCT in the last 72 hours. Urine analysis:    Component Value Date/Time   COLORURINE YELLOW 09/25/2014 0225   APPEARANCEUR CLEAR 09/25/2014 0225   LABSPEC <1.005 (L) 09/25/2014 0225   PHURINE 5.0 09/25/2014 0225   GLUCOSEU NEGATIVE 09/25/2014 0225   HGBUR NEGATIVE 09/25/2014 0225   BILIRUBINUR NEGATIVE 09/25/2014 0225   KETONESUR NEGATIVE 09/25/2014 0225   PROTEINUR NEGATIVE 09/25/2014 0225   UROBILINOGEN 0.2 09/25/2014 0225   NITRITE NEGATIVE 09/25/2014 0225   LEUKOCYTESUR NEGATIVE 09/25/2014 0225    Radiological Exams on Admission: DG Chest 2 View  Result Date: 07/17/2021 CLINICAL DATA:  Shortness of breath for 1 week with cough EXAM: CHEST - 2 VIEW COMPARISON:  12/18/2006 FINDINGS: Cardiomegaly. Mild vascular congestion. No Kerley lines, effusion, or pneumothorax. IMPRESSION: Cardiomegaly and vascular congestion. Electronically Signed   By: Tiburcio Pea M.D.   On: 07/17/2021 11:32   CT Angio Chest/Abd/Pel for Dissection W and/or W/WO  Result Date: 07/17/2021 CLINICAL DATA:  Acute shortness of breath, tachypnea and tachycardia for 1 week. Associated cough. History of pericardial  effusion. EXAM: CT ANGIOGRAPHY CHEST, ABDOMEN AND PELVIS TECHNIQUE: Non-contrast CT of the chest was initially obtained. Multidetector CT imaging through the chest, abdomen and pelvis was performed using the standard protocol during bolus administration of intravenous contrast. Multiplanar reconstructed images and MIPs were obtained and reviewed to evaluate the vascular anatomy. CONTRAST:  OMNIPAQUE IOHEXOL 350 MG/ML SOLN COMPARISON:  None. FINDINGS: CTA CHEST FINDINGS Cardiovascular: Initial noncontrast imaging demonstrates atherosclerotic changes of the aorta. No hyperdense intramural hematoma appreciated by noncontrast imaging. Heart is enlarged. Trace pericardial effusion predominantly anterior. Intact aorta. Normal caliber. Negative for aneurysm or dissection. No mediastinal hemorrhage or hematoma. Limited assessment of the pulmonary arteries. No large central pulmonary embolus appreciated. Mild cardiomegaly.  Native coronary atherosclerosis noted. Central venous structures are patent.  No veno-occlusive process. Mediastinum/Nodes: Heterogeneous enlarged thyroid, larger on the left with substernal extension favored to be goiter. Diffuse thyroid pseudo nodularity. No bulky adenopathy. Slight rightward tracheal deviation related to the enlarged left thyroid lobe. Trachea and central airways are patent. Esophagus nondilated. No hiatal hernia. Lungs/Pleura: Lingula and  minor bibasilar atelectasis. No acute airspace process, significant collapse or consolidation. Negative for edema or interstitial disease. No pleural abnormality, effusion or pneumothorax. Musculoskeletal: No acute osseous finding or chest wall soft tissue abnormality. Review of the MIP images confirms the above findings. CTA ABDOMEN AND PELVIS FINDINGS VASCULAR Aorta: Limited CTA because of contrast opacification and body habitus. Abdominal aorta intact. No aneurysm or dissection. No occlusive process. Minor aortoiliac bifurcation  atherosclerosis. Celiac: Widely patent origin including its branches SMA: Widely patent origin including its branches Renals: Main renal arteries appear patent. No accessory renal artery appreciated. IMA: Limited assessment but appears to remain patent off the distal aorta Inflow: Iliac atherosclerosis and tortuosity without occlusive process or inflow disease. Veins: No acute abdominopelvic veno-occlusive process. Review of the MIP images confirms the above findings. NON-VASCULAR Hepatobiliary: Mild hepatomegaly. No focal hepatic abnormality or biliary obstruction pattern. Hepatic and portal veins are patent. Small gallstones noted layering dependently. Gallbladder nondistended. Common bile duct nondilated. Pancreas: Unremarkable. No pancreatic ductal dilatation or surrounding inflammatory changes. Spleen: Normal in size without focal abnormality. Adrenals/Urinary Tract: Adrenal glands are unremarkable. Kidneys are normal, without renal calculi, focal lesion, or hydronephrosis. Bladder is unremarkable. Stomach/Bowel: Negative for bowel obstruction, significant dilatation, ileus, or free air. Scattered colonic diverticulosis. No acute inflammatory process. Normal appendix demonstrated containing air. No free fluid, fluid collection, hemorrhage, hematoma, abscess or ascites. Lymphatic: No bulky adenopathy. Reproductive: Uterus and bilateral adnexa are unremarkable. Other: No abdominal wall hernia or abnormality. No abdominopelvic ascites. Musculoskeletal: Degenerative changes of the lumbosacral spine and lumbar facet joints. Review of the MIP images confirms the above findings. IMPRESSION: Limited CTA but the thoracoabdominal aorta appears intact without acute dissection, aneurysm, or other acute vascular process. No acute central PE. Cardiomegaly with small pericardial effusion anteriorly. Enlarged thyroid extending substernal with pseudo nodularity compatible with thyroid goiter. Incidental cholelithiasis Colonic  diverticulosis without acute inflammatory process Aortic Atherosclerosis (ICD10-I70.0). Electronically Signed   By: Judie Petit.  Shick M.D.   On: 07/17/2021 14:57    EKG: Independently reviewed. Sinus tach, no st elevation  Assessment/Plan CHF, unknown type Dyspnea Pericardial effusion     - admit to inpt, tele     - lasix 40mg  IV daily, cozaar 50mg  daily     - echo     - trp up slightly, but flat, trend another set     - EKG ok     - cards consulted (Dr. ); appreciate assistance     - I&O, daily wts     - fluid restriction to 1500cc/day  HTN     - started on lasix, cozaar for HF; follow BPs  Hyperglycemia     - check A1c  Tobacco abuse     - counseled against smoking  DVT prophylaxis: lovenox  Code Status: FULL  Family Communication: None at bedside  Consults called: Cardiology  Status is: Inpatient  Remains inpatient appropriate because:Inpatient level of care appropriate due to severity of illness  Dispo: The patient is from: Home              Anticipated d/c is to: Home              Patient currently is not medically stable to d/c.   Difficult to place patient No  Time spent coordinating admission: 60 minutes  Phu Record A Tyrelle Raczka DO [facility]  If 7PM-7AM, please contact night-coverage [email or web address]  07/17/2021, 4:46 PM

## 2021-07-17 NOTE — ED Provider Notes (Signed)
[facility]-EMERGENCY DEPT Provider Note   CSN: [id number] Arrival date & time: 07/17/21  0843     History Chief Complaint  Patient presents with   Shortness of Breath    Paula Thompson is a 50 y.o. female who presents with concern for shortness of breath for 1 week, worsening dyspnea on exertion, feeling that she cannot carry on a conversation when she is moving around and also experiencing sensation that her heart is racing when she is moving.  No history of the same.  Patient with history of gastric ulcer with hemorrhage that required blood transfusion.  She is on PPI daily for this.  No history of CV procedures.  HPI     Past Medical History:  Diagnosis Date   Anemia    Blood transfusion without reported diagnosis    Gastric ulcer with hemorrhage     Patient Active Problem List   Diagnosis Date Noted   CHF (congestive heart failure) (HCC) 07/17/2021   Antral ulcer 10/01/2014   Tooth pain    Upper GI bleed 09/25/2014   Acute blood loss anemia 09/25/2014    Past Surgical History:  Procedure Laterality Date   ESOPHAGOGASTRODUODENOSCOPY N/A 09/26/2014   Procedure: ESOPHAGOGASTRODUODENOSCOPY (EGD);  Surgeon: Theda Belfast, MD;  Location: [facility] ENDOSCOPY;  Service: Endoscopy;  Laterality: N/A;     OB History   No obstetric history on file.     Family History  Problem Relation Age of Onset   Diabetes Father    Hyperlipidemia Brother    Heart disease Brother    Cancer Maternal Grandfather    Lung cancer Maternal Grandfather    Heart disease Maternal Grandmother     Social History   Tobacco Use   Smoking status: Every Day    Types: Cigarettes   Smokeless tobacco: Never  Substance Use Topics   Alcohol use: Yes    Alcohol/week: 0.0 standard drinks   Drug use: No    Home Medications Prior to Admission medications   Medication Sig Start Date End Date Taking? Authorizing Provider  DM-Doxylamine-Acetaminophen (NYQUIL HBP COLD & FLU)  15-6.25-325 MG/15ML LIQD Take 15 mLs by mouth every 4 (four) hours as needed (cough).   Yes [provider]  naproxen sodium (ALEVE) 220 MG tablet Take 220 mg by mouth daily as needed (pain).   Yes [provider]  bismuth-metronidazole-tetracycline (PYLERA) 140-125-125 MG per capsule Take 3 capsules by mouth 4 (four) times daily -  before meals and at bedtime. 01/13/15 04/26/16  Meryl Dare, MD    Allergies    Amoxicillin  Review of Systems   Review of Systems  Constitutional:  Positive for activity change. Negative for appetite change, chills, fatigue and fever.  HENT: Negative.    Eyes: Negative.   Respiratory:  Positive for chest tightness and shortness of breath.   Cardiovascular:  Positive for palpitations. Negative for chest pain and leg swelling.  Gastrointestinal: Negative.   Genitourinary: Negative.   Musculoskeletal: Negative.   Neurological: Negative.   Hematological: Negative.    Physical Exam Updated Vital Signs BP (!) 160/118   Pulse 99   Temp 98.7 F (37.1 C) (Oral)   Resp 20   Ht 5\' 5"  (1.651 m)   Wt 108.9 kg   SpO2 97%   BMI 39.94 kg/m   Physical Exam Vitals and nursing note reviewed.  Constitutional:      Appearance: She is obese. She is not toxic-appearing.  HENT:  Head: Normocephalic and atraumatic.     Nose: Nose normal.     Mouth/Throat:     Mouth: Mucous membranes are moist.     Pharynx: Oropharynx is clear. Uvula midline. No oropharyngeal exudate or posterior oropharyngeal erythema.     Tonsils: No tonsillar exudate.  Eyes:     General: Lids are normal. Vision grossly intact.        Right eye: No discharge.        Left eye: No discharge.     Extraocular Movements: Extraocular movements intact.     Conjunctiva/sclera: Conjunctivae normal.     Pupils: Pupils are equal, round, and reactive to light.  Neck:     Trachea: Trachea and phonation normal.     Meningeal: Brudzinski's sign and Kernig's sign absent.   Cardiovascular:     Rate and Rhythm: Normal rate and regular rhythm.     Pulses: Normal pulses.     Heart sounds: Normal heart sounds. No murmur heard. Pulmonary:     Effort: Pulmonary effort is normal. Tachypnea and prolonged expiration present. No bradypnea, accessory muscle usage or respiratory distress.     Breath sounds: Examination of the left-middle field reveals wheezing. Examination of the right-lower field reveals wheezing and rales. Examination of the left-lower field reveals wheezing and rales. Wheezing and rales present. No rhonchi.     Comments: Dyspneic even with conversation. Chest:     Chest wall: No mass, lacerations, deformity, swelling, tenderness, crepitus or edema.  Abdominal:     General: Bowel sounds are normal. There is no distension.     Palpations: Abdomen is soft.     Tenderness: There is no abdominal tenderness. There is no right CVA tenderness, left CVA tenderness, guarding or rebound.  Musculoskeletal:        General: No deformity.     Cervical back: Normal range of motion and neck supple. No edema, rigidity or crepitus. No pain with movement, spinous process tenderness or muscular tenderness.     Right lower leg: No tenderness. 1+ Edema present.     Left lower leg: No tenderness. 1+ Edema present.  Lymphadenopathy:     Cervical: No cervical adenopathy.  Skin:    General: Skin is warm and dry.     Capillary Refill: Capillary refill takes less than 2 seconds.  Neurological:     General: No focal deficit present.     Mental Status: She is alert and oriented to person, place, and time. Mental status is at baseline.     Gait: Gait is intact.  Psychiatric:        Mood and Affect: Mood normal.    ED Results / Procedures / Treatments   Labs (all labs ordered are listed, but only abnormal results are displayed) Labs Reviewed  CBC WITH DIFFERENTIAL/PLATELET - Abnormal; Notable for the following components:      Result Value   RBC 5.62 (*)    MCV 76.7 (*)     MCH 22.8 (*)    MCHC 29.7 (*)    RDW 21.3 (*)    All other components within normal limits  BASIC METABOLIC PANEL - Abnormal; Notable for the following components:   Sodium 146 (*)    Glucose, Bld 122 (*)    All other components within normal limits  BRAIN NATRIURETIC PEPTIDE - Abnormal; Notable for the following components:   B Natriuretic Peptide 278.9 (*)    All other components within normal limits  CBG MONITORING, ED - Abnormal; Notable  for the following components:   Glucose-Capillary 130 (*)    All other components within normal limits  TROPONIN I (HIGH SENSITIVITY) - Abnormal; Notable for the following components:   Troponin I (High Sensitivity) 39 (*)    All other components within normal limits  TROPONIN I (HIGH SENSITIVITY) - Abnormal; Notable for the following components:   Troponin I (High Sensitivity) 35 (*)    All other components within normal limits  RESP PANEL BY RT-PCR (FLU A&B, COVID) [id number]  HEPATIC FUNCTION PANEL  LIPASE, BLOOD  I-STAT BETA HCG BLOOD, ED ([facility], [facility], [facility] ONLY)    EKG EKG Interpretation  Date/Time:  Monday July 17 2021 08:52:03 EDT Ventricular Rate:  108 PR Interval:  150 QRS Duration: 90 QT Interval:  361 QTC Calculation: 484 R Axis:   62 Text Interpretation: Sinus tachycardia Anterior infarct, old Nonspecific T abnormalities, lateral leads Since last tracing rate faster Otherwise no significant change Confirmed by Mancel Bale [id number]) on 07/17/2021 11:32:02 AM  Radiology DG Chest 2 View  Result Date: 07/17/2021 CLINICAL DATA:  Shortness of breath for 1 week with cough EXAM: CHEST - 2 VIEW COMPARISON:  12/18/2006 FINDINGS: Cardiomegaly. Mild vascular congestion. No Kerley lines, effusion, or pneumothorax. IMPRESSION: Cardiomegaly and vascular congestion. Electronically Signed   By: Tiburcio Pea M.D.   On: 07/17/2021 11:32   CT Angio Chest/Abd/Pel for Dissection W and/or W/WO  Result Date: 07/17/2021 CLINICAL DATA:  Acute  shortness of breath, tachypnea and tachycardia for 1 week. Associated cough. History of pericardial effusion. EXAM: CT ANGIOGRAPHY CHEST, ABDOMEN AND PELVIS TECHNIQUE: Non-contrast CT of the chest was initially obtained. Multidetector CT imaging through the chest, abdomen and pelvis was performed using the standard protocol during bolus administration of intravenous contrast. Multiplanar reconstructed images and MIPs were obtained and reviewed to evaluate the vascular anatomy. CONTRAST:  OMNIPAQUE IOHEXOL 350 MG/ML SOLN COMPARISON:  None. FINDINGS: CTA CHEST FINDINGS Cardiovascular: Initial noncontrast imaging demonstrates atherosclerotic changes of the aorta. No hyperdense intramural hematoma appreciated by noncontrast imaging. Heart is enlarged. Trace pericardial effusion predominantly anterior. Intact aorta. Normal caliber. Negative for aneurysm or dissection. No mediastinal hemorrhage or hematoma. Limited assessment of the pulmonary arteries. No large central pulmonary embolus appreciated. Mild cardiomegaly.  Native coronary atherosclerosis noted. Central venous structures are patent.  No veno-occlusive process. Mediastinum/Nodes: Heterogeneous enlarged thyroid, larger on the left with substernal extension favored to be goiter. Diffuse thyroid pseudo nodularity. No bulky adenopathy. Slight rightward tracheal deviation related to the enlarged left thyroid lobe. Trachea and central airways are patent. Esophagus nondilated. No hiatal hernia. Lungs/Pleura: Lingula and minor bibasilar atelectasis. No acute airspace process, significant collapse or consolidation. Negative for edema or interstitial disease. No pleural abnormality, effusion or pneumothorax. Musculoskeletal: No acute osseous finding or chest wall soft tissue abnormality. Review of the MIP images confirms the above findings. CTA ABDOMEN AND PELVIS FINDINGS VASCULAR Aorta: Limited CTA because of contrast opacification and body habitus. Abdominal  aorta intact. No aneurysm or dissection. No occlusive process. Minor aortoiliac bifurcation atherosclerosis. Celiac: Widely patent origin including its branches SMA: Widely patent origin including its branches Renals: Main renal arteries appear patent. No accessory renal artery appreciated. IMA: Limited assessment but appears to remain patent off the distal aorta Inflow: Iliac atherosclerosis and tortuosity without occlusive process or inflow disease. Veins: No acute abdominopelvic veno-occlusive process. Review of the MIP images confirms the above findings. NON-VASCULAR Hepatobiliary: Mild hepatomegaly. No focal hepatic abnormality or biliary obstruction pattern. Hepatic and portal veins are patent. Small  gallstones noted layering dependently. Gallbladder nondistended. Common bile duct nondilated. Pancreas: Unremarkable. No pancreatic ductal dilatation or surrounding inflammatory changes. Spleen: Normal in size without focal abnormality. Adrenals/Urinary Tract: Adrenal glands are unremarkable. Kidneys are normal, without renal calculi, focal lesion, or hydronephrosis. Bladder is unremarkable. Stomach/Bowel: Negative for bowel obstruction, significant dilatation, ileus, or free air. Scattered colonic diverticulosis. No acute inflammatory process. Normal appendix demonstrated containing air. No free fluid, fluid collection, hemorrhage, hematoma, abscess or ascites. Lymphatic: No bulky adenopathy. Reproductive: Uterus and bilateral adnexa are unremarkable. Other: No abdominal wall hernia or abnormality. No abdominopelvic ascites. Musculoskeletal: Degenerative changes of the lumbosacral spine and lumbar facet joints. Review of the MIP images confirms the above findings. IMPRESSION: Limited CTA but the thoracoabdominal aorta appears intact without acute dissection, aneurysm, or other acute vascular process. No acute central PE. Cardiomegaly with small pericardial effusion anteriorly. Enlarged thyroid extending  substernal with pseudo nodularity compatible with thyroid goiter. Incidental cholelithiasis Colonic diverticulosis without acute inflammatory process Aortic Atherosclerosis (ICD10-I70.0). Electronically Signed   By: Judie Petit.  Shick M.D.   On: 07/17/2021 14:57    Procedures Procedures   Medications Ordered in ED Medications  albuterol (VENTOLIN HFA) 108 (90 Base) MCG/ACT inhaler 2 puff (2 puffs Inhalation Given 07/17/21 1021)  aerochamber Z-Stat Plus/medium 1 each (1 each Other Given 07/17/21 1022)  iohexol (OMNIPAQUE) 350 MG/ML injection 100 mL (100 mLs Intravenous Contrast Given 07/17/21 1403)    ED Course  I have reviewed the triage vital signs and the nursing notes.  Pertinent labs & imaging results that were available during my care of the patient were reviewed by me and considered in my medical decision making (see chart for details).  Clinical Course as of 07/17/21 1613  Mon Jul 17, 2021  1004 15 pack year hx of smoking cigarettes. [RS]  1555 Consult to Dr. Ronaldo Miyamoto, hospitalist, who is agreeable to seeing this patient admitting her to his service.  Appreciate his collaboration care of this patient. [RS]    Clinical Course User Index [RS] Vahan Wadsworth, Eugene Gavia, PA-C   MDM Rules/Calculators/A&P                         50 year old female presents with concern for dyspnea on exertion for the last week with associated shortness of breath even at rest.  Differential diagnose includes and limited to PE, pleural effusion, pneumonia, pneumothorax, ACS, dysrhythmia, pericardial effusion, pericarditis, myocarditis, CHF, pulmonary hypertension.  Tachycardic and tachypneic on intake, hypertensive.  To my exam patient remains mildly tachycardic and tachypneic.  Rales in the lung bases bilaterally.  Abdominal exam is benign.  Very mild lower extremity edema, 1+ without pitting.  CBC unremarkable, BMP unremarkable, BNP elevated to 279, troponin elevated to 39 then 35, flat, suspect demand ischemia.  Lipase  is normal, 25.  Patient is not pregnant.  Chest x-ray with vascular congestion, EKG reassuring with sinus tachycardia without ischemic changes.  CT PE study was obtained given new presentation which revealed no PE but cardiomegaly and small pericardial effusion anteriorly.  Feel patient would benefit from admission to the hospital for further evaluation.  Consult to hospitalist as above.  No further work-up warranted in ED this time.  Waylon voiced understanding of her medical evaluation and treatment plan.  Each of her questions was answered to her expressed satisfaction.  She is amenable plan for admission at this time.  This chart was dictated using voice recognition software, Dragon. Despite the best efforts of this provider to  proofread and correct errors, errors may still occur which can change documentation meaning.  Final Clinical Impression(s) / ED Diagnoses Final diagnoses:  Dyspnea, unspecified type    Rx / DC Orders ED Discharge Orders     None        Sherrilee Gilles 07/17/21 1616    Mancel Bale, MD 07/17/21 1708

## 2021-07-17 NOTE — ED Notes (Signed)
ED TO INPATIENT HANDOFF REPORT  ED Nurse Name and Phone #: [phone] Charise Carwin, RN   S Name/Age/Gender Paula Thompson 50 y.o. female Room/Bed: WA02/WA02  Code Status   Code Status: Prior  Home/SNF/Other Home Patient oriented to: self, place, time, and situation Is this baseline? Yes   Triage Complete: Triage complete  Chief Complaint CHF (congestive heart failure) (HCC) [I50.9]  Triage Note Pt states she has been short of breath for about one week. Pt states she has also had a cough for a week.   Allergies Allergies  Allergen Reactions   Amoxicillin Hives    Level of Care/Admitting Diagnosis ED Disposition     ED Disposition  Admit   Condition  --   Comment  Hospital Area: [facility] [[id number]]  Level of Care: Telemetry [5]  Admit to tele based on following criteria: Acute CHF  May admit patient to [facility] or [facility] if equivalent level of care is available:: No  Covid Evaluation: Symptomatic Person Under Investigation (PUI)  Diagnosis: CHF (congestive heart failure) [facility]) [[id number]]  Admitting Physician: Teddy Spike [[id number]]  Attending Physician: Teddy Spike [[id number]]  Estimated length of stay: past midnight tomorrow  Certification:: I certify this patient will need inpatient services for at least 2 midnights          B Medical/Surgery History Past Medical History:  Diagnosis Date   Anemia    Blood transfusion without reported diagnosis    Gastric ulcer with hemorrhage    Past Surgical History:  Procedure Laterality Date   ESOPHAGOGASTRODUODENOSCOPY N/A 09/26/2014   Procedure: ESOPHAGOGASTRODUODENOSCOPY (EGD);  Surgeon: Theda Belfast, MD;  Location: [facility] ENDOSCOPY;  Service: Endoscopy;  Laterality: N/A;     A IV Location/Drains/Wounds Patient Lines/Drains/Airways Status     Active Line/Drains/Airways     Name Placement date Placement time Site Days   Peripheral IV 07/17/21 20 G Left Antecubital 07/17/21   0900  Antecubital  less than 1            Intake/Output Last 24 hours No intake or output data in the 24 hours ending 07/17/21 2104  Labs/Imaging Results for orders placed or performed during the hospital encounter of 07/17/21 (from the past 48 hour(s))  CBG monitoring, ED     Status: Abnormal   Collection Time: 07/17/21  8:51 AM  Result Value Ref Range   Glucose-Capillary 130 (H) 70 - 99 mg/dL    Comment: Glucose reference range applies only to samples taken after fasting for at least 8 hours.  CBC with Differential     Status: Abnormal   Collection Time: 07/17/21 10:04 AM  Result Value Ref Range   WBC 8.5 4.0 - 10.5 K/uL   RBC 5.62 (H) 3.87 - 5.11 MIL/uL   Hemoglobin 12.8 12.0 - 15.0 g/dL   HCT 59.5 63.8 - 75.6 %   MCV 76.7 (L) 80.0 - 100.0 fL   MCH 22.8 (L) 26.0 - 34.0 pg   MCHC 29.7 (L) 30.0 - 36.0 g/dL   RDW 43.3 (H) 29.5 - 18.8 %   Platelets 191 150 - 400 K/uL   nRBC 0.0 0.0 - 0.2 %   Neutrophils Relative % 60 %   Neutro Abs 5.0 1.7 - 7.7 K/uL   Lymphocytes Relative 27 %   Lymphs Abs 2.3 0.7 - 4.0 K/uL   Monocytes Relative 10 %   Monocytes Absolute 0.8 0.1 - 1.0 K/uL   Eosinophils Relative 3 %   Eosinophils  Absolute 0.3 0.0 - 0.5 K/uL   Basophils Relative 0 %   Basophils Absolute 0.0 0.0 - 0.1 K/uL   Immature Granulocytes 0 %   Abs Immature Granulocytes 0.03 0.00 - 0.07 K/uL   Polychromasia PRESENT    Ovalocytes PRESENT     Comment: Performed at [facility], [address], [place], [place] [address]  Basic metabolic panel     Status: Abnormal   Collection Time: 07/17/21 10:04 AM  Result Value Ref Range   Sodium 146 (H) 135 - 145 mmol/L   Potassium 4.0 3.5 - 5.1 mmol/L   Chloride 109 98 - 111 mmol/L   CO2 25 22 - 32 mmol/L   Glucose, Bld 122 (H) 70 - 99 mg/dL    Comment: Glucose reference range applies only to samples taken after fasting for at least 8 hours.   BUN 13 6 - 20 mg/dL   Creatinine, Ser 1.32 0.44 - 1.00 mg/dL   Calcium 9.1  8.9 - 44.0 mg/dL   GFR, Estimated >10 >27 mL/min    Comment: (NOTE) Calculated using the CKD-EPI Creatinine Equation (2021)    Anion gap 12 5 - 15    Comment: Performed at [facility], [address] [place], [place] [address]  Troponin I (High Sensitivity)     Status: Abnormal   Collection Time: 07/17/21 10:04 AM  Result Value Ref Range   Troponin I (High Sensitivity) 39 (H) <18 ng/L    Comment: (NOTE) Elevated high sensitivity troponin I (hsTnI) values and significant  changes across serial measurements may suggest ACS but many other  chronic and acute conditions are known to elevate hsTnI results.  Refer to the "Links" section for chest pain algorithms and additional  guidance. Performed at [facility], [address] [place], [place] [address]   Brain natriuretic peptide     Status: Abnormal   Collection Time: 07/17/21 10:04 AM  Result Value Ref Range   B Natriuretic Peptide 278.9 (H) 0.0 - 100.0 pg/mL    Comment: Performed at [facility], [address], [place], [place] [address]  Hepatic function panel     Status: None   Collection Time: 07/17/21 10:04 AM  Result Value Ref Range   Total Protein 7.5 6.5 - 8.1 g/dL   Albumin 3.8 3.5 - 5.0 g/dL   AST 27 15 - 41 U/L   ALT 33 0 - 44 U/L   Alkaline Phosphatase 64 38 - 126 U/L   Total Bilirubin 0.9 0.3 - 1.2 mg/dL   Bilirubin, Direct 0.1 0.0 - 0.2 mg/dL   Indirect Bilirubin 0.8 0.3 - 0.9 mg/dL    Comment: Performed at [facility], [address] [place], [place] [address]  Lipase, blood     Status: None   Collection Time: 07/17/21 10:04 AM  Result Value Ref Range   Lipase 29 11 - 51 U/L    Comment: Performed at [facility], [address] [place], [place] [address]  I-Stat Beta hCG blood, ED ([facility], [facility], [facility] only)     Status: None   Collection Time: 07/17/21 10:30 AM  Result Value Ref Range   I-stat hCG, quantitative <5.0 <5  mIU/mL   Comment 3            Comment:   GEST. AGE      CONC.  (mIU/mL)   <=1 WEEK        5 - 50  2 WEEKS       50 - 500     3 WEEKS       100 - 10,000     4 WEEKS     1,000 - 30,000        FEMALE AND NON-PREGNANT FEMALE:     LESS THAN 5 mIU/mL   Troponin I (High Sensitivity)     Status: Abnormal   Collection Time: 07/17/21 12:04 PM  Result Value Ref Range   Troponin I (High Sensitivity) 35 (H) <18 ng/L    Comment: (NOTE) Elevated high sensitivity troponin I (hsTnI) values and significant  changes across serial measurements may suggest ACS but many other  chronic and acute conditions are known to elevate hsTnI results.  Refer to the "Links" section for chest pain algorithms and additional  guidance. Performed at [facility], [address], [place], [place] [address]   Resp Panel by RT-PCR (Flu A&B, Covid) Nasopharyngeal Swab     Status: None   Collection Time: 07/17/21  4:26 PM   Specimen: Nasopharyngeal Swab; Nasopharyngeal(NP) swabs in vial transport medium  Result Value Ref Range   SARS Coronavirus 2 by RT PCR NEGATIVE NEGATIVE    Comment: (NOTE) SARS-CoV-2 target nucleic acids are NOT DETECTED.  The SARS-CoV-2 RNA is generally detectable in upper respiratory specimens during the acute phase of infection. The lowest concentration of SARS-CoV-2 viral copies this assay can detect is 138 copies/mL. A negative result does not preclude SARS-Cov-2 infection and should not be used as the sole basis for treatment or other patient management decisions. A negative result may occur with  improper specimen collection/handling, submission of specimen other than nasopharyngeal swab, presence of viral mutation(s) within the areas targeted by this assay, and inadequate number of viral copies(<138 copies/mL). A negative result must be combined with clinical observations, patient history, and epidemiological information. The expected result is Negative.  Fact Sheet  for Patients:  BloggerCourse.com  Fact Sheet for Healthcare Providers:  SeriousBroker.it  This test is no t yet approved or cleared by the [place] FDA and  has been authorized for detection and/or diagnosis of SARS-CoV-2 by FDA under an Emergency Use Authorization (EUA). This EUA will remain  in effect (meaning this test can be used) for the duration of the COVID-19 declaration under Section 564(b)(1) of the Act, 21 U.S.C.section 360bbb-3(b)(1), unless the authorization is terminated  or revoked sooner.       Influenza A by PCR NEGATIVE NEGATIVE   Influenza B by PCR NEGATIVE NEGATIVE    Comment: (NOTE) The Xpert Xpress SARS-CoV-2/FLU/RSV plus assay is intended as an aid in the diagnosis of influenza from Nasopharyngeal swab specimens and should not be used as a sole basis for treatment. Nasal washings and aspirates are unacceptable for Xpert Xpress SARS-CoV-2/FLU/RSV testing.  Fact Sheet for Patients: BloggerCourse.com  Fact Sheet for Healthcare Providers: SeriousBroker.it  This test is not yet approved or cleared by the [place] FDA and has been authorized for detection and/or diagnosis of SARS-CoV-2 by FDA under an Emergency Use Authorization (EUA). This EUA will remain in effect (meaning this test can be used) for the duration of the COVID-19 declaration under Section 564(b)(1) of the Act, 21 U.S.C. section 360bbb-3(b)(1), unless the authorization is terminated or revoked.  Performed at [facility], [address] [place], [place] [address]   CBC     Status: Abnormal   Collection Time: 07/17/21  6:31 PM  Result Value Ref Range  WBC 9.9 4.0 - 10.5 K/uL    Comment: WHITE COUNT CONFIRMED ON SMEAR   RBC 5.68 (H) 3.87 - 5.11 MIL/uL   Hemoglobin 13.1 12.0 - 15.0 g/dL   HCT 38.1 82.9 - 93.7 %   MCV 75.9 (L) 80.0 - 100.0 fL   MCH 23.1 (L)  26.0 - 34.0 pg   MCHC 30.4 30.0 - 36.0 g/dL   RDW 16.9 (H) 67.8 - 93.8 %   Platelets 189 150 - 400 K/uL    Comment: PLATELET COUNT CONFIRMED BY SMEAR   nRBC 0.0 0.0 - 0.2 %    Comment: Performed at [facility], [address] [place], [place] [address]  Creatinine, serum     Status: None   Collection Time: 07/17/21  6:31 PM  Result Value Ref Range   Creatinine, Ser 0.52 0.44 - 1.00 mg/dL   GFR, Estimated >10 >25 mL/min    Comment: (NOTE) Calculated using the CKD-EPI Creatinine Equation (2021) Performed at [facility], [address], [place], [place] [address]    DG Chest 2 View  Result Date: 07/17/2021 CLINICAL DATA:  Shortness of breath for 1 week with cough EXAM: CHEST - 2 VIEW COMPARISON:  12/18/2006 FINDINGS: Cardiomegaly. Mild vascular congestion. No Kerley lines, effusion, or pneumothorax. IMPRESSION: Cardiomegaly and vascular congestion. Electronically Signed   By: Tiburcio Pea M.D.   On: 07/17/2021 11:32   CT Angio Chest/Abd/Pel for Dissection W and/or W/WO  Result Date: 07/17/2021 CLINICAL DATA:  Acute shortness of breath, tachypnea and tachycardia for 1 week. Associated cough. History of pericardial effusion. EXAM: CT ANGIOGRAPHY CHEST, ABDOMEN AND PELVIS TECHNIQUE: Non-contrast CT of the chest was initially obtained. Multidetector CT imaging through the chest, abdomen and pelvis was performed using the standard protocol during bolus administration of intravenous contrast. Multiplanar reconstructed images and MIPs were obtained and reviewed to evaluate the vascular anatomy. CONTRAST:  OMNIPAQUE IOHEXOL 350 MG/ML SOLN COMPARISON:  None. FINDINGS: CTA CHEST FINDINGS Cardiovascular: Initial noncontrast imaging demonstrates atherosclerotic changes of the aorta. No hyperdense intramural hematoma appreciated by noncontrast imaging. Heart is enlarged. Trace pericardial effusion predominantly anterior. Intact aorta. Normal caliber. Negative  for aneurysm or dissection. No mediastinal hemorrhage or hematoma. Limited assessment of the pulmonary arteries. No large central pulmonary embolus appreciated. Mild cardiomegaly.  Native coronary atherosclerosis noted. Central venous structures are patent.  No veno-occlusive process. Mediastinum/Nodes: Heterogeneous enlarged thyroid, larger on the left with substernal extension favored to be goiter. Diffuse thyroid pseudo nodularity. No bulky adenopathy. Slight rightward tracheal deviation related to the enlarged left thyroid lobe. Trachea and central airways are patent. Esophagus nondilated. No hiatal hernia. Lungs/Pleura: Lingula and minor bibasilar atelectasis. No acute airspace process, significant collapse or consolidation. Negative for edema or interstitial disease. No pleural abnormality, effusion or pneumothorax. Musculoskeletal: No acute osseous finding or chest wall soft tissue abnormality. Review of the MIP images confirms the above findings. CTA ABDOMEN AND PELVIS FINDINGS VASCULAR Aorta: Limited CTA because of contrast opacification and body habitus. Abdominal aorta intact. No aneurysm or dissection. No occlusive process. Minor aortoiliac bifurcation atherosclerosis. Celiac: Widely patent origin including its branches SMA: Widely patent origin including its branches Renals: Main renal arteries appear patent. No accessory renal artery appreciated. IMA: Limited assessment but appears to remain patent off the distal aorta Inflow: Iliac atherosclerosis and tortuosity without occlusive process or inflow disease. Veins: No acute abdominopelvic veno-occlusive process. Review of the MIP images confirms the above findings. NON-VASCULAR Hepatobiliary: Mild hepatomegaly. No focal hepatic abnormality  or biliary obstruction pattern. Hepatic and portal veins are patent. Small gallstones noted layering dependently. Gallbladder nondistended. Common bile duct nondilated. Pancreas: Unremarkable. No pancreatic ductal  dilatation or surrounding inflammatory changes. Spleen: Normal in size without focal abnormality. Adrenals/Urinary Tract: Adrenal glands are unremarkable. Kidneys are normal, without renal calculi, focal lesion, or hydronephrosis. Bladder is unremarkable. Stomach/Bowel: Negative for bowel obstruction, significant dilatation, ileus, or free air. Scattered colonic diverticulosis. No acute inflammatory process. Normal appendix demonstrated containing air. No free fluid, fluid collection, hemorrhage, hematoma, abscess or ascites. Lymphatic: No bulky adenopathy. Reproductive: Uterus and bilateral adnexa are unremarkable. Other: No abdominal wall hernia or abnormality. No abdominopelvic ascites. Musculoskeletal: Degenerative changes of the lumbosacral spine and lumbar facet joints. Review of the MIP images confirms the above findings. IMPRESSION: Limited CTA but the thoracoabdominal aorta appears intact without acute dissection, aneurysm, or other acute vascular process. No acute central PE. Cardiomegaly with small pericardial effusion anteriorly. Enlarged thyroid extending substernal with pseudo nodularity compatible with thyroid goiter. Incidental cholelithiasis Colonic diverticulosis without acute inflammatory process Aortic Atherosclerosis (ICD10-I70.0). Electronically Signed   By: Judie Petit.  Shick M.D.   On: 07/17/2021 14:57    Pending Labs Unresulted Labs (From admission, onward)     Start     Ordered   07/24/21 0500  Creatinine, serum  (enoxaparin (LOVENOX)    CrCl >/= 30 ml/min)  Weekly,   R     Comments: while on enoxaparin therapy    07/17/21 1815   07/18/21 0500  CBC  Tomorrow morning,   R        07/17/21 1815   07/17/21 1815  HIV Antibody (routine testing w rflx)  (HIV Antibody (Routine testing w reflex) panel)  Once,   STAT        07/17/21 1815   07/17/21 1815  Hemoglobin A1c  Once,   STAT        07/17/21 1815   Signed and Held  Comprehensive metabolic panel  Tomorrow morning,   R        Signed and  Held            Vitals/Pain Today's Vitals   07/17/21 1300 07/17/21 1515 07/17/21 1520 07/17/21 1900  BP:   (!) 160/118 (!) 161/110  Pulse: 97 95 99 98  Resp: 20 18 20 17   Temp:      TempSrc:      SpO2: 98% 96% 97% 95%  Weight:      Height:      PainSc:        Isolation Precautions Airborne and Contact precautions  Medications Medications  furosemide (LASIX) injection 40 mg (40 mg Intravenous Given 07/17/21 1825)  albuterol (VENTOLIN HFA) 108 (90 Base) MCG/ACT inhaler 2 puff (2 puffs Inhalation Given 07/17/21 1021)  aerochamber Z-Stat Plus/medium 1 each (1 each Other Given 07/17/21 1022)  iohexol (OMNIPAQUE) 350 MG/ML injection 100 mL (100 mLs Intravenous Contrast Given 07/17/21 1403)    Mobility walks Low fall risk   Focused Assessments    R Recommendations: See Admitting Provider Note  Report given to:   Additional Notes:

## 2021-07-18 ENCOUNTER — Inpatient Hospital Stay (HOSPITAL_COMMUNITY): Payer: Self-pay

## 2021-07-18 DIAGNOSIS — R778 Other specified abnormalities of plasma proteins: Secondary | ICD-10-CM

## 2021-07-18 DIAGNOSIS — I5021 Acute systolic (congestive) heart failure: Secondary | ICD-10-CM

## 2021-07-18 DIAGNOSIS — I10 Essential (primary) hypertension: Secondary | ICD-10-CM

## 2021-07-18 DIAGNOSIS — I5022 Chronic systolic (congestive) heart failure: Secondary | ICD-10-CM

## 2021-07-18 DIAGNOSIS — I152 Hypertension secondary to endocrine disorders: Secondary | ICD-10-CM

## 2021-07-18 LAB — ECHOCARDIOGRAM COMPLETE
AR max vel: 3.52 cm2
AV Area VTI: 3.27 cm2
AV Area mean vel: 3.5 cm2
AV Mean grad: 3 mmHg
AV Peak grad: 6.2 mmHg
Ao pk vel: 1.24 m/s
Height: 65 in
S' Lateral: 6.6 cm
Single Plane A4C EF: 24.8 %
Weight: 4059.99 oz

## 2021-07-18 LAB — COMPREHENSIVE METABOLIC PANEL
ALT: 30 U/L (ref 0–44)
AST: 24 U/L (ref 15–41)
Albumin: 3.9 g/dL (ref 3.5–5.0)
Alkaline Phosphatase: 60 U/L (ref 38–126)
Anion gap: 11 (ref 5–15)
BUN: 13 mg/dL (ref 6–20)
CO2: 27 mmol/L (ref 22–32)
Calcium: 9.6 mg/dL (ref 8.9–10.3)
Chloride: 103 mmol/L (ref 98–111)
Creatinine, Ser: 0.52 mg/dL (ref 0.44–1.00)
GFR, Estimated: >60 mL/min (ref >60)
Glucose, Bld: 119 mg/dL — ABNORMAL HIGH (ref 70–99)
Potassium: 3.7 mmol/L (ref 3.5–5.1)
Sodium: 141 mmol/L (ref 135–145)
Total Bilirubin: 1.2 mg/dL (ref 0.3–1.2)
Total Protein: 7.7 g/dL (ref 6.5–8.1)

## 2021-07-18 LAB — CBC
HCT: 42.6 % (ref 36.0–46.0)
Hemoglobin: 13.2 g/dL (ref 12.0–15.0)
MCH: 23 pg — ABNORMAL LOW (ref 26.0–34.0)
MCHC: 31 g/dL (ref 30.0–36.0)
MCV: 74.3 fL — ABNORMAL LOW (ref 80.0–100.0)
Platelets: 186 10*3/uL (ref 150–400)
RBC: 5.73 MIL/uL — ABNORMAL HIGH (ref 3.87–5.11)
RDW: 21.2 % — ABNORMAL HIGH (ref 11.5–15.5)
WBC: 9.3 10*3/uL (ref 4.0–10.5)
nRBC: 0 % (ref 0.0–0.2)

## 2021-07-18 LAB — HIV ANTIBODY (ROUTINE TESTING W REFLEX): HIV Screen 4th Generation wRfx: NONREACTIVE

## 2021-07-18 LAB — TSH: TSH: 1.999 u[IU]/mL (ref 0.350–4.500)

## 2021-07-18 MED ORDER — IVABRADINE HCL 5 MG PO TABS
5.0000 mg | ORAL_TABLET | Freq: Two times a day (BID) | ORAL | Status: DC
  Administered 2021-07-18 – 2021-07-19 (×2): 5 mg via ORAL
  Filled 2021-07-18 (×3): qty 1

## 2021-07-18 MED ORDER — SACUBITRIL-VALSARTAN 24-26 MG PO TABS
1.0000 | ORAL_TABLET | Freq: Two times a day (BID) | ORAL | Status: DC
  Filled 2021-07-18: qty 1

## 2021-07-18 MED ORDER — SPIRONOLACTONE 25 MG PO TABS
25.0000 mg | ORAL_TABLET | Freq: Every day | ORAL | Status: DC
  Administered 2021-07-18 – 2021-07-19 (×2): 25 mg via ORAL
  Filled 2021-07-18 (×2): qty 1

## 2021-07-18 MED ORDER — METOPROLOL SUCCINATE ER 25 MG PO TB24
25.0000 mg | ORAL_TABLET | Freq: Every day | ORAL | Status: DC
  Administered 2021-07-18 – 2021-07-19 (×2): 25 mg via ORAL
  Filled 2021-07-18 (×2): qty 1

## 2021-07-18 NOTE — Discharge Instructions (Addendum)
Website: [email or web address] on meds.  Additional discharge instructions  Please get your medications reviewed and adjusted by your Primary MD.  Please request your Primary MD to go over all Hospital Tests and Procedure/Radiological results at the follow up, please get all Hospital records sent to your Prim MD by signing hospital release before you go home.  If you had Pneumonia of Lung problems at the Hospital: Please get a 2 view Chest X ray done in 6-8 weeks after hospital discharge or sooner if instructed by your Primary MD.  If you have Congestive Heart Failure: Please call your Cardiologist or Primary MD anytime you have any of the following symptoms:  1) 3 pound weight gain in 24 hours or 5 pounds in 1 week  2) shortness of breath, with or without a dry hacking cough  3) swelling in the hands, feet or stomach  4) if you have to sleep on extra pillows at night in order to breathe  Follow cardiac low salt diet and 1.5 lit/day fluid restriction.  If you have diabetes Accuchecks 4 times/day, Once in AM empty stomach and then before each meal. Log in all results and show them to your primary doctor at your next visit. If any glucose reading is under 80 or above 300 call your primary MD immediately.  If you have Seizure/Convulsions/Epilepsy: Please do not drive, operate heavy machinery, participate in activities at heights or participate in high speed sports until you have seen by Primary MD or a Neurologist and advised to do so again.  If you had Gastrointestinal Bleeding: Please ask your Primary MD to check a complete blood count within one week of discharge or at your next visit. Your endoscopic/colonoscopic biopsies that are pending at the time of discharge, will also need to followed by your Primary MD.  Get Medicines reviewed and adjusted. Please take all your medications with you for your next visit with your Primary MD  Please request your Primary MD to go over all hospital  tests and procedure/radiological results at the follow up, please ask your Primary MD to get all Hospital records sent to his/her office.  If you experience worsening of your admission symptoms, develop shortness of breath, life threatening emergency, suicidal or homicidal thoughts you must seek medical attention immediately by calling 911 or calling your MD immediately  if symptoms less severe.  You must read complete instructions/literature along with all the possible adverse reactions/side effects for all the Medicines you take and that have been prescribed to you. Take any new Medicines after you have completely understood and accpet all the possible adverse reactions/side effects.   Do not drive or operate heavy machinery when taking Pain medications.   Do not take more than prescribed Pain, Sleep and Anxiety Medications  Special Instructions: If you have smoked or chewed Tobacco  in the last 2 yrs please stop smoking, stop any regular Alcohol  and or any Recreational drug use.  Wear Seat belts while driving.  Please note You were cared for by a hospitalist during your hospital stay. If you have any questions about your discharge medications or the care you received while you were in the hospital after you are discharged, you can call the unit and asked to speak with the hospitalist on call if the hospitalist that took care of you is not available. Once you are discharged, your primary care physician will handle any further medical issues. Please note that NO REFILLS for any discharge medications will be authorized once  you are discharged, as it is imperative that you return to your primary care physician (or establish a relationship with a primary care physician if you do not have one) for your aftercare needs so that they can reassess your need for medications and monitor your lab values.  You can reach the hospitalist office at phone [phone] or fax [phone]   If you do not have a  primary care physician, you can call [phone] for a physician referral.

## 2021-07-18 NOTE — Consult Note (Addendum)
CARDIOLOGY CONSULT NOTE  Patient ID: BELISA EICHHOLZ MRN: [id number] DOB/AGE: 12-12-1970 50 y.o.  Admit date: 07/17/2021 Referring Physician: [facility] hospitalist Reason for Consultation:  Dyspnea  HPI:   50 y.o. African-American female  with untreated hypertension, tobacco dependence, presented with dyspnea.  Patient works as a Investment banker, operational at [employer]. She has been experiencing exertional dyspnea for about a week, associated with chest tightness. This was particularly evident while dragging trash can across her dirt driveway. She does not have any symptoms at rest. Symptoms got worse yesterday prompting visit to [facility] ED. Work-up in the ED showed mildly elevated but flat troponins, elevated BNP, chest x-ray with vascular congestion and cardiomegaly.  CTA chest showed small pericardial effusion.   Past Medical History:  Diagnosis Date   Anemia    Blood transfusion without reported diagnosis    Gastric ulcer with hemorrhage      Past Surgical History:  Procedure Laterality Date   ESOPHAGOGASTRODUODENOSCOPY N/A 09/26/2014   Procedure: ESOPHAGOGASTRODUODENOSCOPY (EGD);  Surgeon: Theda Belfast, MD;  Location: [facility] ENDOSCOPY;  Service: Endoscopy;  Laterality: N/A;      Family History  Problem Relation Age of Onset   Diabetes Father    Hyperlipidemia Brother    Heart disease Brother    Cancer Maternal Grandfather    Lung cancer Maternal Grandfather    Heart disease Maternal Grandmother      Social History: Social History   Socioeconomic History   Marital status: Single    Spouse name: Not on file   Number of children: 2   Years of education: Not on file   Highest education level: Not on file  Occupational History   Occupation: MAINTENANCE  Tobacco Use   Smoking status: Every Day    Types: Cigarettes   Smokeless tobacco: Never  Substance and Sexual Activity   Alcohol use: Yes    Alcohol/week: 0.0 standard drinks   Drug use: No   Sexual activity: Not on file  Other  Topics Concern   Not on file  Social History Narrative   Not on file   Social Determinants of Health   Financial Resource Strain: Not on file  Food Insecurity: Not on file  Transportation Needs: Not on file  Physical Activity: Not on file  Stress: Not on file  Social Connections: Not on file  Intimate Partner Violence: Not on file     Medications Prior to Admission  Medication Sig Dispense Refill Last Dose   DM-Doxylamine-Acetaminophen (NYQUIL HBP COLD & FLU) 15-6.25-325 MG/15ML LIQD Take 15 mLs by mouth every 4 (four) hours as needed (cough).   07/16/2021   naproxen sodium (ALEVE) 220 MG tablet Take 220 mg by mouth daily as needed (pain).   07/14/2021    Review of Systems  Constitutional: Negative for decreased appetite, malaise/fatigue, weight gain and weight loss.  HENT:  Negative for congestion.   Eyes:  Negative for visual disturbance.  Cardiovascular:  Positive for chest pain and dyspnea on exertion. Negative for leg swelling, palpitations and syncope.  Respiratory:  Negative for cough.   Endocrine: Negative for cold intolerance.  Hematologic/Lymphatic: Does not bruise/bleed easily.  Skin:  Negative for itching and rash.  Musculoskeletal:  Negative for myalgias.  Gastrointestinal:  Negative for abdominal pain, nausea and vomiting.  Genitourinary:  Negative for dysuria.  Neurological:  Negative for dizziness and weakness.  Psychiatric/Behavioral:  The patient is not nervous/anxious.   All other systems reviewed and are negative.    Physical Exam: Physical Exam Vitals  and nursing note reviewed.  Constitutional:      General: She is not in acute distress.    Appearance: She is well-developed. She is obese.  HENT:     Head: Normocephalic and atraumatic.  Eyes:     Conjunctiva/sclera: Conjunctivae normal.     Pupils: Pupils are equal, round, and reactive to light.  Neck:     Vascular: JVD present.  Cardiovascular:     Rate and Rhythm: Regular rhythm. Tachycardia  present.     Pulses: Normal pulses and intact distal pulses.     Heart sounds: No murmur heard. Pulmonary:     Effort: Pulmonary effort is normal.     Breath sounds: Examination of the right-lower field reveals decreased breath sounds. Examination of the left-lower field reveals decreased breath sounds. Decreased breath sounds present. No wheezing or rales.  Abdominal:     General: Bowel sounds are normal.     Palpations: Abdomen is soft.     Tenderness: There is no rebound.  Musculoskeletal:        General: No tenderness. Normal range of motion.     Right lower leg: Edema (1+) present.     Left lower leg: Edema (1+) present.  Lymphadenopathy:     Cervical: No cervical adenopathy.  Skin:    General: Skin is warm and dry.  Neurological:     Mental Status: She is alert and oriented to person, place, and time.     Cranial Nerves: No cranial nerve deficit.     Labs:   Lab Results  Component Value Date   WBC 9.9 07/17/2021   HGB 13.1 07/17/2021   HCT 43.1 07/17/2021   MCV 75.9 (L) 07/17/2021   PLT 189 07/17/2021    Recent Labs  Lab 07/17/21 1004 07/17/21 1831  NA 146*  --   K 4.0  --   CL 109  --   CO2 25  --   BUN 13  --   CREATININE 0.60 0.52  CALCIUM 9.1  --   PROT 7.5  --   BILITOT 0.9  --   ALKPHOS 64  --   ALT 33  --   AST 27  --   GLUCOSE 122*  --      BNP (last 3 results) Recent Labs    07/17/21 1004  BNP 278.9*    HEMOGLOBIN A1C Lab Results  Component Value Date   HGBA1C 6.2 (H) 07/17/2021   MPG 131.24 07/17/2021    Cardiac Panel (last 3 results) Results for ADLEY, CASTELLO (MRN [id number]) as of 07/18/2021 05:28  Ref. Range 07/17/2021 10:04 07/17/2021 12:04  Troponin I (High Sensitivity) Latest Ref Range: <18 ng/L 39 (H) 35 (H)      Radiology: DG Chest 2 View  Result Date: 07/17/2021 CLINICAL DATA:  Shortness of breath for 1 week with cough EXAM: CHEST - 2 VIEW COMPARISON:  12/18/2006 FINDINGS: Cardiomegaly. Mild vascular congestion. No  Kerley lines, effusion, or pneumothorax. IMPRESSION: Cardiomegaly and vascular congestion. Electronically Signed   By: Tiburcio Pea M.D.   On: 07/17/2021 11:32   CT Angio Chest/Abd/Pel for Dissection W and/or W/WO  Result Date: 07/17/2021 CLINICAL DATA:  Acute shortness of breath, tachypnea and tachycardia for 1 week. Associated cough. History of pericardial effusion. EXAM: CT ANGIOGRAPHY CHEST, ABDOMEN AND PELVIS TECHNIQUE: Non-contrast CT of the chest was initially obtained. Multidetector CT imaging through the chest, abdomen and pelvis was performed using the standard protocol during bolus administration of intravenous contrast. Multiplanar reconstructed  images and MIPs were obtained and reviewed to evaluate the vascular anatomy. CONTRAST:  OMNIPAQUE IOHEXOL 350 MG/ML SOLN COMPARISON:  None. FINDINGS: CTA CHEST FINDINGS Cardiovascular: Initial noncontrast imaging demonstrates atherosclerotic changes of the aorta. No hyperdense intramural hematoma appreciated by noncontrast imaging. Heart is enlarged. Trace pericardial effusion predominantly anterior. Intact aorta. Normal caliber. Negative for aneurysm or dissection. No mediastinal hemorrhage or hematoma. Limited assessment of the pulmonary arteries. No large central pulmonary embolus appreciated. Mild cardiomegaly.  Native coronary atherosclerosis noted. Central venous structures are patent.  No veno-occlusive process. Mediastinum/Nodes: Heterogeneous enlarged thyroid, larger on the left with substernal extension favored to be goiter. Diffuse thyroid pseudo nodularity. No bulky adenopathy. Slight rightward tracheal deviation related to the enlarged left thyroid lobe. Trachea and central airways are patent. Esophagus nondilated. No hiatal hernia. Lungs/Pleura: Lingula and minor bibasilar atelectasis. No acute airspace process, significant collapse or consolidation. Negative for edema or interstitial disease. No pleural abnormality, effusion or  pneumothorax. Musculoskeletal: No acute osseous finding or chest wall soft tissue abnormality. Review of the MIP images confirms the above findings. CTA ABDOMEN AND PELVIS FINDINGS VASCULAR Aorta: Limited CTA because of contrast opacification and body habitus. Abdominal aorta intact. No aneurysm or dissection. No occlusive process. Minor aortoiliac bifurcation atherosclerosis. Celiac: Widely patent origin including its branches SMA: Widely patent origin including its branches Renals: Main renal arteries appear patent. No accessory renal artery appreciated. IMA: Limited assessment but appears to remain patent off the distal aorta Inflow: Iliac atherosclerosis and tortuosity without occlusive process or inflow disease. Veins: No acute abdominopelvic veno-occlusive process. Review of the MIP images confirms the above findings. NON-VASCULAR Hepatobiliary: Mild hepatomegaly. No focal hepatic abnormality or biliary obstruction pattern. Hepatic and portal veins are patent. Small gallstones noted layering dependently. Gallbladder nondistended. Common bile duct nondilated. Pancreas: Unremarkable. No pancreatic ductal dilatation or surrounding inflammatory changes. Spleen: Normal in size without focal abnormality. Adrenals/Urinary Tract: Adrenal glands are unremarkable. Kidneys are normal, without renal calculi, focal lesion, or hydronephrosis. Bladder is unremarkable. Stomach/Bowel: Negative for bowel obstruction, significant dilatation, ileus, or free air. Scattered colonic diverticulosis. No acute inflammatory process. Normal appendix demonstrated containing air. No free fluid, fluid collection, hemorrhage, hematoma, abscess or ascites. Lymphatic: No bulky adenopathy. Reproductive: Uterus and bilateral adnexa are unremarkable. Other: No abdominal wall hernia or abnormality. No abdominopelvic ascites. Musculoskeletal: Degenerative changes of the lumbosacral spine and lumbar facet joints. Review of the MIP images confirms  the above findings. IMPRESSION: Limited CTA but the thoracoabdominal aorta appears intact without acute dissection, aneurysm, or other acute vascular process. No acute central PE. Cardiomegaly with small pericardial effusion anteriorly. Enlarged thyroid extending substernal with pseudo nodularity compatible with thyroid goiter. Incidental cholelithiasis Colonic diverticulosis without acute inflammatory process Aortic Atherosclerosis (ICD10-I70.0). Electronically Signed   By: Judie Petit.  Shick M.D.   On: 07/17/2021 14:57    Scheduled Meds:  enoxaparin (LOVENOX) injection  40 mg Subcutaneous Q24H   furosemide  40 mg Intravenous Daily   losartan  50 mg Oral Daily   Continuous Infusions: PRN Meds:.acetaminophen **OR** acetaminophen  CARDIAC STUDIES:  EKG 07/17/2021: Sinus tachycardia 108 bpm Old anterior infarct Lateral nonspecific ST-T changes  Echocardiogram: Pending   Assessment & Recommendations:  50 y.o. African-American female  with untreated hypertension, tobacco dependence, presented with dyspnea.  Exertional dyspnea, chest tightness: Suspect systolic heart failure. H/o tobacco dependence, chest tightness, old anterior infarct on EKG raise possibility of ischemic cardiomyopathy. Other differentials include hypertensive or dilated cardiomyopathy. If echocardiogram confirms low EF, she will benefit from  ischemic workup. Started spironolactone 25 mg daily, metoprolol succinate 25 mg daily. Will add further GDMT for heart failure after echocardiogram-including Entresto. Continue IV lasix 40 mg bid.  Strict I/O, daily weights  Elevated troponin: Not ACS. Likely due to heart failure. Nonetheless, ischemic cardiomyopathy remains in the differential.     Elder Negus, MD Pager: [phone] Office: [phone]

## 2021-07-18 NOTE — TOC Initial Note (Signed)
Transition of Care [facility]) - Initial/Assessment Note    Patient Details  Name: Paula Thompson MRN: [id number] Date of Birth: 03-28-1971  [facility]) CM/SW Contact:    Lanier Clam, RN Phone Number: 07/18/2021, 1:59 PM  Clinical Narrative: Sherron Monday to paitent about d/c plans-works,needs pcp,has own transport home. Will provide pcp listing from [facility] clinics(patient can make own appt),goodrx website on d/c instructions-discount meds, & Health insurance info. No further CM needs.                  Expected Discharge Plan: Home/Self Care Barriers to Discharge: Continued Medical Work up   Patient Goals and CMS Choice Patient states their goals for this hospitalization and ongoing recovery are:: go home CMS Medicare.gov Compare Post Acute Care list provided to:: Patient Choice offered to / list presented to : Patient  Expected Discharge Plan and Services Expected Discharge Plan: Home/Self Care   Discharge Planning Services: CM Consult   Living arrangements for the past 2 months: Single Family Home                                      Prior Living Arrangements/Services Living arrangements for the past 2 months: Single Family Home Lives with:: Adult Children Patient language and need for interpreter reviewed:: Yes Do you feel safe going back to the place where you live?: Yes      Need for Family Participation in Patient Care: No (Comment) Care giver support system in place?: Yes (comment)   Criminal Activity/Legal Involvement Pertinent to Current Situation/Hospitalization: No - Comment as needed  Activities of Daily Living Home Assistive Devices/Equipment: Eyeglasses ADL Screening (condition at time of admission) Patient's cognitive ability adequate to safely complete daily activities?: Yes Is the patient deaf or have difficulty hearing?: No Does the patient have difficulty seeing, even when wearing glasses/contacts?: No Does the patient have difficulty  concentrating, remembering, or making decisions?: No Patient able to express need for assistance with ADLs?: Yes Does the patient have difficulty dressing or bathing?: No Independently performs ADLs?: Yes (appropriate for developmental age) Does the patient have difficulty walking or climbing stairs?: Yes (secondary to shortness of breath) Weakness of Legs: Both Weakness of Arms/Hands: None  Permission Sought/Granted Permission sought to share information with : Case Manager Permission granted to share information with : Yes, Verbal Permission Granted  Share Information with NAME: Case Manager           Emotional Assessment Appearance:: Appears stated age Attitude/Demeanor/Rapport: Gracious Affect (typically observed): Accepting Orientation: : Oriented to Self, Oriented to Place, Oriented to  Time, Oriented to Situation Alcohol / Substance Use: Not Applicable Psych Involvement: No (comment)  Admission diagnosis:  CHF (congestive heart failure) (HCC) [I50.9] Dyspnea, unspecified type [R06.00] Patient Active Problem List   Diagnosis Date Noted   Elevated troponin    Primary hypertension    CHF (congestive heart failure) (HCC) 07/17/2021   Antral ulcer 10/01/2014   Tooth pain    Upper GI bleed 09/25/2014   Acute blood loss anemia 09/25/2014   PCP:  Patient, No Pcp Per (Inactive) Pharmacy:   [id number] [place], [place] - [address] [place] [address] Phone: [phone] Fax: [phone]     Social Determinants of Health (SDOH) Interventions    Readmission Risk Interventions No flowsheet data found.

## 2021-07-18 NOTE — Plan of Care (Signed)
  Problem: Education: Goal: Ability to demonstrate management of disease process will improve Outcome: Progressing Goal: Ability to verbalize understanding of medication therapies will improve Outcome: Progressing Goal: Individualized Educational Video(s) Outcome: Progressing   Problem: Activity: Goal: Capacity to carry out activities will improve Outcome: Progressing   Problem: Cardiac: Goal: Ability to achieve and maintain adequate cardiopulmonary perfusion will improve Outcome: Progressing   Problem: Education: Goal: Knowledge of General Education information will improve Description: Including pain rating scale, medication(s)/side effects and non-pharmacologic comfort measures Outcome: Progressing   Problem: Clinical Measurements: Goal: Diagnostic test results will improve Outcome: Progressing   Problem: Activity: Goal: Risk for activity intolerance will decrease Outcome: Progressing   Problem: Safety: Goal: Ability to remain free from injury will improve Outcome: Progressing

## 2021-07-18 NOTE — Progress Notes (Signed)
PROGRESS NOTE   Paula Thompson  DZH:[id number]    DOB: 01-21-1971    DOA: 07/17/2021  PCP: Patient, No Pcp Per (Inactive)   I have briefly reviewed patients previous medical records in [facility].  Chief Complaint  Patient presents with   Shortness of Breath    Brief Narrative:  50 year old female with medical history significant for anemia, gastric ulcer with hemorrhage, hypertension, tobacco abuse, presented with 1 week history of progressively worsening dyspnea on exertion.  Admitted for hypertensive urgency and suspected new onset acute CHF.  Cardiology consulted.   Assessment & Plan:  Active Problems:   CHF (congestive heart failure) (HCC)   Elevated troponin   Primary hypertension   New onset acute systolic CHF: BNP 279.  2D echo shows LVEF <20% with LV global hypokinesis.  Need to rule out ischemic versus nonischemic causes of cardiomyopathy.  CTA chest negative for PE.  Cardiology on board and plan ischemic work-up, likely cardiac cath.  Continue IV Lasix 40 mg daily, Aldactone 25 Mg daily and Toprol-XL 25 Mg daily.  Strict intake output and daily weights.  Cardiomyopathy: Could be nonischemic/due to poorly controlled hypertension versus ischemic.  Work-up by cardiology as noted above.  Will need to initiate ARB.  Elevated troponin due to demand ischemia: Low and flat trend in the 30s.  Not consistent with ACS.  Likely related to new onset CHF.  Hypertensive urgency: Reported HTN diagnosis about 6 months ago, took antihypertensives for about 2 weeks and then ran out.  Counseled regarding compliance with low-salt diet, meds, MD follow-ups and weight loss.  BP on arrival 152/117.  Better controlled now on Toprol-XL 25 Mg daily, IV Lasix and Aldactone.  Mild circumferential pericardial effusion: No tamponade features clinically or on 2D echo.  Management per cardiology.  Tobacco abuse: Cessation counseled.  Prediabetes: A1c 6.2.  Outpatient follow-up.  Microcytosis:  Outpatient follow-up.  Goiter: Seen on CTA chest.  Extending substernally.  Clinically euthyroid.  Check TSH.  Body mass index is 42.23 kg/m./Morbid obesity: Counseled regarding importance of lifestyle modifications and weight loss.    DVT prophylaxis: enoxaparin (LOVENOX) injection 40 mg Start: 07/18/21 1000     Code Status: Full Code Family Communication: None at bedside Disposition:  Status is: Inpatient  Remains inpatient appropriate because:Ongoing diagnostic testing needed not appropriate for outpatient work up and Inpatient level of care appropriate due to severity of illness  Dispo: The patient is from: Home              Anticipated d/c is to: Home              Patient currently is not medically stable to d/c.   Difficult to place patient No        Consultants:   Cardiology  Procedures:   None  Antimicrobials:    Anti-infectives (From admission, onward)    None         Subjective:  When seen this morning, patient unable to report how her dyspnea is doing because she has not been out of bed.  Did not have any dyspnea at rest on arrival.  No chest pain.  Objective:   Vitals:   07/18/21 0500 07/18/21 0611 07/18/21 1006 07/18/21 1008  BP:  (!) 171/92 123/87 123/87  Pulse:  (!) 51 (!) 104 (!) 104  Resp:  20  20  Temp:  98.9 F (37.2 C)  98.1 F (36.7 C)  TempSrc:  Oral  Oral  SpO2:  96%  97%  Weight: 115.1 kg     Height:        General exam: Young female, moderately built and morbidly obese lying comfortably propped up in bed without distress. Respiratory system: Clear to auscultation. Respiratory effort normal. Cardiovascular system: S1 & S2 heard, RRR.  Unable to appreciate JVD due to body habitus.  No murmurs or gallops heard.  Trace bilateral ankle edema.  Telemetry personally reviewed: Sinus rhythm. Gastrointestinal system: Abdomen is nondistended, soft and nontender. No organomegaly or masses felt. Normal bowel sounds heard. Central nervous  system: Alert and oriented. No focal neurological deficits. Extremities: Symmetric 5 x 5 power. Skin: No rashes, lesions or ulcers Psychiatry: Judgement and insight appear normal. Mood & affect appropriate.     Data Reviewed:   I have personally reviewed following labs and imaging studies   CBC: Recent Labs  Lab 07/17/21 1004 07/17/21 1831 07/18/21 0501  WBC 8.5 9.9 9.3  NEUTROABS 5.0  --   --   HGB 12.8 13.1 13.2  HCT 43.1 43.1 42.6  MCV 76.7* 75.9* 74.3*  PLT 191 189 186    Basic Metabolic Panel: Recent Labs  Lab 07/17/21 1004 07/17/21 1831 07/18/21 0501  NA 146*  --  141  K 4.0  --  3.7  CL 109  --  103  CO2 25  --  27  GLUCOSE 122*  --  119*  BUN 13  --  13  CREATININE 0.60 0.52 0.52  CALCIUM 9.1  --  9.6    Liver Function Tests: Recent Labs  Lab 07/17/21 1004 07/18/21 0501  AST 27 24  ALT 33 30  ALKPHOS 64 60  BILITOT 0.9 1.2  PROT 7.5 7.7  ALBUMIN 3.8 3.9    CBG: Recent Labs  Lab 07/17/21 0851  GLUCAP 130*    Microbiology Studies:   Recent Results (from the past 240 hour(s))  Resp Panel by RT-PCR (Flu A&B, Covid) Nasopharyngeal Swab     Status: None   Collection Time: 07/17/21  4:26 PM   Specimen: Nasopharyngeal Swab; Nasopharyngeal(NP) swabs in vial transport medium  Result Value Ref Range Status   SARS Coronavirus 2 by RT PCR NEGATIVE NEGATIVE Final    Comment: (NOTE) SARS-CoV-2 target nucleic acids are NOT DETECTED.  The SARS-CoV-2 RNA is generally detectable in upper respiratory specimens during the acute phase of infection. The lowest concentration of SARS-CoV-2 viral copies this assay can detect is 138 copies/mL. A negative result does not preclude SARS-Cov-2 infection and should not be used as the sole basis for treatment or other patient management decisions. A negative result may occur with  improper specimen collection/handling, submission of specimen other than nasopharyngeal swab, presence of viral mutation(s) within  the areas targeted by this assay, and inadequate number of viral copies(<138 copies/mL). A negative result must be combined with clinical observations, patient history, and epidemiological information. The expected result is Negative.  Fact Sheet for Patients:  BloggerCourse.com  Fact Sheet for Healthcare Providers:  SeriousBroker.it  This test is no t yet approved or cleared by the [place] FDA and  has been authorized for detection and/or diagnosis of SARS-CoV-2 by FDA under an Emergency Use Authorization (EUA). This EUA will remain  in effect (meaning this test can be used) for the duration of the COVID-19 declaration under Section 564(b)(1) of the Act, 21 U.S.C.section 360bbb-3(b)(1), unless the authorization is terminated  or revoked sooner.       Influenza A by PCR NEGATIVE NEGATIVE Final  Influenza B by PCR NEGATIVE NEGATIVE Final    Comment: (NOTE) The Xpert Xpress SARS-CoV-2/FLU/RSV plus assay is intended as an aid in the diagnosis of influenza from Nasopharyngeal swab specimens and should not be used as a sole basis for treatment. Nasal washings and aspirates are unacceptable for Xpert Xpress SARS-CoV-2/FLU/RSV testing.  Fact Sheet for Patients: BloggerCourse.com  Fact Sheet for Healthcare Providers: SeriousBroker.it  This test is not yet approved or cleared by the [place] FDA and has been authorized for detection and/or diagnosis of SARS-CoV-2 by FDA under an Emergency Use Authorization (EUA). This EUA will remain in effect (meaning this test can be used) for the duration of the COVID-19 declaration under Section 564(b)(1) of the Act, 21 U.S.C. section 360bbb-3(b)(1), unless the authorization is terminated or revoked.  Performed at [facility], [address], [place], [place] [address]      Radiology Studies:  DG Chest 2  View  Result Date: 07/17/2021 CLINICAL DATA:  Shortness of breath for 1 week with cough EXAM: CHEST - 2 VIEW COMPARISON:  12/18/2006 FINDINGS: Cardiomegaly. Mild vascular congestion. No Kerley lines, effusion, or pneumothorax. IMPRESSION: Cardiomegaly and vascular congestion. Electronically Signed   By: Tiburcio Pea M.D.   On: 07/17/2021 11:32   ECHOCARDIOGRAM COMPLETE  Result Date: 07/18/2021    ECHOCARDIOGRAM REPORT   Patient Name:   Paula Thompson Date of Exam: 07/18/2021 Medical Rec #:  [id number]       Height:       65.0 in Accession #:    [id number]      Weight:       253.7 lb Date of Birth:  1971-10-18       BSA:          2.189 m Patient Age:    50 years        BP:           149/52 mmHg Patient Gender: F               HR:           105 bpm. Exam Location:  Inpatient Procedure: 2D Echo, Cardiac Doppler and Color Doppler Indications:    I50.22 Chronic systolic (congestive) heart failure  History:        Patient has no prior history of Echocardiogram examinations.                 CHF.  Sonographer:    MH Referring Phys: [id number] Teddy Spike IMPRESSIONS  1. Left ventricular ejection fraction, by estimation, is <20%. The left ventricle has severely decreased function. The left ventricle demonstrates global hypokinesis. The left ventricular internal cavity size was severely dilated. Left ventricular diastolic function could not be evaluated.  2. Right ventricular systolic function is normal. The right ventricular size is normal. Tricuspid regurgitation signal is inadequate for assessing PA pressure.  3. A small pericardial effusion is present. The pericardial effusion is circumferential. There is no evidence of cardiac tamponade.  4. The mitral valve is normal in structure. No evidence of mitral valve regurgitation. No evidence of mitral stenosis.  5. The aortic valve is normal in structure. Aortic valve regurgitation is not visualized. No aortic stenosis is present.  6. The inferior vena cava is dilated  in size with >50% respiratory variability, suggesting right atrial pressure of 8 mmHg. FINDINGS  Left Ventricle: Left ventricular ejection fraction, by estimation, is <20%. The left ventricle has severely decreased function. The left ventricle demonstrates global hypokinesis.  The left ventricular internal cavity size was severely dilated. There is no left ventricular hypertrophy. Left ventricular diastolic function could not be evaluated. Right Ventricle: The right ventricular size is normal. No increase in right ventricular wall thickness. Right ventricular systolic function is normal. Tricuspid regurgitation signal is inadequate for assessing PA pressure. Left Atrium: Left atrial size was normal in size. Right Atrium: Right atrial size was normal in size. Pericardium: A small pericardial effusion is present. The pericardial effusion is circumferential. There is no evidence of cardiac tamponade. Mitral Valve: The mitral valve is normal in structure. No evidence of mitral valve regurgitation. No evidence of mitral valve stenosis. Tricuspid Valve: The tricuspid valve is normal in structure. Tricuspid valve regurgitation is not demonstrated. No evidence of tricuspid stenosis. Aortic Valve: The aortic valve is normal in structure. Aortic valve regurgitation is not visualized. No aortic stenosis is present. Aortic valve mean gradient measures 3.0 mmHg. Aortic valve peak gradient measures 6.2 mmHg. Aortic valve area, by VTI measures 3.27 cm. Pulmonic Valve: The pulmonic valve was normal in structure. Pulmonic valve regurgitation is not visualized. No evidence of pulmonic stenosis. Aorta: The aortic root is normal in size and structure. Venous: The inferior vena cava is dilated in size with greater than 50% respiratory variability, suggesting right atrial pressure of 8 mmHg. IAS/Shunts: No atrial level shunt detected by color flow Doppler.  LEFT VENTRICLE PLAX 2D LVIDd:         7.40 cm     Diastology LVIDs:         6.60  cm     LV e' medial:  5.00 cm/s LV PW:         1.10 cm     LV e' lateral: 6.85 cm/s LV IVS:        1.00 cm LVOT diam:     2.30 cm LV SV:         63 LV SV Index:   29 LVOT Area:     4.15 cm  LV Volumes (MOD) LV vol d, MOD A4C: 99.7 ml LV vol s, MOD A4C: 75.0 ml LV SV MOD A4C:     99.7 ml RIGHT VENTRICLE             IVC RV S prime:     16.00 cm/s  IVC diam: 2.20 cm TAPSE (M-mode): 2.8 cm LEFT ATRIUM             Index       RIGHT ATRIUM          Index LA diam:        2.90 cm 1.32 cm/m  RA Area:     7.20 cm LA Vol (A2C):   39.0 ml 17.82 ml/m RA Volume:   9.57 ml  4.37 ml/m LA Vol (A4C):   65.0 ml 29.70 ml/m LA Biplane Vol: 52.7 ml 24.08 ml/m  AORTIC VALVE                   PULMONIC VALVE AV Area (Vmax):    3.52 cm    PV Vmax:       0.98 m/s AV Area (Vmean):   3.50 cm    PV Peak grad:  3.8 mmHg AV Area (VTI):     3.27 cm AV Vmax:           124.00 cm/s AV Vmean:          82.800 cm/s AV VTI:  0.192 m AV Peak Grad:      6.2 mmHg AV Mean Grad:      3.0 mmHg LVOT Vmax:         105.00 cm/s LVOT Vmean:        69.800 cm/s LVOT VTI:          0.151 m LVOT/AV VTI ratio: 0.79  AORTA Ao Root diam: 3.10 cm Ao Asc diam:  2.70 cm  SHUNTS Systemic VTI:  0.15 m Systemic Diam: 2.30 cm Armanda Magic MD Electronically signed by Armanda Magic MD Signature Date/Time: 07/18/2021/11:35:35 AM    Final    CT Angio Chest/Abd/Pel for Dissection W and/or W/WO  Result Date: 07/17/2021 CLINICAL DATA:  Acute shortness of breath, tachypnea and tachycardia for 1 week. Associated cough. History of pericardial effusion. EXAM: CT ANGIOGRAPHY CHEST, ABDOMEN AND PELVIS TECHNIQUE: Non-contrast CT of the chest was initially obtained. Multidetector CT imaging through the chest, abdomen and pelvis was performed using the standard protocol during bolus administration of intravenous contrast. Multiplanar reconstructed images and MIPs were obtained and reviewed to evaluate the vascular anatomy. CONTRAST:  OMNIPAQUE IOHEXOL 350 MG/ML SOLN  COMPARISON:  None. FINDINGS: CTA CHEST FINDINGS Cardiovascular: Initial noncontrast imaging demonstrates atherosclerotic changes of the aorta. No hyperdense intramural hematoma appreciated by noncontrast imaging. Heart is enlarged. Trace pericardial effusion predominantly anterior. Intact aorta. Normal caliber. Negative for aneurysm or dissection. No mediastinal hemorrhage or hematoma. Limited assessment of the pulmonary arteries. No large central pulmonary embolus appreciated. Mild cardiomegaly.  Native coronary atherosclerosis noted. Central venous structures are patent.  No veno-occlusive process. Mediastinum/Nodes: Heterogeneous enlarged thyroid, larger on the left with substernal extension favored to be goiter. Diffuse thyroid pseudo nodularity. No bulky adenopathy. Slight rightward tracheal deviation related to the enlarged left thyroid lobe. Trachea and central airways are patent. Esophagus nondilated. No hiatal hernia. Lungs/Pleura: Lingula and minor bibasilar atelectasis. No acute airspace process, significant collapse or consolidation. Negative for edema or interstitial disease. No pleural abnormality, effusion or pneumothorax. Musculoskeletal: No acute osseous finding or chest wall soft tissue abnormality. Review of the MIP images confirms the above findings. CTA ABDOMEN AND PELVIS FINDINGS VASCULAR Aorta: Limited CTA because of contrast opacification and body habitus. Abdominal aorta intact. No aneurysm or dissection. No occlusive process. Minor aortoiliac bifurcation atherosclerosis. Celiac: Widely patent origin including its branches SMA: Widely patent origin including its branches Renals: Main renal arteries appear patent. No accessory renal artery appreciated. IMA: Limited assessment but appears to remain patent off the distal aorta Inflow: Iliac atherosclerosis and tortuosity without occlusive process or inflow disease. Veins: No acute abdominopelvic veno-occlusive process. Review of the MIP images  confirms the above findings. NON-VASCULAR Hepatobiliary: Mild hepatomegaly. No focal hepatic abnormality or biliary obstruction pattern. Hepatic and portal veins are patent. Small gallstones noted layering dependently. Gallbladder nondistended. Common bile duct nondilated. Pancreas: Unremarkable. No pancreatic ductal dilatation or surrounding inflammatory changes. Spleen: Normal in size without focal abnormality. Adrenals/Urinary Tract: Adrenal glands are unremarkable. Kidneys are normal, without renal calculi, focal lesion, or hydronephrosis. Bladder is unremarkable. Stomach/Bowel: Negative for bowel obstruction, significant dilatation, ileus, or free air. Scattered colonic diverticulosis. No acute inflammatory process. Normal appendix demonstrated containing air. No free fluid, fluid collection, hemorrhage, hematoma, abscess or ascites. Lymphatic: No bulky adenopathy. Reproductive: Uterus and bilateral adnexa are unremarkable. Other: No abdominal wall hernia or abnormality. No abdominopelvic ascites. Musculoskeletal: Degenerative changes of the lumbosacral spine and lumbar facet joints. Review of the MIP images confirms the above findings. IMPRESSION: Limited CTA but the  thoracoabdominal aorta appears intact without acute dissection, aneurysm, or other acute vascular process. No acute central PE. Cardiomegaly with small pericardial effusion anteriorly. Enlarged thyroid extending substernal with pseudo nodularity compatible with thyroid goiter. Incidental cholelithiasis Colonic diverticulosis without acute inflammatory process Aortic Atherosclerosis (ICD10-I70.0). Electronically Signed   By: Judie Petit.  Shick M.D.   On: 07/17/2021 14:57     Scheduled Meds:    enoxaparin (LOVENOX) injection  40 mg Subcutaneous Q24H   furosemide  40 mg Intravenous Daily   metoprolol succinate  25 mg Oral Daily   spironolactone  25 mg Oral Daily    Continuous Infusions:     LOS: 1 day     Marcellus Scott, MD, FACP,  [facility]. [facility]    To contact the attending provider between 7A-7P or the covering provider during after hours 7P-7A, please log into the web site [email or web address] and access using universal [place] password for that web site. If you do not have the password, please call the hospital operator.  07/18/2021, 1:06 PM

## 2021-07-18 NOTE — Plan of Care (Signed)
  Problem: Education: Goal: Knowledge of General Education information will improve Description Including pain rating scale, medication(s)/side effects and non-pharmacologic comfort measures Outcome: Progressing   Problem: Activity: Goal: Risk for activity intolerance will decrease Outcome: Progressing   Problem: Safety: Goal: Ability to remain free from injury will improve Outcome: Progressing   

## 2021-07-19 ENCOUNTER — Telehealth: Payer: Self-pay | Admitting: Cardiology

## 2021-07-19 ENCOUNTER — Other Ambulatory Visit: Payer: Self-pay | Admitting: Cardiology

## 2021-07-19 DIAGNOSIS — Z13220 Encounter for screening for lipoid disorders: Secondary | ICD-10-CM

## 2021-07-19 DIAGNOSIS — I5020 Unspecified systolic (congestive) heart failure: Secondary | ICD-10-CM

## 2021-07-19 LAB — BASIC METABOLIC PANEL
Anion gap: 13 (ref 5–15)
BUN: 17 mg/dL (ref 6–20)
CO2: 23 mmol/L (ref 22–32)
Calcium: 9.5 mg/dL (ref 8.9–10.3)
Chloride: 103 mmol/L (ref 98–111)
Creatinine, Ser: 0.71 mg/dL (ref 0.44–1.00)
GFR, Estimated: >60 mL/min (ref >60)
Glucose, Bld: 159 mg/dL — ABNORMAL HIGH (ref 70–99)
Potassium: 4.8 mmol/L (ref 3.5–5.1)
Sodium: 139 mmol/L (ref 135–145)

## 2021-07-19 MED ORDER — SACUBITRIL-VALSARTAN 24-26 MG PO TABS: 1.0000 | ORAL_TABLET | Freq: Two times a day (BID) | ORAL | 0 refills | Status: DC

## 2021-07-19 MED ORDER — DAPAGLIFLOZIN PROPANEDIOL 10 MG PO TABS: 10.0000 mg | ORAL_TABLET | Freq: Every day | ORAL | 0 refills | Status: DC

## 2021-07-19 MED ORDER — FUROSEMIDE 40 MG PO TABS: 40.0000 mg | ORAL_TABLET | Freq: Every day | ORAL | 0 refills | Status: DC | PRN

## 2021-07-19 MED ORDER — IVABRADINE HCL 5 MG PO TABS: 5.0000 mg | ORAL_TABLET | Freq: Two times a day (BID) | ORAL | 0 refills | Status: DC

## 2021-07-19 MED ORDER — SACUBITRIL-VALSARTAN 24-26 MG PO TABS
1.0000 | ORAL_TABLET | Freq: Two times a day (BID) | ORAL | Status: DC
  Administered 2021-07-19: 1 via ORAL
  Filled 2021-07-19: qty 1

## 2021-07-19 MED ORDER — DAPAGLIFLOZIN PROPANEDIOL 10 MG PO TABS
10.0000 mg | ORAL_TABLET | Freq: Every day | ORAL | Status: DC
  Administered 2021-07-19: 10 mg via ORAL
  Filled 2021-07-19: qty 1

## 2021-07-19 MED ORDER — SPIRONOLACTONE 25 MG PO TABS: 25.0000 mg | ORAL_TABLET | Freq: Every day | ORAL | 0 refills | Status: DC

## 2021-07-19 MED ORDER — METOPROLOL SUCCINATE ER 25 MG PO TB24: 25.0000 mg | ORAL_TABLET | Freq: Every day | ORAL | 0 refills | Status: DC

## 2021-07-19 MED ORDER — FUROSEMIDE 40 MG PO TABS: 40.0000 mg | ORAL_TABLET | Freq: Every day | ORAL | Status: DC | PRN

## 2021-07-19 NOTE — Plan of Care (Signed)
  Problem: Education: Goal: Ability to demonstrate management of disease process will improve 07/19/2021 1435 by Mariann Laster, RN Outcome: Adequate for Discharge 07/19/2021 1428 by Mariann Laster, RN Outcome: Progressing Goal: Ability to verbalize understanding of medication therapies will improve Outcome: Adequate for Discharge Goal: Individualized Educational Video(s) 07/19/2021 1435 by Mariann Laster, RN Outcome: Adequate for Discharge 07/19/2021 1428 by Mariann Laster, RN Outcome: Adequate for Discharge   Problem: Activity: Goal: Capacity to carry out activities will improve 07/19/2021 1435 by Mariann Laster, RN Outcome: Adequate for Discharge 07/19/2021 1428 by Mariann Laster, RN Outcome: Adequate for Discharge   Problem: Cardiac: Goal: Ability to achieve and maintain adequate cardiopulmonary perfusion will improve 07/19/2021 1435 by Mariann Laster, RN Outcome: Adequate for Discharge 07/19/2021 1428 by Mariann Laster, RN Outcome: Adequate for Discharge   Problem: Education: Goal: Knowledge of General Education information will improve Description: Including pain rating scale, medication(s)/side effects and non-pharmacologic comfort measures Outcome: Adequate for Discharge   Problem: Clinical Measurements: Goal: Diagnostic test results will improve Outcome: Adequate for Discharge   Problem: Activity: Goal: Risk for activity intolerance will decrease 07/19/2021 1435 by Mariann Laster, RN Outcome: Adequate for Discharge 07/19/2021 1428 by Mariann Laster, RN Outcome: Progressing   Problem: Safety: Goal: Ability to remain free from injury will improve 07/19/2021 1435 by Mariann Laster, RN Outcome: Adequate for Discharge 07/19/2021 1428 by Mariann Laster, RN Outcome: Progressing

## 2021-07-19 NOTE — Plan of Care (Signed)
  Problem: Education: Goal: Ability to demonstrate management of disease process will improve Outcome: Progressing   Problem: Activity: Goal: Risk for activity intolerance will decrease Outcome: Progressing   Problem: Safety: Goal: Ability to remain free from injury will improve Outcome: Progressing

## 2021-07-19 NOTE — Discharge Summary (Signed)
Physician Discharge Summary  Paula Thompson ALP:[id number] DOB: [birth year]  PCP: Patient, No Pcp Per (Inactive)  Admitted from: Home Discharged to: Home  Admit date: 07/17/2021 Discharge date: 07/19/2021  Recommendations for Outpatient Follow-up:    Follow-up Information     Primary Care Physician. Schedule an appointment as soon as possible for a visit.   Why: Call using information provided by [facility] team and the hospital for an appointment to be seen in 1 to 2 weeks.        Elder Negus, MD Follow up on 07/25/2021.   Specialties: Cardiology, Radiology Why: Labs at [facility] on 07/24/2021 morning Heart catheterization on 07/25/2021 at [facility]. Follow up with me on 08/02/2021 at 11:00 AM  MDs office will call with details. Contact information: [address] [place] [address] [phone]                  Home Health: None    Equipment/Devices: None    Discharge Condition: Improved and stable    Code Status: Full Code Diet recommendation:  Discharge Diet Orders (From admission, onward)     Start     Ordered   07/19/21 0000  Diet - low sodium heart healthy        07/19/21 1302             Discharge Diagnoses:  Active Problems:   CHF (congestive heart failure) (HCC)   Elevated troponin   Primary hypertension   Brief Summary: 50 year old female with medical history significant for anemia, gastric ulcer with hemorrhage, hypertension, tobacco abuse, presented with 1 week history of progressively worsening dyspnea on exertion.  Admitted for hypertensive urgency and new onset acute systolic CHF.  Cardiology consulted.     Assessment & Plan:   New onset acute systolic CHF: BNP 279.  2D echo shows LVEF <20% with LV global hypokinesis.  Need to rule out ischemic versus nonischemic causes of cardiomyopathy.  CTA chest negative for PE.  Cardiology was consulted.  Briefly on IV Lasix.  Clinically euvolemic.  Multiple  medications were newly started: Lasix p.o. as needed, Aldactone, Toprol-XL, Farxiga, Corlanor and Entresto.  Cardiology has cleared patient for discharge and have arranged for close outpatient follow-up for repeat labs, cardiac cath and office follow-up.  They have also arranged for patient to get Marcelline Deist, Corlanor and Entresto samples through their offices.  Patient has been counseled extensively regarding importance of compliance with all aspects of her medical care and she verbalizes understanding.  TOC team did benefits check on all her meds and she should be able to procure the rest through her insurance.   Cardiomyopathy: Could be nonischemic/due to poorly controlled hypertension versus ischemic.  Work-up by cardiology as noted above.  Cardiac cath planned as noted above.   Elevated troponin due to demand ischemia: Low and flat trend in the 30s.  Not consistent with ACS.  Likely related to new onset CHF.   Hypertensive urgency: Reported HTN diagnosis about 6 months ago, took antihypertensives for about 2 weeks and then ran out.  Counseled regarding compliance with low-salt diet, meds, MD follow-ups and weight loss.  BP on arrival 152/117.  Better controlled now on Toprol-XL 25 Mg daily, s/p IV Lasix and Aldactone.  Further medications were initiated as above.  BP better controlled.  Outpatient follow-up.   Mild circumferential pericardial effusion: No tamponade features clinically or on 2D echo.  Management per cardiology.   Tobacco abuse: Cessation counseled.  Prediabetes: A1c 6.2.  Outpatient follow-up.   Microcytosis: Outpatient follow-up.   Goiter: Seen on CTA chest.  Extending substernally.  Clinically euthyroid.  TSH normal/1.999.   Body mass index is 42.23 kg/m./Morbid obesity: Counseled regarding importance of lifestyle modifications and weight loss.           Consultants:   Cardiology   Procedures:   None   Discharge Instructions  Discharge Instructions     (HEART  FAILURE PATIENTS) Call MD:  Anytime you have any of the following symptoms: 1) 3 pound weight gain in 24 hours or 5 pounds in 1 week 2) shortness of breath, with or without a dry hacking cough 3) swelling in the hands, feet or stomach 4) if you have to sleep on extra pillows at night in order to breathe.   Complete by: As directed    Call MD for:  difficulty breathing, headache or visual disturbances   Complete by: As directed    Call MD for:  extreme fatigue   Complete by: As directed    Call MD for:  persistant dizziness or light-headedness   Complete by: As directed    Diet - low sodium heart healthy   Complete by: As directed    Increase activity slowly   Complete by: As directed         Medication List     STOP taking these medications    naproxen sodium 220 MG tablet Commonly known as: ALEVE   NyQuil HBP Cold & Flu 15-6.25-325 MG/15ML Liqd Generic drug: DM-Doxylamine-Acetaminophen       TAKE these medications    dapagliflozin propanediol 10 MG Tabs tablet Commonly known as: FARXIGA Take 1 tablet (10 mg total) by mouth daily. Start taking on: July 20, 2021   furosemide 40 MG tablet Commonly known as: LASIX Take 1 tablet (40 mg total) by mouth daily as needed for edema or fluid (Leg swelling or more than usual shortness of breath).   ivabradine 5 MG Tabs tablet Commonly known as: CORLANOR Take 1 tablet (5 mg total) by mouth 2 (two) times daily with a meal.   metoprolol succinate 25 MG 24 hr tablet Commonly known as: TOPROL-XL Take 1 tablet (25 mg total) by mouth daily. Start taking on: July 20, 2021   sacubitril-valsartan 24-26 MG Commonly known as: ENTRESTO Take 1 tablet by mouth 2 (two) times daily.   spironolactone 25 MG tablet Commonly known as: ALDACTONE Take 1 tablet (25 mg total) by mouth daily. Start taking on: July 20, 2021       Allergies  Allergen Reactions   Amoxicillin Hives      Procedures/Studies: DG Chest 2  View  Result Date: 07/17/2021 CLINICAL DATA:  Shortness of breath for 1 week with cough EXAM: CHEST - 2 VIEW COMPARISON:  12/18/2006 FINDINGS: Cardiomegaly. Mild vascular congestion. No Kerley lines, effusion, or pneumothorax. IMPRESSION: Cardiomegaly and vascular congestion. Electronically Signed   By: Tiburcio Pea M.D.   On: 07/17/2021 11:32   ECHOCARDIOGRAM COMPLETE  Result Date: 07/18/2021    ECHOCARDIOGRAM REPORT   Patient Name:   MAYELIN PANOS Date of Exam: 07/18/2021 Medical Rec #:  [id number]       Height:       65.0 in Accession #:    [id number]      Weight:       253.7 lb Date of Birth:  1971/01/22       BSA:          2.189  m Patient Age:    50 years        BP:           149/52 mmHg Patient Gender: F               HR:           105 bpm. Exam Location:  Inpatient Procedure: 2D Echo, Cardiac Doppler and Color Doppler Indications:    I50.22 Chronic systolic (congestive) heart failure  History:        Patient has no prior history of Echocardiogram examinations.                 CHF.  Sonographer:    MH Referring Phys: [id number] Teddy Spike IMPRESSIONS  1. Left ventricular ejection fraction, by estimation, is <20%. The left ventricle has severely decreased function. The left ventricle demonstrates global hypokinesis. The left ventricular internal cavity size was severely dilated. Left ventricular diastolic function could not be evaluated.  2. Right ventricular systolic function is normal. The right ventricular size is normal. Tricuspid regurgitation signal is inadequate for assessing PA pressure.  3. A small pericardial effusion is present. The pericardial effusion is circumferential. There is no evidence of cardiac tamponade.  4. The mitral valve is normal in structure. No evidence of mitral valve regurgitation. No evidence of mitral stenosis.  5. The aortic valve is normal in structure. Aortic valve regurgitation is not visualized. No aortic stenosis is present.  6. The inferior vena cava is dilated  in size with >50% respiratory variability, suggesting right atrial pressure of 8 mmHg. FINDINGS  Left Ventricle: Left ventricular ejection fraction, by estimation, is <20%. The left ventricle has severely decreased function. The left ventricle demonstrates global hypokinesis. The left ventricular internal cavity size was severely dilated. There is no left ventricular hypertrophy. Left ventricular diastolic function could not be evaluated. Right Ventricle: The right ventricular size is normal. No increase in right ventricular wall thickness. Right ventricular systolic function is normal. Tricuspid regurgitation signal is inadequate for assessing PA pressure. Left Atrium: Left atrial size was normal in size. Right Atrium: Right atrial size was normal in size. Pericardium: A small pericardial effusion is present. The pericardial effusion is circumferential. There is no evidence of cardiac tamponade. Mitral Valve: The mitral valve is normal in structure. No evidence of mitral valve regurgitation. No evidence of mitral valve stenosis. Tricuspid Valve: The tricuspid valve is normal in structure. Tricuspid valve regurgitation is not demonstrated. No evidence of tricuspid stenosis. Aortic Valve: The aortic valve is normal in structure. Aortic valve regurgitation is not visualized. No aortic stenosis is present. Aortic valve mean gradient measures 3.0 mmHg. Aortic valve peak gradient measures 6.2 mmHg. Aortic valve area, by VTI measures 3.27 cm. Pulmonic Valve: The pulmonic valve was normal in structure. Pulmonic valve regurgitation is not visualized. No evidence of pulmonic stenosis. Aorta: The aortic root is normal in size and structure. Venous: The inferior vena cava is dilated in size with greater than 50% respiratory variability, suggesting right atrial pressure of 8 mmHg. IAS/Shunts: No atrial level shunt detected by color flow Doppler.  LEFT VENTRICLE PLAX 2D LVIDd:         7.40 cm     Diastology LVIDs:         6.60  cm     LV e' medial:  5.00 cm/s LV PW:         1.10 cm     LV e' lateral: 6.85 cm/s LV IVS:  1.00 cm LVOT diam:     2.30 cm LV SV:         63 LV SV Index:   29 LVOT Area:     4.15 cm  LV Volumes (MOD) LV vol d, MOD A4C: 99.7 ml LV vol s, MOD A4C: 75.0 ml LV SV MOD A4C:     99.7 ml RIGHT VENTRICLE             IVC RV S prime:     16.00 cm/s  IVC diam: 2.20 cm TAPSE (M-mode): 2.8 cm LEFT ATRIUM             Index       RIGHT ATRIUM          Index LA diam:        2.90 cm 1.32 cm/m  RA Area:     7.20 cm LA Vol (A2C):   39.0 ml 17.82 ml/m RA Volume:   9.57 ml  4.37 ml/m LA Vol (A4C):   65.0 ml 29.70 ml/m LA Biplane Vol: 52.7 ml 24.08 ml/m  AORTIC VALVE                   PULMONIC VALVE AV Area (Vmax):    3.52 cm    PV Vmax:       0.98 m/s AV Area (Vmean):   3.50 cm    PV Peak grad:  3.8 mmHg AV Area (VTI):     3.27 cm AV Vmax:           124.00 cm/s AV Vmean:          82.800 cm/s AV VTI:            0.192 m AV Peak Grad:      6.2 mmHg AV Mean Grad:      3.0 mmHg LVOT Vmax:         105.00 cm/s LVOT Vmean:        69.800 cm/s LVOT VTI:          0.151 m LVOT/AV VTI ratio: 0.79  AORTA Ao Root diam: 3.10 cm Ao Asc diam:  2.70 cm  SHUNTS Systemic VTI:  0.15 m Systemic Diam: 2.30 cm Armanda Magic MD Electronically signed by Armanda Magic MD Signature Date/Time: 07/18/2021/11:35:35 AM    Final    CT Angio Chest/Abd/Pel for Dissection W and/or W/WO  Result Date: 07/17/2021 CLINICAL DATA:  Acute shortness of breath, tachypnea and tachycardia for 1 week. Associated cough. History of pericardial effusion. EXAM: CT ANGIOGRAPHY CHEST, ABDOMEN AND PELVIS TECHNIQUE: Non-contrast CT of the chest was initially obtained. Multidetector CT imaging through the chest, abdomen and pelvis was performed using the standard protocol during bolus administration of intravenous contrast. Multiplanar reconstructed images and MIPs were obtained and reviewed to evaluate the vascular anatomy. CONTRAST:  OMNIPAQUE IOHEXOL 350 MG/ML SOLN  COMPARISON:  None. FINDINGS: CTA CHEST FINDINGS Cardiovascular: Initial noncontrast imaging demonstrates atherosclerotic changes of the aorta. No hyperdense intramural hematoma appreciated by noncontrast imaging. Heart is enlarged. Trace pericardial effusion predominantly anterior. Intact aorta. Normal caliber. Negative for aneurysm or dissection. No mediastinal hemorrhage or hematoma. Limited assessment of the pulmonary arteries. No large central pulmonary embolus appreciated. Mild cardiomegaly.  Native coronary atherosclerosis noted. Central venous structures are patent.  No veno-occlusive process. Mediastinum/Nodes: Heterogeneous enlarged thyroid, larger on the left with substernal extension favored to be goiter. Diffuse thyroid pseudo nodularity. No bulky adenopathy. Slight rightward tracheal deviation related to the enlarged left thyroid lobe. Trachea  and central airways are patent. Esophagus nondilated. No hiatal hernia. Lungs/Pleura: Lingula and minor bibasilar atelectasis. No acute airspace process, significant collapse or consolidation. Negative for edema or interstitial disease. No pleural abnormality, effusion or pneumothorax. Musculoskeletal: No acute osseous finding or chest wall soft tissue abnormality. Review of the MIP images confirms the above findings. CTA ABDOMEN AND PELVIS FINDINGS VASCULAR Aorta: Limited CTA because of contrast opacification and body habitus. Abdominal aorta intact. No aneurysm or dissection. No occlusive process. Minor aortoiliac bifurcation atherosclerosis. Celiac: Widely patent origin including its branches SMA: Widely patent origin including its branches Renals: Main renal arteries appear patent. No accessory renal artery appreciated. IMA: Limited assessment but appears to remain patent off the distal aorta Inflow: Iliac atherosclerosis and tortuosity without occlusive process or inflow disease. Veins: No acute abdominopelvic veno-occlusive process. Review of the MIP images  confirms the above findings. NON-VASCULAR Hepatobiliary: Mild hepatomegaly. No focal hepatic abnormality or biliary obstruction pattern. Hepatic and portal veins are patent. Small gallstones noted layering dependently. Gallbladder nondistended. Common bile duct nondilated. Pancreas: Unremarkable. No pancreatic ductal dilatation or surrounding inflammatory changes. Spleen: Normal in size without focal abnormality. Adrenals/Urinary Tract: Adrenal glands are unremarkable. Kidneys are normal, without renal calculi, focal lesion, or hydronephrosis. Bladder is unremarkable. Stomach/Bowel: Negative for bowel obstruction, significant dilatation, ileus, or free air. Scattered colonic diverticulosis. No acute inflammatory process. Normal appendix demonstrated containing air. No free fluid, fluid collection, hemorrhage, hematoma, abscess or ascites. Lymphatic: No bulky adenopathy. Reproductive: Uterus and bilateral adnexa are unremarkable. Other: No abdominal wall hernia or abnormality. No abdominopelvic ascites. Musculoskeletal: Degenerative changes of the lumbosacral spine and lumbar facet joints. Review of the MIP images confirms the above findings. IMPRESSION: Limited CTA but the thoracoabdominal aorta appears intact without acute dissection, aneurysm, or other acute vascular process. No acute central PE. Cardiomegaly with small pericardial effusion anteriorly. Enlarged thyroid extending substernal with pseudo nodularity compatible with thyroid goiter. Incidental cholelithiasis Colonic diverticulosis without acute inflammatory process Aortic Atherosclerosis (ICD10-I70.0). Electronically Signed   By: Judie Petit.  Shick M.D.   On: 07/17/2021 14:57      Subjective: Feels much better.  Denies complaints.  No dyspnea or hypoxia, even with exertion and on room air.  Discharge Exam:  Vitals:   07/19/21 0439 07/19/21 0500 07/19/21 0813 07/19/21 1200  BP:  (!) 139/93 135/84 131/79  Pulse:  83 87 95  Resp:  20 16 20   Temp:   97.7 F (36.5 C) 97.8 F (36.6 C) 98 F (36.7 C)  TempSrc:  Oral Oral Oral  SpO2:  96% 95% 96%  Weight: 118.6 kg     Height:        General exam: Young female, moderately built and morbidly obese lying comfortably supine in bed without distress Respiratory system: Clear to auscultation. Respiratory effort normal. Cardiovascular system: S1 & S2 heard, RRR.  Unable to appreciate JVD due to body habitus.  No murmurs or gallops heard.  No pedal edema.  Telemetry personally reviewed: Sinus rhythm. Gastrointestinal system: Abdomen is nondistended, soft and nontender. No organomegaly or masses felt. Normal bowel sounds heard. Central nervous system: Alert and oriented. No focal neurological deficits. Extremities: Symmetric 5 x 5 power. Skin: No rashes, lesions or ulcers Psychiatry: Judgement and insight appear normal. Mood & affect appropriate.     The results of significant diagnostics from this hospitalization (including imaging, microbiology, ancillary and laboratory) are listed below for reference.     Microbiology: Recent Results (from the past 240 hour(s))  Resp Panel by RT-PCR (  Flu A&B, Covid) Nasopharyngeal Swab     Status: None   Collection Time: 07/17/21  4:26 PM   Specimen: Nasopharyngeal Swab; Nasopharyngeal(NP) swabs in vial transport medium  Result Value Ref Range Status   SARS Coronavirus 2 by RT PCR NEGATIVE NEGATIVE Final    Comment: (NOTE) SARS-CoV-2 target nucleic acids are NOT DETECTED.  The SARS-CoV-2 RNA is generally detectable in upper respiratory specimens during the acute phase of infection. The lowest concentration of SARS-CoV-2 viral copies this assay can detect is 138 copies/mL. A negative result does not preclude SARS-Cov-2 infection and should not be used as the sole basis for treatment or other patient management decisions. A negative result may occur with  improper specimen collection/handling, submission of specimen other than nasopharyngeal swab,  presence of viral mutation(s) within the areas targeted by this assay, and inadequate number of viral copies(<138 copies/mL). A negative result must be combined with clinical observations, patient history, and epidemiological information. The expected result is Negative.  Fact Sheet for Patients:  BloggerCourse.com  Fact Sheet for Healthcare Providers:  SeriousBroker.it  This test is no t yet approved or cleared by the [place] FDA and  has been authorized for detection and/or diagnosis of SARS-CoV-2 by FDA under an Emergency Use Authorization (EUA). This EUA will remain  in effect (meaning this test can be used) for the duration of the COVID-19 declaration under Section 564(b)(1) of the Act, 21 U.S.C.section 360bbb-3(b)(1), unless the authorization is terminated  or revoked sooner.       Influenza A by PCR NEGATIVE NEGATIVE Final   Influenza B by PCR NEGATIVE NEGATIVE Final    Comment: (NOTE) The Xpert Xpress SARS-CoV-2/FLU/RSV plus assay is intended as an aid in the diagnosis of influenza from Nasopharyngeal swab specimens and should not be used as a sole basis for treatment. Nasal washings and aspirates are unacceptable for Xpert Xpress SARS-CoV-2/FLU/RSV testing.  Fact Sheet for Patients: BloggerCourse.com  Fact Sheet for Healthcare Providers: SeriousBroker.it  This test is not yet approved or cleared by the [place] FDA and has been authorized for detection and/or diagnosis of SARS-CoV-2 by FDA under an Emergency Use Authorization (EUA). This EUA will remain in effect (meaning this test can be used) for the duration of the COVID-19 declaration under Section 564(b)(1) of the Act, 21 U.S.C. section 360bbb-3(b)(1), unless the authorization is terminated or revoked.  Performed at [facility], [address] [place], [place] [address]       Labs: CBC: Recent Labs  Lab 07/17/21 1004 07/17/21 1831 07/18/21 0501  WBC 8.5 9.9 9.3  NEUTROABS 5.0  --   --   HGB 12.8 13.1 13.2  HCT 43.1 43.1 42.6  MCV 76.7* 75.9* 74.3*  PLT 191 189 186    Basic Metabolic Panel: Recent Labs  Lab 07/17/21 1004 07/17/21 1831 07/18/21 0501 07/19/21 0425  NA 146*  --  141 139  K 4.0  --  3.7 4.8  CL 109  --  103 103  CO2 25  --  27 23  GLUCOSE 122*  --  119* 159*  BUN 13  --  13 17  CREATININE 0.60 0.52 0.52 0.71  CALCIUM 9.1  --  9.6 9.5    Liver Function Tests: Recent Labs  Lab 07/17/21 1004 07/18/21 0501  AST 27 24  ALT 33 30  ALKPHOS 64 60  BILITOT 0.9 1.2  PROT 7.5 7.7  ALBUMIN 3.8 3.9    CBG: Recent Labs  Lab 07/17/21 0851  GLUCAP 130*    Hgb A1c Recent Labs    07/17/21 1831  HGBA1C 6.2*      Thyroid function studies Recent Labs    07/18/21 1300  TSH 1.999       Time coordinating discharge: 35 minutes  SIGNED:  Marcellus Scott, MD, FACP, [facility]. [facility]  To contact the attending provider between 7A-7P or the covering provider during after hours 7P-7A, please log into the web site [email or web address] and access using universal [place] password for that web site. If you do not have the password, please call the hospital operator.

## 2021-07-19 NOTE — TOC Progression Note (Signed)
Transition of Care [facility]) - Progression Note    Patient Details  Name: Paula Thompson MRN: [id number] Date of Birth: [birth year]  [facility] CM/SW Contact  Ezella Kell, Olegario Messier, RN Phone Number: 07/19/2021, 11:47 AM  Clinical Narrative: Will check benefit for meds-await response.Noted for de sat screen.      Expected Discharge Plan: Home/Self Care Barriers to Discharge: Continued Medical Work up  Expected Discharge Plan and Services Expected Discharge Plan: Home/Self Care   Discharge Planning Services: CM Consult   Living arrangements for the past 2 months: Single Family Home                                       Social Determinants of Health (SDOH) Interventions    Readmission Risk Interventions No flowsheet data found.

## 2021-07-19 NOTE — Progress Notes (Addendum)
Subjective:  Feels better. Walked to the bathroom with no complaints.   No output is recorded since admission  Objective:  Vital Signs in the last 24 hours: Temp:  [97.7 F (36.5 C)-98.9 F (37.2 C)] 97.7 F (36.5 C) (09/21 0500) Pulse Rate:  [51-104] 83 (09/21 0500) Resp:  [18-20] 20 (09/21 0500) BP: (123-171)/(85-93) 139/93 (09/21 0500) SpO2:  [94 %-97 %] 96 % (09/21 0500) Weight:  [118.6 kg] 118.6 kg (09/21 0439)  Intake/Output from previous day: 09/20 0701 - 09/21 0700 In: 480 [P.O.:480] Out: -   Physical Exam Vitals and nursing note reviewed.  Constitutional:      General: She is not in acute distress.    Appearance: She is well-developed. She is obese.  HENT:     Head: Normocephalic and atraumatic.  Eyes:     Conjunctiva/sclera: Conjunctivae normal.     Pupils: Pupils are equal, round, and reactive to light.  Neck:     Vascular: No JVD.  Cardiovascular:     Rate and Rhythm: Normal rate and regular rhythm.     Pulses: Normal pulses and intact distal pulses.     Heart sounds: No murmur heard. Pulmonary:     Effort: Pulmonary effort is normal.     Breath sounds: Normal breath sounds. No wheezing or rales.  Abdominal:     General: Bowel sounds are normal.     Palpations: Abdomen is soft.     Tenderness: There is no rebound.  Musculoskeletal:        General: No tenderness. Normal range of motion.     Right lower leg: Edema (Trace) present.     Left lower leg: Edema (Trace) present.  Lymphadenopathy:     Cervical: No cervical adenopathy.  Skin:    General: Skin is warm and dry.  Neurological:     Mental Status: She is alert and oriented to person, place, and time.     Cranial Nerves: No cranial nerve deficit.     Lab Results: BMP Recent Labs    07/17/21 1004 07/17/21 1831 07/18/21 0501  NA 146*  --  141  K 4.0  --  3.7  CL 109  --  103  CO2 25  --  27  GLUCOSE 122*  --  119*  BUN 13  --  13  CREATININE 0.60 0.52 0.52  CALCIUM 9.1  --  9.6   GFRNONAA >60 >60 >60    CBC Recent Labs  Lab 07/17/21 1004 07/17/21 1831 07/18/21 0501  WBC 8.5   < > 9.3  RBC 5.62*   < > 5.73*  HGB 12.8   < > 13.2  HCT 43.1   < > 42.6  PLT 191   < > 186  MCV 76.7*   < > 74.3*  MCH 22.8*   < > 23.0*  MCHC 29.7*   < > 31.0  RDW 21.3*   < > 21.2*  LYMPHSABS 2.3  --   --   MONOABS 0.8  --   --   EOSABS 0.3  --   --   BASOSABS 0.0  --   --    < > = values in this interval not displayed.    HEMOGLOBIN A1C Lab Results  Component Value Date   HGBA1C 6.2 (H) 07/17/2021   MPG 131.24 07/17/2021    Cardiac Panel (last 3 results) No results for input(s): CKTOTAL, CKMB, TROPONINI, RELINDX in the last 8760 hours.  BNP (last 3 results) Recent Labs  07/17/21 1004  BNP 278.9*    TSH Recent Labs    07/18/21 1300  TSH 1.999    Lipid Panel  No results found for: CHOL, TRIG, HDL, CHOLHDL, VLDL, LDLCALC, LDLDIRECT   Hepatic Function Panel Recent Labs    07/17/21 1004 07/18/21 0501  PROT 7.5 7.7  ALBUMIN 3.8 3.9  AST 27 24  ALT 33 30  ALKPHOS 64 60  BILITOT 0.9 1.2  BILIDIR 0.1  --   IBILI 0.8  --     Imaging: CXR 07/17/2021: IMPRESSION: Cardiomegaly and vascular congestion.  Cardiac Studies:  EKG 07/17/2021: Sinus tachycardia 108 bpm Old anterior infarct Lateral nonspecific ST-T changes    Echocardiogram 07/18/2021: 1. Left ventricular ejection fraction, by estimation, is <20%. The left  ventricle has severely decreased function. The left ventricle demonstrates  global hypokinesis. The left ventricular internal cavity size was severely  dilated. Left ventricular  diastolic function could not be evaluated.   2. Right ventricular systolic function is normal. The right ventricular  size is normal. Tricuspid regurgitation signal is inadequate for assessing  PA pressure.   3. A small pericardial effusion is present. The pericardial effusion is  circumferential. There is no evidence of cardiac tamponade.   4. The  mitral valve is normal in structure. No evidence of mitral valve  regurgitation. No evidence of mitral stenosis.   5. The aortic valve is normal in structure. Aortic valve regurgitation is  not visualized. No aortic stenosis is present.   6. The inferior vena cava is dilated in size with >50% respiratory  variability, suggesting right atrial pressure of 8 mmHg.   Assessment & Recommendations:  50 y.o. African-American female  with untreated hypertension, tobacco dependence, now acute new diagnosis of acute systolic heart failure  Acute systolic heart failure: EF 20-25%. Severe LV dilatation. EF 20-25%. RV intact. NYHA class III While presentation is acute, this likely has been present for a while. Given severe LV dilatation. Surprisingly, atria are normal size.  Tolerating spironolactone 25 mg daily, metoprolol succinate 25 mg daily, corlanor 5 mg  bid Heart rate appropriately lower today. Adding Farxiga 10 mg and Entresto 24-26 mg bid (provided Cr and K are okay) Can reduce lasix to PO 40 mg prn Strict I/O, daily weights. No output has been documented since hospitalization. Arranged outpatient LHC/RHC next week.   Hypertension: Better controlled  Elevated troponin: Not ACS. Likely due to heart failure. Nonetheless, ischemic cardiomyopathy remains in the differential.    If she ambulated without hypoxia, could consider discharge alter today. Cardiology will sign off. Will arrange outpatient follow up.     Elder Negus, MD Pager: [phone] Office: [phone]

## 2021-07-19 NOTE — Addendum Note (Signed)
Addended by: Elder Negus on: 07/19/2021 08:26 AM   Modules accepted: Orders

## 2021-07-19 NOTE — Telephone Encounter (Signed)
Can you call her and tell her they are in the front please and thank you0

## 2021-07-19 NOTE — Telephone Encounter (Signed)
Pt being discharged from [facility], says cardiologist told her she could receive samples of a few medications here (is a pt of Dr. Rosemary Holms) since she is w/o insurance. Medications requested are Farxiga, metoprolol, furosemide & ivabradine. Would like to be notified if/when this is possible. # is [phone].

## 2021-07-19 NOTE — TOC Transition Note (Signed)
Transition of Care [facility]) - CM/SW Discharge Note   Patient Details  Name: Paula Thompson MRN: [id number] Date of Birth: July 03, 1971  Transition of [facility]) CM/SW Contact:  Dessa Phi, RN Phone Number: 07/19/2021, 12:59 PM   Clinical Narrative:  Benefit check done-cardiology office to asst w/meds. Per John/ Ammie Ferrier $30.00 requires prior Josem Kaufmann [phone], Delene Loll $600.00 Patient has not met deductable after deductable  $30.00, metoprolol $5.00, corlanor $30.00 also requires prior auth [id number]. Doesn't qualify for home 02. No orders or further CM needs.    Final next level of care: Home/Self Care Barriers to Discharge: No Barriers Identified   Patient Goals and CMS Choice Patient states their goals for this hospitalization and ongoing recovery are:: go home CMS Medicare.gov Compare Post Acute Care list provided to:: Patient Choice offered to / list presented to : Patient  Discharge Placement                       Discharge Plan and Services   Discharge Planning Services: CM Consult                                 Social Determinants of Health (SDOH) Interventions     Readmission Risk Interventions No flowsheet data found.

## 2021-07-19 NOTE — Progress Notes (Signed)
SATURATION QUALIFICATIONS: (This note is used to comply with regulatory documentation for home oxygen)  Patient Saturations on Room Air at Rest = 98%  Patient Saturations on Room Air while Ambulating = 96%  Patient Saturations on  0Liters of oxygen while Ambulating = 0  Please briefly explain why patient needs home oxygen: 

## 2021-07-19 NOTE — TOC Benefit Eligibility Note (Signed)
Transition of Care [facility]) Benefit Eligibility Note    Patient Details  Name: Paula Thompson MRN: [id number] Date of Birth: 1971/02/06   Medication/Dose: Wilder Glade 10 mg qd x 30days, entresto 24-26 mg bid, metoprolo 25 mg qd, corlanor 88m bid  Covered?: Yes  Tier: Other  Prescription Coverage Preferred Pharmacy: local pharmacy  Spoke with Person/Company/Phone Number:: [phone] Co-Pay: farxiga $30.00, Entresto $600.00 after deductable met $30.00, metoprolol $5.00, coranor $30.00 also requires prior auth [phone] Prior Approval: Yes (farxiga and corlanor)  Deductible: Unmet       FKerin SalenPhone Number: 07/19/2021, 1:00 PM

## 2021-07-20 ENCOUNTER — Telehealth: Payer: Self-pay | Admitting: Cardiology

## 2021-07-20 ENCOUNTER — Telehealth: Payer: Self-pay

## 2021-07-20 NOTE — Telephone Encounter (Signed)
-----   Message from Manish J Patwardhan, MD sent at 07/19/2021  3:35 PM EDT ----- Regarding: TOC Discharge follow up: TOC: Needed Follow up appt: Scheduled Discharge diagnosis: HFrEF Discharge date: 07/19/2021  Thanks MJP    

## 2021-07-20 NOTE — Telephone Encounter (Signed)
-----   Message from Elder Negus, MD sent at 07/19/2021  3:35 PM EDT ----- Regarding: [facility] Discharge follow up: TOC: Needed Follow up appt: Scheduled Discharge diagnosis: HFrEF Discharge date: 07/19/2021  Thanks MJP

## 2021-07-20 NOTE — Telephone Encounter (Signed)
We do have samples for these medications. Cathlean Cower and Delice Bison aware, they can help.  Thanks MJP

## 2021-07-20 NOTE — Telephone Encounter (Signed)
Pt says she has prescriptions for a few medications, but co-pays too expensive - ivabradine, entresto, farxiga. Would like to discuss possible alternatives.

## 2021-07-24 ENCOUNTER — Other Ambulatory Visit: Payer: Self-pay | Admitting: Cardiology

## 2021-07-24 DIAGNOSIS — I5020 Unspecified systolic (congestive) heart failure: Secondary | ICD-10-CM

## 2021-07-25 ENCOUNTER — Encounter (HOSPITAL_COMMUNITY): Payer: Self-pay | Admitting: Cardiology

## 2021-07-25 ENCOUNTER — Other Ambulatory Visit: Payer: Self-pay

## 2021-07-25 ENCOUNTER — Encounter (HOSPITAL_COMMUNITY)
Admission: RE | Disposition: A | Discharge status: Home / Self Care | Payer: Self-pay | Source: Home / Self Care | Attending: Cardiology

## 2021-07-25 ENCOUNTER — Ambulatory Visit (HOSPITAL_COMMUNITY)
Admission: RE | Admit: 2021-07-25 | Discharge: 2021-07-25 | Disposition: A | Discharge status: Home / Self Care | Payer: Medicaid Other | Attending: Cardiology | Admitting: Cardiology

## 2021-07-25 DIAGNOSIS — I428 Other cardiomyopathies: Secondary | ICD-10-CM | POA: Insufficient documentation

## 2021-07-25 DIAGNOSIS — I5020 Unspecified systolic (congestive) heart failure: Secondary | ICD-10-CM | POA: Insufficient documentation

## 2021-07-25 DIAGNOSIS — Z8249 Family history of ischemic heart disease and other diseases of the circulatory system: Secondary | ICD-10-CM | POA: Insufficient documentation

## 2021-07-25 DIAGNOSIS — Z79899 Other long term (current) drug therapy: Secondary | ICD-10-CM | POA: Insufficient documentation

## 2021-07-25 DIAGNOSIS — Z7984 Long term (current) use of oral hypoglycemic drugs: Secondary | ICD-10-CM | POA: Insufficient documentation

## 2021-07-25 DIAGNOSIS — Z880 Allergy status to penicillin: Secondary | ICD-10-CM | POA: Insufficient documentation

## 2021-07-25 DIAGNOSIS — I110 Hypertensive heart disease with heart failure: Secondary | ICD-10-CM | POA: Insufficient documentation

## 2021-07-25 DIAGNOSIS — F17210 Nicotine dependence, cigarettes, uncomplicated: Secondary | ICD-10-CM | POA: Insufficient documentation

## 2021-07-25 HISTORY — PX: RIGHT/LEFT HEART CATH AND CORONARY ANGIOGRAPHY: CATH118266

## 2021-07-25 LAB — POCT I-STAT EG7
Acid-base deficit: 1 mmol/L (ref 0.0–2.0)
Acid-base deficit: 1 mmol/L (ref 0.0–2.0)
Bicarbonate: 27.3 mmol/L (ref 20.0–28.0)
Bicarbonate: 27.4 mmol/L (ref 20.0–28.0)
Calcium, Ion: 1.29 mmol/L (ref 1.15–1.40)
Calcium, Ion: 1.3 mmol/L (ref 1.15–1.40)
HCT: 46 % (ref 36.0–46.0)
HCT: 46 % (ref 36.0–46.0)
Hemoglobin: 15.6 g/dL — ABNORMAL HIGH (ref 12.0–15.0)
Hemoglobin: 15.6 g/dL — ABNORMAL HIGH (ref 12.0–15.0)
O2 Saturation: 70 %
O2 Saturation: 71 %
Potassium: 4.3 mmol/L (ref 3.5–5.1)
Potassium: 4.4 mmol/L (ref 3.5–5.1)
Sodium: 142 mmol/L (ref 135–145)
Sodium: 142 mmol/L (ref 135–145)
TCO2: 29 mmol/L (ref 22–32)
TCO2: 29 mmol/L (ref 22–32)
pCO2, Ven: 57 mmHg (ref 44.0–60.0)
pCO2, Ven: 57.3 mmHg (ref 44.0–60.0)
pH, Ven: 7.287 (ref 7.250–7.430)
pH, Ven: 7.288 (ref 7.250–7.430)
pO2, Ven: 41 mmHg (ref 32.0–45.0)
pO2, Ven: 42 mmHg (ref 32.0–45.0)

## 2021-07-25 LAB — POCT I-STAT 7, (LYTES, BLD GAS, ICA,H+H)
Acid-base deficit: 1 mmol/L (ref 0.0–2.0)
Bicarbonate: 26.5 mmol/L (ref 20.0–28.0)
Calcium, Ion: 1.24 mmol/L (ref 1.15–1.40)
HCT: 45 % (ref 36.0–46.0)
Hemoglobin: 15.3 g/dL — ABNORMAL HIGH (ref 12.0–15.0)
O2 Saturation: 99 %
Potassium: 4.2 mmol/L (ref 3.5–5.1)
Sodium: 143 mmol/L (ref 135–145)
TCO2: 28 mmol/L (ref 22–32)
pCO2 arterial: 54.1 mmHg — ABNORMAL HIGH (ref 32.0–48.0)
pH, Arterial: 7.298 — ABNORMAL LOW (ref 7.350–7.450)
pO2, Arterial: 167 mmHg — ABNORMAL HIGH (ref 83.0–108.0)

## 2021-07-25 SURGERY — RIGHT/LEFT HEART CATH AND CORONARY ANGIOGRAPHY
Anesthesia: LOCAL

## 2021-07-25 MED ORDER — LIDOCAINE HCL (PF) 1 % IJ SOLN
INTRAMUSCULAR | Status: DC | PRN
  Administered 2021-07-25 (×2): 2 mL

## 2021-07-25 MED ORDER — ASPIRIN 81 MG PO CHEW
81.0000 mg | CHEWABLE_TABLET | ORAL | Status: AC
  Administered 2021-07-25: 81 mg via ORAL

## 2021-07-25 MED ORDER — MIDAZOLAM HCL 2 MG/2ML IJ SOLN
INTRAMUSCULAR | Status: AC
  Filled 2021-07-25: qty 2

## 2021-07-25 MED ORDER — FENTANYL CITRATE (PF) 100 MCG/2ML IJ SOLN
INTRAMUSCULAR | Status: AC
  Filled 2021-07-25: qty 2

## 2021-07-25 MED ORDER — HEPARIN SODIUM (PORCINE) 1000 UNIT/ML IJ SOLN
INTRAMUSCULAR | Status: AC
  Filled 2021-07-25: qty 1

## 2021-07-25 MED ORDER — VERAPAMIL HCL 2.5 MG/ML IV SOLN
INTRAVENOUS | Status: AC
  Filled 2021-07-25: qty 2

## 2021-07-25 MED ORDER — SODIUM CHLORIDE 0.9% FLUSH: 3.0000 mL | Freq: Two times a day (BID) | INTRAVENOUS | Status: DC

## 2021-07-25 MED ORDER — SODIUM CHLORIDE 0.9% FLUSH: 3.0000 mL | INTRAVENOUS | Status: DC | PRN

## 2021-07-25 MED ORDER — HEPARIN (PORCINE) IN NACL 2000-0.9 UNIT/L-% IV SOLN
INTRAVENOUS | Status: DC | PRN
  Administered 2021-07-25: 1000 mL

## 2021-07-25 MED ORDER — VERAPAMIL HCL 2.5 MG/ML IV SOLN: INTRAVENOUS | Status: DC | PRN

## 2021-07-25 MED ORDER — HEPARIN (PORCINE) IN NACL 1000-0.9 UT/500ML-% IV SOLN
INTRAVENOUS | Status: AC
  Filled 2021-07-25: qty 1000

## 2021-07-25 MED ORDER — ASPIRIN 81 MG PO CHEW
81.0000 mg | CHEWABLE_TABLET | ORAL | Status: DC
  Filled 2021-07-25: qty 1

## 2021-07-25 MED ORDER — SODIUM CHLORIDE 0.9 % IV SOLN: INTRAVENOUS | Status: AC

## 2021-07-25 MED ORDER — IOHEXOL 350 MG/ML SOLN
INTRAVENOUS | Status: DC | PRN
  Administered 2021-07-25: 70 mL

## 2021-07-25 MED ORDER — MIDAZOLAM HCL 2 MG/2ML IJ SOLN
INTRAMUSCULAR | Status: DC | PRN
  Administered 2021-07-25: 1 mg via INTRAVENOUS

## 2021-07-25 MED ORDER — SODIUM CHLORIDE 0.9 % IV SOLN: 250.0000 mL | INTRAVENOUS | Status: DC | PRN

## 2021-07-25 MED ORDER — FENTANYL CITRATE (PF) 100 MCG/2ML IJ SOLN
INTRAMUSCULAR | Status: DC | PRN
  Administered 2021-07-25: 25 ug via INTRAVENOUS

## 2021-07-25 MED ORDER — HYDRALAZINE HCL 20 MG/ML IJ SOLN: 10.0000 mg | INTRAMUSCULAR | Status: DC | PRN

## 2021-07-25 MED ORDER — SODIUM CHLORIDE 0.9 % IV SOLN: INTRAVENOUS | Status: DC

## 2021-07-25 MED ORDER — LABETALOL HCL 5 MG/ML IV SOLN: 10.0000 mg | INTRAVENOUS | Status: DC | PRN

## 2021-07-25 MED ORDER — LIDOCAINE HCL (PF) 1 % IJ SOLN
INTRAMUSCULAR | Status: AC
  Filled 2021-07-25: qty 30

## 2021-07-25 MED ORDER — ACETAMINOPHEN 325 MG PO TABS: 650.0000 mg | ORAL_TABLET | ORAL | Status: DC | PRN

## 2021-07-25 MED ORDER — ONDANSETRON HCL 4 MG/2ML IJ SOLN: 4.0000 mg | Freq: Four times a day (QID) | INTRAMUSCULAR | Status: DC | PRN

## 2021-07-25 MED ORDER — HEPARIN SODIUM (PORCINE) 1000 UNIT/ML IJ SOLN
INTRAMUSCULAR | Status: DC | PRN
  Administered 2021-07-25: 5000 [IU] via INTRAVENOUS

## 2021-07-25 SURGICAL SUPPLY — 16 items
CATH BALLN WEDGE 5F 110CM (CATHETERS) ×1 IMPLANT
CATH INFINITI 5 FR 3DRC (CATHETERS) ×1 IMPLANT
CATH INFINITI 5FR ANG PIGTAIL (CATHETERS) ×1 IMPLANT
CATH INFINITI JR4 5F (CATHETERS) ×1 IMPLANT
CATH OPTITORQUE TIG 4.0 5F (CATHETERS) ×1 IMPLANT
DEVICE RAD COMP TR BAND LRG (VASCULAR PRODUCTS) ×1 IMPLANT
GLIDESHEATH SLEND A-KIT 6F 22G (SHEATH) ×1 IMPLANT
GUIDEWIRE INQWIRE 1.5J.035X260 (WIRE) IMPLANT
INQWIRE 1.5J .035X260CM (WIRE) ×2
KIT HEART LEFT (KITS) ×3 IMPLANT
PACK CARDIAC CATHETERIZATION (CUSTOM PROCEDURE TRAY) ×2 IMPLANT
SHEATH GLIDE SLENDER 4/5FR (SHEATH) ×1 IMPLANT
SHEATH PROBE COVER 6X72 (BAG) ×1 IMPLANT
SYR MEDRAD MARK 7 150ML (SYRINGE) ×1 IMPLANT
TRANSDUCER W/STOPCOCK (MISCELLANEOUS) ×2 IMPLANT
TUBING CIL FLEX 10 FLL-RA (TUBING) ×2 IMPLANT

## 2021-07-25 NOTE — Discharge Instructions (Signed)

## 2021-07-25 NOTE — Research (Signed)
Identify Informed Consent   Subject Name: Paula Thompson  Subject met inclusion and exclusion criteria.  The informed consent form, study requirements and expectations were reviewed with the subject and questions and concerns were addressed prior to the signing of the consent form.  The subject verbalized understanding of the trial requirements.  The subject agreed to participate in the Identify trial and signed the informed consent at 0736 on 25-Jul-2021.  The informed consent was obtained prior to performance of any protocol-specific procedures for the subject.  A copy of the signed informed consent was given to the subject and a copy was placed in the subject's medical record.   Carlos American Cathe Bilger

## 2021-07-25 NOTE — H&P (Signed)
Paula Thompson is an 50 y.o. female.   Chief Complaint: HFrEF HPI:   50 y.o. African-American female  with untreated hypertension, tobacco dependence, with HFrEF  Past Medical History:  Diagnosis Date   Anemia    Blood transfusion without reported diagnosis    Gastric ulcer with hemorrhage     Past Surgical History:  Procedure Laterality Date   ESOPHAGOGASTRODUODENOSCOPY N/A 09/26/2014   Procedure: ESOPHAGOGASTRODUODENOSCOPY (EGD);  Surgeon: Theda Belfast, MD;  Location: [facility] ENDOSCOPY;  Service: Endoscopy;  Laterality: N/A;     Family History  Problem Relation Age of Onset   Diabetes Father    Hyperlipidemia Brother    Heart disease Brother    Cancer Maternal Grandfather    Lung cancer Maternal Grandfather    Heart disease Maternal Grandmother     Social History:  reports that she has been smoking cigarettes. She has never used smokeless tobacco. She reports current alcohol use. She reports that she does not use drugs.  Allergies:  Allergies  Allergen Reactions   Amoxicillin Hives    Review of Systems  Constitutional: Negative for decreased appetite, malaise/fatigue, weight gain and weight loss.  HENT:  Negative for congestion.   Eyes:  Negative for visual disturbance.  Cardiovascular:  Positive for dyspnea on exertion. Negative for chest pain, leg swelling, palpitations and syncope.  Respiratory:  Negative for cough.   Endocrine: Negative for cold intolerance.  Hematologic/Lymphatic: Does not bruise/bleed easily.  Skin:  Negative for itching and rash.  Musculoskeletal:  Negative for myalgias.  Gastrointestinal:  Negative for abdominal pain, nausea and vomiting.  Genitourinary:  Negative for dysuria.  Neurological:  Negative for dizziness and weakness.  Psychiatric/Behavioral:  The patient is not nervous/anxious.   All other systems reviewed and are negative.   Blood pressure 104/61, pulse 88, temperature 98.3 F (36.8 C), temperature source Oral, resp. rate  18, height 5' 5.5" (1.664 m), weight 117.9 kg, SpO2 100 %. Body mass index is 42.61 kg/m.  Physical Exam Vitals and nursing note reviewed.  Constitutional:      General: She is not in acute distress.    Appearance: She is well-developed.  HENT:     Head: Normocephalic and atraumatic.  Eyes:     Conjunctiva/sclera: Conjunctivae normal.     Pupils: Pupils are equal, round, and reactive to light.  Neck:     Vascular: No JVD.  Cardiovascular:     Rate and Rhythm: Normal rate and regular rhythm.     Pulses: Normal pulses and intact distal pulses.     Heart sounds: Normal heart sounds. No murmur heard. Pulmonary:     Effort: Pulmonary effort is normal.     Breath sounds: Normal breath sounds. No wheezing or rales.  Abdominal:     General: Bowel sounds are normal.     Palpations: Abdomen is soft.     Tenderness: There is no rebound.  Musculoskeletal:        General: No tenderness. Normal range of motion.     Right lower leg: No edema.     Left lower leg: No edema.  Lymphadenopathy:     Cervical: No cervical adenopathy.  Skin:    General: Skin is warm and dry.  Neurological:     Mental Status: She is alert and oriented to person, place, and time.     Cranial Nerves: No cranial nerve deficit.    No results found for this or any previous visit (from the past 48 hour(s)).  Labs:  Lab Results  Component Value Date   WBC 9.3 07/18/2021   HGB 13.2 07/18/2021   HCT 42.6 07/18/2021   MCV 74.3 (L) 07/18/2021   PLT 186 07/18/2021    Recent Labs  Lab 07/19/21 0425  NA 139  K 4.8  CL 103  CO2 23  BUN 17  CREATININE 0.71  CALCIUM 9.5  GLUCOSE 159*    Lipid Panel  No results found for: CHOL, TRIG, HDL, CHOLHDL, VLDL, LDLCALC  BNP (last 3 results) Recent Labs    07/17/21 1004  BNP 278.9*    HEMOGLOBIN A1C Lab Results  Component Value Date   HGBA1C 6.2 (H) 07/17/2021   MPG 131.24 07/17/2021     TSH Recent Labs    07/18/21 1300  TSH 1.999      Medications Prior to Admission  Medication Sig Dispense Refill   dapagliflozin propanediol (FARXIGA) 10 MG TABS tablet Take 1 tablet (10 mg total) by mouth daily. 30 tablet 0   furosemide (LASIX) 40 MG tablet Take 1 tablet (40 mg total) by mouth daily as needed for edema or fluid (Leg swelling or more than usual shortness of breath). 30 tablet 0   ivabradine (CORLANOR) 5 MG TABS tablet Take 1 tablet (5 mg total) by mouth 2 (two) times daily with a meal. 60 tablet 0   metoprolol succinate (TOPROL-XL) 25 MG 24 hr tablet Take 1 tablet (25 mg total) by mouth daily. 30 tablet 0   sacubitril-valsartan (ENTRESTO) 24-26 MG Take 1 tablet by mouth 2 (two) times daily. 60 tablet 0   spironolactone (ALDACTONE) 25 MG tablet Take 1 tablet (25 mg total) by mouth daily. 30 tablet 0      Current Facility-Administered Medications:    0.9 %  sodium chloride infusion, 250 mL, Intravenous, PRN, Akirah Storck J, MD   0.9 %  sodium chloride infusion, , Intravenous, Continuous, Levette Paulick J, MD   [START ON 07/26/2021] aspirin chewable tablet 81 mg, 81 mg, Oral, Pre-Cath, Yassen Kinnett J, MD   sodium chloride flush (NS) 0.9 % injection 3 mL, 3 mL, Intravenous, Q12H, Hameed Kolar J, MD   sodium chloride flush (NS) 0.9 % injection 3 mL, 3 mL, Intravenous, PRN, Shalee Paolo J, MD   Today's Vitals   07/25/21 0723 07/25/21 0757  BP: 104/61   Pulse: 88   Resp: 18   Temp: 98.3 F (36.8 C)   TempSrc: Oral   SpO2: 100%   Weight: 117.9 kg   Height: 5' 5.5" (1.664 m)   PainSc:  0-No pain   Body mass index is 42.61 kg/m.     CARDIAC STUDIES:    EKG 07/17/2021: Sinus tachycardia 108 bpm Old anterior infarct Lateral nonspecific ST-T changes     Echocardiogram 07/18/2021: 1. Left ventricular ejection fraction, by estimation, is <20%. The left  ventricle has severely decreased function. The left ventricle demonstrates  global hypokinesis. The left ventricular internal  cavity size was severely  dilated. Left ventricular  diastolic function could not be evaluated.   2. Right ventricular systolic function is normal. The right ventricular  size is normal. Tricuspid regurgitation signal is inadequate for assessing  PA pressure.   3. A small pericardial effusion is present. The pericardial effusion is  circumferential. There is no evidence of cardiac tamponade.   4. The mitral valve is normal in structure. No evidence of mitral valve  regurgitation. No evidence of mitral stenosis.   5. The aortic valve is normal in structure. Aortic valve regurgitation  is  not visualized. No aortic stenosis is present.   6. The inferior vena cava is dilated in size with >50% respiratory  variability, suggesting right atrial pressure of 8 mmHg.   Assessment/Plan   50 y.o. African-American female  with untreated hypertension, tobacco dependence, with HFrEF  Plan for RHC/LHC   Elder Negus, MD Pager: [phone] Office: [phone]

## 2021-07-25 NOTE — Interval H&P Note (Signed)
History and Physical Interval Note:  07/25/2021 8:50 AM  Paula Thompson  has presented today for surgery, with the diagnosis of heart failure,HTN.  The various methods of treatment have been discussed with the patient and family. After consideration of risks, benefits and other options for treatment, the patient has consented to  Procedure(s): RIGHT/LEFT HEART CATH AND CORONARY ANGIOGRAPHY (N/A) as a surgical intervention.  The patient's history has been reviewed, patient examined, no change in status, stable for surgery.  I have reviewed the patient's chart and labs.  Questions were answered to the patient's satisfaction.    2012 Appropriate Use Criteria for Diagnostic Catheterization Cardiomyopathies (Right and Left Heart Catheterization OR Right Heart Catheterization Alone With/Without Left Ventriculography and Coronary Angiography) Indication:  Known or suspected cardiomyopathy with or without heart failure A (7) Indication: 93; Score 7   Leon Goodnow J Ronell Boldin

## 2021-07-25 NOTE — Progress Notes (Signed)
Patient was given discharge instructions. She verbalized understanding. 

## 2021-08-01 NOTE — Progress Notes (Signed)
Subjective:   Paula Thompson, female    DOB: 28-Aug-1971, 50 y.o.   MRN: [id number]   Chief Complaint  Patient presents with   Congestive Heart Failure   Hypertension   Follow-up    CATH     HPI  50 y.o. African-American female  with hypertension, nonischemic dilated cardiomyopathy  Patient was diagnosed with nonischemic cardiomyopathy during recent hospitalization at [facility] with exertional dyspnea.  She was started on guideline directed medical treatment.  She still has NYHA class II-III dyspnea symptoms.  Denies any chest pain.  Leg edema is resolved.  She is compliant with medical therapy.  Blood pressure is improving.   Current Outpatient Medications on File Prior to Visit  Medication Sig Dispense Refill   dapagliflozin propanediol (FARXIGA) 10 MG TABS tablet Take 1 tablet (10 mg total) by mouth daily. 30 tablet 0   furosemide (LASIX) 40 MG tablet Take 1 tablet (40 mg total) by mouth daily as needed for edema or fluid (Leg swelling or more than usual shortness of breath). 30 tablet 0   ivabradine (CORLANOR) 5 MG TABS tablet Take 1 tablet (5 mg total) by mouth 2 (two) times daily with a meal. 60 tablet 0   metoprolol succinate (TOPROL-XL) 25 MG 24 hr tablet Take 1 tablet (25 mg total) by mouth daily. 30 tablet 0   sacubitril-valsartan (ENTRESTO) 24-26 MG Take 1 tablet by mouth 2 (two) times daily. 60 tablet 0   spironolactone (ALDACTONE) 25 MG tablet Take 1 tablet (25 mg total) by mouth daily. 30 tablet 0   [DISCONTINUED] bismuth-metronidazole-tetracycline (PYLERA) 140-125-125 MG per capsule Take 3 capsules by mouth 4 (four) times daily -  before meals and at bedtime. 120 capsule 0   No current facility-administered medications on file prior to visit.    Cardiovascular and other pertinent studies:  EKG 08/02/2021: Sinus rhythm 86 bpm  Left ventricular hypertrophy ST-T changes related to LVH  LHC/RHC 07/25/2021: Tortuous vessels with no significant CAD    RA: 9 mmHg RV: 31/7 mmHg PA: 30/20 mmHg, mPAP 25 mmHg PCW: 14 mmHg   CO: 6.3 L/min CI: 2.8 L/min/m2   Mildly decompensated nonischemic cardiomyopathy  Echocardiogram 07/18/2021:  1. Left ventricular ejection fraction, by estimation, is <20%. The left  ventricle has severely decreased function. The left ventricle demonstrates  global hypokinesis. The left ventricular internal cavity size was severely  dilated. Left ventricular  diastolic function could not be evaluated.   2. Right ventricular systolic function is normal. The right ventricular  size is normal. Tricuspid regurgitation signal is inadequate for assessing  PA pressure.   3. A small pericardial effusion is present. The pericardial effusion is  circumferential. There is no evidence of cardiac tamponade.   4. The mitral valve is normal in structure. No evidence of mitral valve  regurgitation. No evidence of mitral stenosis.   5. The aortic valve is normal in structure. Aortic valve regurgitation is  not visualized. No aortic stenosis is present.   6. The inferior vena cava is dilated in size with >50% respiratory  variability, suggesting right atrial pressure of 8 mmHg.    Recent labs: 07/19/2021: Glucose 159, BUN/Cr 17/0.71. EGFR >60. Na/K 139/3.8. Rest of the CMP normal H/H 13/42. MCV 74. Platelets 186 HbA1C 6.2% TSH 1.9 normal    Review of Systems  Cardiovascular:  Positive for dyspnea on exertion. Negative for chest pain, leg swelling, palpitations and syncope.        Vitals:  08/02/21 1102 08/02/21 1108  BP: (!) 144/89 130/72  Pulse: 90 86  Resp: 16   Temp: 98 F (36.7 C)   SpO2: 100%      Body mass index is 45.43 kg/m. Filed Weights   08/02/21 1102  Weight: 273 lb (123.8 kg)     Objective:   Physical Exam Vitals and nursing note reviewed.  Constitutional:      General: She is not in acute distress.    Appearance: She is obese.  Neck:     Vascular: No JVD.  Cardiovascular:     Rate  and Rhythm: Normal rate and regular rhythm.     Heart sounds: Normal heart sounds. No murmur heard. Pulmonary:     Effort: Pulmonary effort is normal.     Breath sounds: Normal breath sounds. No wheezing or rales.  Musculoskeletal:     Right lower leg: No edema.     Left lower leg: No edema.        Assessment & Recommendations:    50 y.o. African-American female  with hypertension, nonischemic dilated cardiomyopathy  HFrEF: Nonischemic dilated cardiomyopathy.  EF <20%.  NYHA class II-III symptoms Increase Entresto to 49-51 bid.  Check BMP in 1 week. Continue metoprolol succinate 25 mg daily, spironolactone 25 mg daily, Corlanor 5 mg twice daily, Farxiga 10 mg daily.  Recommend low-salt diet Repeat echocardiogram in 10/2021.  At that time, if EF is not improved, will consider cardiac MRI and genetic testing. In future, she may also benefit from cardiac rehab after stabilization of her heart failure.  Patient used to work as a Training and development officer at [employer].  Given her significant dyspnea symptoms in setting of dilated cardiomyopathy, she will benefit from [facility].  F/u w/ Lawerance Cruel, PA in 2-3 weeks At that time, consider increasing metoprolol succinate to 50 mg daily, Corlanor to 7.5 mg twice daily.  Thank you for referring the patient to [place]. Please feel free to contact with any questions.   Nigel Mormon, MD Pager: [phone] Office: [phone]

## 2021-08-02 ENCOUNTER — Encounter: Payer: Self-pay | Admitting: Cardiology

## 2021-08-02 ENCOUNTER — Ambulatory Visit: Payer: 59 | Admitting: Cardiology

## 2021-08-02 ENCOUNTER — Other Ambulatory Visit: Payer: Self-pay

## 2021-08-02 VITALS — BP 130/72 | HR 86 | Temp 98.0°F | Resp 16 | Ht 65.0 in | Wt 273.0 lb

## 2021-08-02 DIAGNOSIS — I428 Other cardiomyopathies: Secondary | ICD-10-CM

## 2021-08-02 DIAGNOSIS — I420 Dilated cardiomyopathy: Secondary | ICD-10-CM

## 2021-08-02 DIAGNOSIS — I5020 Unspecified systolic (congestive) heart failure: Secondary | ICD-10-CM

## 2021-08-02 MED ORDER — ENTRESTO 49-51 MG PO TABS: 1.0000 | ORAL_TABLET | Freq: Two times a day (BID) | ORAL | 2 refills | Status: DC

## 2021-08-08 LAB — COMPREHENSIVE METABOLIC PANEL
ALT: 23 IU/L (ref 0–32)
AST: 19 IU/L (ref 0–40)
Albumin/Globulin Ratio: 1.4 (ref 1.2–2.2)
Albumin: 4.4 g/dL (ref 3.8–4.9)
Alkaline Phosphatase: 82 IU/L (ref 44–121)
BUN/Creatinine Ratio: 23 (ref 9–23)
BUN: 18 mg/dL (ref 6–24)
Bilirubin Total: 0.5 mg/dL (ref 0.0–1.2)
CO2: 20 mmol/L (ref 20–29)
Calcium: 9.8 mg/dL (ref 8.7–10.2)
Chloride: 103 mmol/L (ref 96–106)
Creatinine, Ser: 0.78 mg/dL (ref 0.57–1.00)
Globulin, Total: 3.1 g/dL (ref 1.5–4.5)
Glucose: 135 mg/dL — ABNORMAL HIGH (ref 70–99)
Potassium: 4.3 mmol/L (ref 3.5–5.2)
Sodium: 140 mmol/L (ref 134–144)
Total Protein: 7.5 g/dL (ref 6.0–8.5)
eGFR: 91 mL/min/{1.73_m2} (ref >59)

## 2021-08-08 LAB — PRO B NATRIURETIC PEPTIDE: NT-Pro BNP: 110 pg/mL (ref 0–249)

## 2021-08-08 LAB — LIPID PANEL
Chol/HDL Ratio: 5.2 ratio — ABNORMAL HIGH (ref 0.0–4.4)
Cholesterol, Total: 208 mg/dL — ABNORMAL HIGH (ref 100–199)
HDL: 40 mg/dL (ref >39)
LDL Chol Calc (NIH): 139 mg/dL — ABNORMAL HIGH (ref 0–99)
Triglycerides: 159 mg/dL — ABNORMAL HIGH (ref 0–149)
VLDL Cholesterol Cal: 29 mg/dL (ref 5–40)

## 2021-08-08 NOTE — Progress Notes (Signed)
NT BNP, CMP look good. Please discuss lipids in the upcoming visit.  Thanks MJP

## 2021-08-15 ENCOUNTER — Ambulatory Visit: Payer: Managed Care, Other (non HMO) | Admitting: Physician Assistant

## 2021-08-15 NOTE — Progress Notes (Signed)
Subjective:   Anner Crete, female    DOB: 1971/05/24, 50 y.o.   MRN: [id number]   Chief Complaint  Patient presents with   Follow-up   HFrEF    2-3 WEEKS     HPI  50 y.o. African-American female  with hypertension, nonischemic dilated cardiomyopathy  Patient was last seen 08/02/2021 by Dr. Virgina Jock with continued NYHA class II-III dyspnea symptoms.  Last office visit increase Entresto to 49/51 mg p.o. twice daily, repeat BMP remained stable.  Patient now presents for 3-week follow-up.  Patient reports minimal improvement of dyspnea.  Denies chest pain, orthopnea, PND, leg edema.  She is tolerating increased Entresto without issue and blood pressure is now well controlled.  Current Outpatient Medications on File Prior to Visit  Medication Sig Dispense Refill   dapagliflozin propanediol (FARXIGA) 10 MG TABS tablet Take 1 tablet (10 mg total) by mouth daily. 30 tablet 0   furosemide (LASIX) 40 MG tablet Take 1 tablet (40 mg total) by mouth daily as needed for edema or fluid (Leg swelling or more than usual shortness of breath). 30 tablet 0   metoprolol succinate (TOPROL-XL) 25 MG 24 hr tablet Take 1 tablet (25 mg total) by mouth daily. 30 tablet 0   sacubitril-valsartan (ENTRESTO) 49-51 MG Take 1 tablet by mouth 2 (two) times daily. 60 tablet 2   spironolactone (ALDACTONE) 25 MG tablet Take 1 tablet (25 mg total) by mouth daily. 30 tablet 0   [DISCONTINUED] bismuth-metronidazole-tetracycline (PYLERA) 140-125-125 MG per capsule Take 3 capsules by mouth 4 (four) times daily -  before meals and at bedtime. 120 capsule 0   No current facility-administered medications on file prior to visit.    Cardiovascular and other pertinent studies:  EKG 08/02/2021: Sinus rhythm 86 bpm  Left ventricular hypertrophy ST-T changes related to LVH  LHC/RHC 07/25/2021: Tortuous vessels with no significant CAD   RA: 9 mmHg RV: 31/7 mmHg PA: 30/20 mmHg, mPAP 25 mmHg PCW: 14 mmHg   CO: 6.3  L/min CI: 2.8 L/min/m2   Mildly decompensated nonischemic cardiomyopathy  Echocardiogram 07/18/2021:  1. Left ventricular ejection fraction, by estimation, is <20%. The left  ventricle has severely decreased function. The left ventricle demonstrates  global hypokinesis. The left ventricular internal cavity size was severely  dilated. Left ventricular  diastolic function could not be evaluated.   2. Right ventricular systolic function is normal. The right ventricular  size is normal. Tricuspid regurgitation signal is inadequate for assessing  PA pressure.   3. A small pericardial effusion is present. The pericardial effusion is  circumferential. There is no evidence of cardiac tamponade.   4. The mitral valve is normal in structure. No evidence of mitral valve  regurgitation. No evidence of mitral stenosis.   5. The aortic valve is normal in structure. Aortic valve regurgitation is  not visualized. No aortic stenosis is present.   6. The inferior vena cava is dilated in size with >50% respiratory  variability, suggesting right atrial pressure of 8 mmHg.    Recent labs: CMP Latest Ref Rng & Units 08/07/2021 07/25/2021 07/25/2021  Glucose 70 - 99 mg/dL 135(H) - -  BUN 6 - 24 mg/dL 18 - -  Creatinine 0.57 - 1.00 mg/dL 0.78 - -  Sodium 134 - 144 mmol/L 140 143 142  Potassium 3.5 - 5.2 mmol/L 4.3 4.2 4.3  Chloride 96 - 106 mmol/L 103 - -  CO2 20 - 29 mmol/L 20 - -  Calcium 8.7 -  10.2 mg/dL 9.8 - -  Total Protein 6.0 - 8.5 g/dL 7.5 - -  Total Bilirubin 0.0 - 1.2 mg/dL 0.5 - -  Alkaline Phos 44 - 121 IU/L 82 - -  AST 0 - 40 IU/L 19 - -  ALT 0 - 32 IU/L 23 - -   CBC Latest Ref Rng & Units 07/25/2021 07/25/2021 07/25/2021  WBC 4.0 - 10.5 K/uL - - -  Hemoglobin 12.0 - 15.0 g/dL 15.3(H) 15.6(H) 15.6(H)  Hematocrit 36.0 - 46.0 % 45.0 46.0 46.0  Platelets 150 - 400 K/uL - - -   Lipid Panel     Component Value Date/Time   CHOL 208 (H) 08/07/2021 1049   TRIG 159 (H) 08/07/2021 1049   HDL  40 08/07/2021 1049   CHOLHDL 5.2 (H) 08/07/2021 1049   LDLCALC 139 (H) 08/07/2021 1049   HEMOGLOBIN A1C Lab Results  Component Value Date   HGBA1C 6.2 (H) 07/17/2021   MPG 131.24 07/17/2021   TSH Recent Labs    07/18/21 1300  TSH 1.999   Other Labs: 07/19/2021: Glucose 159, BUN/Cr 17/0.71. EGFR >60. Na/K 139/3.8. Rest of the CMP normal H/H 13/42. MCV 74. Platelets 186 HbA1C 6.2% TSH 1.9 normal    Review of Systems  Constitutional: Negative for malaise/fatigue and weight gain.  Cardiovascular:  Positive for dyspnea on exertion (mildly improving). Negative for chest pain, claudication, leg swelling, near-syncope, orthopnea, palpitations, paroxysmal nocturnal dyspnea and syncope.  Respiratory:  Negative for shortness of breath.   Neurological:  Negative for dizziness.        Vitals:   08/16/21 0955  BP: 121/77  Pulse: 87  Resp: 17  Temp: 97.7 F (36.5 C)  SpO2: 94%     Body mass index is 44.7 kg/m. Filed Weights   08/16/21 0955  Weight: 268 lb 9.6 oz (121.8 kg)     Objective:   Physical Exam Vitals and nursing note reviewed.  Constitutional:      General: She is not in acute distress.    Appearance: She is obese.  HENT:     Head: Normocephalic and atraumatic.  Neck:     Vascular: No carotid bruit or JVD.  Cardiovascular:     Rate and Rhythm: Normal rate and regular rhythm.     Pulses: Intact distal pulses.     Heart sounds: Normal heart sounds, S1 normal and S2 normal. No murmur heard.   No gallop.  Pulmonary:     Effort: Pulmonary effort is normal. No respiratory distress.     Breath sounds: Normal breath sounds. No wheezing, rhonchi or rales.  Musculoskeletal:     Right lower leg: No edema.     Left lower leg: No edema.  Neurological:     Mental Status: She is alert.       Assessment & Recommendations:    50 y.o. African-American female  with hypertension, nonischemic dilated cardiomyopathy  HFrEF: Nonischemic dilated cardiomyopathy.   EF <20%.  NYHA class II-III symptoms Continue Entresto 49-51 mg p.o. twice daily, spironolactone 25 mg p.o. daily, metoprolol succinate 25 mg p.o. daily, Farxiga 10 mg p.o. daily Heart rate remains >70 bpm. Increase Corlanor from 5 mg to 7.5 mg p.o. twice daily. We will plan to repeat echocardiogram in January 2023.  If at that time LVEF does not improve we will consider cardiac MRI and genetic testing for any myopathy. Discussed option of cardiac rehab given patient is improving from a heart failure standpoint, however decision was to hold off  at this time and reevaluate at next office visit. Patient's lab pressure is now well controlled.  Patient used to work as a Training and development officer at [employer].  Given her significant dyspnea symptoms in setting of dilated cardiomyopathy, she will benefit from [facility]  Hyperlipidemia: Viewed and discussed with patient lipid profile testing. Given uncontrolled lipids and 10-year ASCVD risk estimated at 6.4%, recommend initiation of statin therapy. Will start atorvastatin 40 mg p.o. nightly Repeat lipid profile testing in 3 months  Follow up in 3 months after echocardiogram and repeat lipid profile testing.    Nigel Mormon, MD Pager: [phone] Office: [phone]

## 2021-08-16 ENCOUNTER — Other Ambulatory Visit: Payer: Self-pay

## 2021-08-16 ENCOUNTER — Ambulatory Visit: Payer: 59 | Admitting: Student

## 2021-08-16 ENCOUNTER — Encounter: Payer: Self-pay | Admitting: Student

## 2021-08-16 VITALS — BP 121/77 | HR 87 | Temp 97.7°F | Resp 17 | Ht 65.0 in | Wt 268.6 lb

## 2021-08-16 DIAGNOSIS — I428 Other cardiomyopathies: Secondary | ICD-10-CM

## 2021-08-16 DIAGNOSIS — E7800 Pure hypercholesterolemia, unspecified: Secondary | ICD-10-CM

## 2021-08-16 DIAGNOSIS — I5020 Unspecified systolic (congestive) heart failure: Secondary | ICD-10-CM

## 2021-08-16 MED ORDER — ATORVASTATIN CALCIUM 40 MG PO TABS: 40.0000 mg | ORAL_TABLET | Freq: Every day | ORAL | 3 refills | Status: DC

## 2021-08-16 MED ORDER — IVABRADINE HCL 7.5 MG PO TABS: 7.5000 mg | ORAL_TABLET | Freq: Two times a day (BID) | ORAL | 3 refills | Status: DC

## 2021-08-18 ENCOUNTER — Other Ambulatory Visit: Payer: Self-pay

## 2021-08-18 DIAGNOSIS — I5020 Unspecified systolic (congestive) heart failure: Secondary | ICD-10-CM

## 2021-08-18 MED ORDER — SPIRONOLACTONE 25 MG PO TABS: 25.0000 mg | ORAL_TABLET | Freq: Every day | ORAL | 0 refills | Status: DC

## 2021-08-18 MED ORDER — METOPROLOL SUCCINATE ER 25 MG PO TB24: 25.0000 mg | ORAL_TABLET | Freq: Every day | ORAL | 0 refills | Status: DC

## 2021-08-18 MED ORDER — IVABRADINE HCL 7.5 MG PO TABS: 7.5000 mg | ORAL_TABLET | Freq: Two times a day (BID) | ORAL | 3 refills | Status: DC

## 2021-08-18 MED ORDER — DAPAGLIFLOZIN PROPANEDIOL 10 MG PO TABS: 10.0000 mg | ORAL_TABLET | Freq: Every day | ORAL | 0 refills | Status: DC

## 2021-08-18 MED ORDER — FUROSEMIDE 40 MG PO TABS
40.0000 mg | ORAL_TABLET | Freq: Every day | ORAL | 0 refills | Status: DC | PRN
Start: 2021-08-18 — End: 2021-09-11

## 2021-08-25 ENCOUNTER — Other Ambulatory Visit: Payer: Self-pay

## 2021-08-25 MED ORDER — DAPAGLIFLOZIN PROPANEDIOL 10 MG PO TABS: 10.0000 mg | ORAL_TABLET | Freq: Every day | ORAL | 0 refills | Status: DC

## 2021-08-25 MED ORDER — IVABRADINE HCL 7.5 MG PO TABS: 7.5000 mg | ORAL_TABLET | Freq: Two times a day (BID) | ORAL | 3 refills | Status: DC

## 2021-08-28 ENCOUNTER — Ambulatory Visit: Payer: Medicaid Other | Admitting: Physician Assistant

## 2021-09-05 ENCOUNTER — Other Ambulatory Visit: Payer: Self-pay | Admitting: Student

## 2021-09-05 ENCOUNTER — Other Ambulatory Visit: Payer: Self-pay | Admitting: Cardiology

## 2021-09-09 ENCOUNTER — Other Ambulatory Visit: Payer: Self-pay | Admitting: Student

## 2021-09-11 ENCOUNTER — Other Ambulatory Visit: Payer: Self-pay | Admitting: Cardiology

## 2021-09-28 ENCOUNTER — Other Ambulatory Visit: Payer: Self-pay | Admitting: Student

## 2021-11-02 ENCOUNTER — Other Ambulatory Visit: Payer: Self-pay

## 2021-11-02 ENCOUNTER — Ambulatory Visit: Payer: 59

## 2021-11-02 ENCOUNTER — Other Ambulatory Visit: Payer: Self-pay | Admitting: Student

## 2021-11-02 DIAGNOSIS — I5020 Unspecified systolic (congestive) heart failure: Secondary | ICD-10-CM

## 2021-11-06 NOTE — Progress Notes (Signed)
Seeing you soon

## 2021-11-15 NOTE — Progress Notes (Signed)
Subjective:   Paula Thompson, female    DOB: Mar 02, 1971, 51 y.o.   MRN: [id number]   Chief Complaint  Patient presents with   HFrEF   Cardiomyopathy   Follow-up    3 month     HPI  51 y.o. African-American female  with hypertension, nonischemic dilated cardiomyopathy  Patient presents for 3 month follow up. At last office visit increased Corlanor from 5 mg to 7.5 mg p.o. BID. Also ordered repeat echocardiogram which revealed LVEF improved from <20% to 40-45%, mild LVH, and G1DD. Also at last visit started atorvastatin 40 mg daily. Repeat lipid profile testing has unfortunately not been done.  Patient is feeling well overall, however she does report vaginal itching over the last 1 to 2 weeks.  Denies chest pain, palpitations, syncope, near syncope.  Denies orthopnea, PND, leg edema.  Patient reports dyspnea has significantly improved since last office visit.   Current Outpatient Medications on File Prior to Visit  Medication Sig Dispense Refill   atorvastatin (LIPITOR) 40 MG tablet Take 1 tablet (40 mg total) by mouth daily. 90 tablet 3   furosemide (LASIX) 40 MG tablet TAKE 1 TABLET (40 MG TOTAL) BY MOUTH DAILY AS NEEDED FOR EDEMA OR FLUID (LEG SWELLING OR MORE THAN USUAL SHORTNESS OF BREATH). 90 tablet 0   metoprolol succinate (TOPROL-XL) 25 MG 24 hr tablet TAKE 1 TABLET (25 MG TOTAL) BY MOUTH DAILY. 90 tablet 0   sacubitril-valsartan (ENTRESTO) 49-51 MG Take 1 tablet by mouth 2 (two) times daily. 60 tablet 2   spironolactone (ALDACTONE) 25 MG tablet TAKE 1 TABLET (25 MG TOTAL) BY MOUTH DAILY. 90 tablet 0   [DISCONTINUED] bismuth-metronidazole-tetracycline (PYLERA) 140-125-125 MG per capsule Take 3 capsules by mouth 4 (four) times daily -  before meals and at bedtime. 120 capsule 0   No current facility-administered medications on file prior to visit.    Cardiovascular and other pertinent studies: PCV ECHOCARDIOGRAM COMPLETE 11/02/2021 Mildly depressed LV systolic function  with visual EF 40-45%. Left ventricle cavity is dilated. Mild left ventricular hypertrophy. Hypokinetic global wall motion. Doppler evidence of grade I (impaired) diastolic dysfunction, normal LAP. No significant valvular heart disease. Compared to study 07/18/2021 LVEF has improved from <20% to 40-45% otherwise no significant change.   EKG 08/02/2021: Sinus rhythm 86 bpm  Left ventricular hypertrophy ST-T changes related to LVH  LHC/RHC 07/25/2021: Tortuous vessels with no significant CAD   RA: 9 mmHg RV: 31/7 mmHg PA: 30/20 mmHg, mPAP 25 mmHg PCW: 14 mmHg   CO: 6.3 L/min CI: 2.8 L/min/m2   Mildly decompensated nonischemic cardiomyopathy  Echocardiogram 07/18/2021:  1. Left ventricular ejection fraction, by estimation, is <20%. The left  ventricle has severely decreased function. The left ventricle demonstrates  global hypokinesis. The left ventricular internal cavity size was severely  dilated. Left ventricular  diastolic function could not be evaluated.   2. Right ventricular systolic function is normal. The right ventricular  size is normal. Tricuspid regurgitation signal is inadequate for assessing  PA pressure.   3. A small pericardial effusion is present. The pericardial effusion is  circumferential. There is no evidence of cardiac tamponade.   4. The mitral valve is normal in structure. No evidence of mitral valve  regurgitation. No evidence of mitral stenosis.   5. The aortic valve is normal in structure. Aortic valve regurgitation is  not visualized. No aortic stenosis is present.   6. The inferior vena cava is dilated in size with >50% respiratory  variability, suggesting right atrial pressure of 8 mmHg.    Recent labs: CMP Latest Ref Rng & Units 08/07/2021 07/25/2021 07/25/2021  Glucose 70 - 99 mg/dL 135(H) - -  BUN 6 - 24 mg/dL 18 - -  Creatinine 0.57 - 1.00 mg/dL 0.78 - -  Sodium 134 - 144 mmol/L 140 143 142  Potassium 3.5 - 5.2 mmol/L 4.3 4.2 4.3  Chloride 96  - 106 mmol/L 103 - -  CO2 20 - 29 mmol/L 20 - -  Calcium 8.7 - 10.2 mg/dL 9.8 - -  Total Protein 6.0 - 8.5 g/dL 7.5 - -  Total Bilirubin 0.0 - 1.2 mg/dL 0.5 - -  Alkaline Phos 44 - 121 IU/L 82 - -  AST 0 - 40 IU/L 19 - -  ALT 0 - 32 IU/L 23 - -   CBC Latest Ref Rng & Units 07/25/2021 07/25/2021 07/25/2021  WBC 4.0 - 10.5 K/uL - - -  Hemoglobin 12.0 - 15.0 g/dL 15.3(H) 15.6(H) 15.6(H)  Hematocrit 36.0 - 46.0 % 45.0 46.0 46.0  Platelets 150 - 400 K/uL - - -   Lipid Panel     Component Value Date/Time   CHOL 208 (H) 08/07/2021 1049   TRIG 159 (H) 08/07/2021 1049   HDL 40 08/07/2021 1049   CHOLHDL 5.2 (H) 08/07/2021 1049   LDLCALC 139 (H) 08/07/2021 1049   HEMOGLOBIN A1C Lab Results  Component Value Date   HGBA1C 6.2 (H) 07/17/2021   MPG 131.24 07/17/2021   TSH Recent Labs    07/18/21 1300  TSH 1.999   Other Labs: 07/19/2021: Glucose 159, BUN/Cr 17/0.71. EGFR >60. Na/K 139/3.8. Rest of the CMP normal H/H 13/42. MCV 74. Platelets 186 HbA1C 6.2% TSH 1.9 normal    Review of Systems  Constitutional: Negative for malaise/fatigue and weight gain.  Cardiovascular:  Positive for dyspnea on exertion (improved). Negative for chest pain, claudication, leg swelling, near-syncope, orthopnea, palpitations, paroxysmal nocturnal dyspnea and syncope.  Respiratory:  Negative for shortness of breath.   Neurological:  Negative for dizziness.        Vitals:   11/16/21 0954  BP: 111/67  Pulse: 100  Resp: 16  Temp: 98 F (36.7 C)  SpO2: 95%     Body mass index is 45.1 kg/m. Filed Weights   11/16/21 0954  Weight: 271 lb (122.9 kg)     Objective:   Physical Exam Vitals and nursing note reviewed.  Constitutional:      General: She is not in acute distress.    Appearance: She is obese.  HENT:     Head: Normocephalic and atraumatic.  Neck:     Vascular: No carotid bruit or JVD.  Cardiovascular:     Rate and Rhythm: Normal rate and regular rhythm.     Pulses: Intact  distal pulses.     Heart sounds: Normal heart sounds, S1 normal and S2 normal. No murmur heard.   No gallop.  Pulmonary:     Effort: Pulmonary effort is normal. No respiratory distress.     Breath sounds: Normal breath sounds. No wheezing, rhonchi or rales.  Musculoskeletal:     Right lower leg: No edema.     Left lower leg: No edema.  Neurological:     Mental Status: She is alert.  Physical exam unchanged.  Previous office visit.     Assessment & Recommendations:    51 y.o. African-American female  with hypertension, nonischemic dilated cardiomyopathy  HFrEF: Nonischemic dilated cardiomyopathy.   Echo 10/2021  revealed LVEF improved from <20% to 40-45%. Dyspnea has significantly improved since last office visit. Patient has been without Corlanor for an unknown period of time.  Heart rate >70 bpm. Will refill Corlanor 7.5 mg p.o. twice daily As LVEF improved do not feel cardiac MRI and genetic testing for myopathies indicated at this time. Patient's blood pressure is well controlled. Will continue current medical therapy with Entresto, metoprolol, spironolactone, Corlanor, and [place].  Patient does report vaginal itching over the last 1 to 2 weeks.  Encouraged her to follow-up with PCP.  Advised patient regarding potential side effects of [place], she will notify our office if PCP feels it is appropriate to discontinue [place].  Hyperlipidemia: Given uncontrolled lipids and 10-year ASCVD risk estimated at 6.4%, recommend initiation of statin therapy last office visit. Is tolerating atorvastatin without issue.  We will obtain repeat lipid profile testing  Follow-up in 6 months, sooner if needed, for HFrEF, hypertension, hyperlipidemia.   Alethia Berthold, PA-C 11/16/2021, 10:21 AM Office: [phone]

## 2021-11-16 ENCOUNTER — Encounter: Payer: Self-pay | Admitting: Student

## 2021-11-16 ENCOUNTER — Telehealth: Payer: Medicaid Other | Admitting: Family Medicine

## 2021-11-16 ENCOUNTER — Other Ambulatory Visit: Payer: Self-pay

## 2021-11-16 ENCOUNTER — Ambulatory Visit: Payer: 59 | Admitting: Student

## 2021-11-16 VITALS — BP 111/67 | HR 100 | Temp 98.0°F | Resp 16 | Ht 65.0 in | Wt 271.0 lb

## 2021-11-16 DIAGNOSIS — B3731 Acute candidiasis of vulva and vagina: Secondary | ICD-10-CM

## 2021-11-16 DIAGNOSIS — I5020 Unspecified systolic (congestive) heart failure: Secondary | ICD-10-CM

## 2021-11-16 DIAGNOSIS — E7800 Pure hypercholesterolemia, unspecified: Secondary | ICD-10-CM

## 2021-11-16 MED ORDER — FLUCONAZOLE 150 MG PO TABS: 150.0000 mg | ORAL_TABLET | Freq: Once | ORAL | 0 refills | Status: AC

## 2021-11-16 MED ORDER — DAPAGLIFLOZIN PROPANEDIOL 10 MG PO TABS: 10.0000 mg | ORAL_TABLET | Freq: Every day | ORAL | 3 refills | Status: DC

## 2021-11-16 MED ORDER — IVABRADINE HCL 7.5 MG PO TABS: ORAL_TABLET | ORAL | 3 refills | Status: DC

## 2021-11-16 NOTE — Progress Notes (Signed)

## 2021-12-12 ENCOUNTER — Other Ambulatory Visit: Payer: Self-pay | Admitting: Student

## 2021-12-20 ENCOUNTER — Encounter (HOSPITAL_BASED_OUTPATIENT_CLINIC_OR_DEPARTMENT_OTHER): Payer: Self-pay | Admitting: Nurse Practitioner

## 2021-12-20 ENCOUNTER — Other Ambulatory Visit (HOSPITAL_BASED_OUTPATIENT_CLINIC_OR_DEPARTMENT_OTHER): Payer: Self-pay

## 2021-12-20 ENCOUNTER — Encounter (HOSPITAL_BASED_OUTPATIENT_CLINIC_OR_DEPARTMENT_OTHER): Payer: Self-pay

## 2021-12-20 ENCOUNTER — Other Ambulatory Visit: Payer: Self-pay

## 2021-12-20 ENCOUNTER — Ambulatory Visit (INDEPENDENT_AMBULATORY_CARE_PROVIDER_SITE_OTHER): Payer: 59 | Admitting: Nurse Practitioner

## 2021-12-20 VITALS — BP 122/82 | HR 96 | Ht 66.0 in | Wt 275.9 lb

## 2021-12-20 DIAGNOSIS — I5020 Unspecified systolic (congestive) heart failure: Secondary | ICD-10-CM | POA: Diagnosis not present

## 2021-12-20 DIAGNOSIS — Z23 Encounter for immunization: Secondary | ICD-10-CM

## 2021-12-20 DIAGNOSIS — I10 Essential (primary) hypertension: Secondary | ICD-10-CM

## 2021-12-20 DIAGNOSIS — G8929 Other chronic pain: Secondary | ICD-10-CM

## 2021-12-20 DIAGNOSIS — M25511 Pain in right shoulder: Secondary | ICD-10-CM

## 2021-12-20 DIAGNOSIS — I428 Other cardiomyopathies: Secondary | ICD-10-CM

## 2021-12-20 DIAGNOSIS — I509 Heart failure, unspecified: Secondary | ICD-10-CM | POA: Diagnosis not present

## 2021-12-20 DIAGNOSIS — H539 Unspecified visual disturbance: Secondary | ICD-10-CM

## 2021-12-20 DIAGNOSIS — M25512 Pain in left shoulder: Secondary | ICD-10-CM

## 2021-12-20 MED ORDER — DICLOFENAC SODIUM 1 % EX GEL
4.0000 g | Freq: Four times a day (QID) | CUTANEOUS | 11 refills | Status: AC
  Filled 2021-12-20: qty 100, 15d supply, fill #0

## 2021-12-20 MED ORDER — DAPAGLIFLOZIN PROPANEDIOL 10 MG PO TABS
10.0000 mg | ORAL_TABLET | Freq: Every day | ORAL | 3 refills | Status: DC
  Filled 2021-12-20: qty 90, 90d supply, fill #0
  Filled 2022-04-16: qty 90, 90d supply, fill #1
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
## 2021-12-20 NOTE — Patient Instructions (Signed)
Thank you for choosing [place] at [facility] for your Primary Care needs. I am excited for the opportunity to partner with you to meet your health care goals. It was a pleasure meeting you today!  Recommendations from today's visit: I have sent the referral to Dr. Beather Arbour with orthopedics. They will call you to schedule I sent in the diclofenac gel to use to help with shoulder pain See the back of this paperwork for the stretches for the shoulders.  I have sent the referral to the eye doctor Call cardiology today to see if they can get you more samples of the medication.  Let me know if your mood seems to not improve or gets any worse  Information on diet, exercise, and health maintenance recommendations are listed below. This is information to help you be sure you are on track for optimal health and monitoring.   Please look over this and let us know if you have any questions or if you have completed any of the health maintenance outside of [place] so that we can be sure your records are up to date.  ___________________________________________________________ About Me: I am an Adult-Geriatric Nurse Practitioner with a background in caring for patients for more than 20 years with a strong intensive care background. I provide primary care and sports medicine services to patients age 45 and older within this office. My education had a strong focus on caring for the older adult population, which I am passionate about. I am also the director of the APP Fellowship with [facility].   My desire is to provide you with the best service through preventive medicine and supportive care. I consider you a part of the medical team and value your input. I work diligently to ensure that you are heard and your needs are met in a safe and effective manner. I want you to feel comfortable with me as your provider and want you to know that your health concerns are important to me.  For  your information, our office hours are: Monday, Tuesday, and Thursday 8:00 AM - 5:00 PM Wednesday and Friday 8:00 AM - 12:00 PM.   In my time away from the office I am teaching new APP's within the system and am unavailable, but my partner, Dr. Burnard Bunting is in the office for emergent needs.   If you have questions or concerns, please call our office at [phone] or send [place] a MyChart message and we will respond as quickly as possible.  ____________________________________________________________ MyChart:  For all urgent or time sensitive needs we ask that you please call the office to avoid delays. Our number is [phone]. MyChart is not constantly monitored and due to the large volume of messages a day, replies may take up to 72 business hours.  MyChart Policy: MyChart allows for you to see your visit notes, after visit summary, provider recommendations, lab and tests results, make an appointment, request refills, and contact your provider or the office for non-urgent questions or concerns. Providers are seeing patients during normal business hours and do not have built in time to review MyChart messages.  We ask that you allow a minimum of 3 business days for responses to [employer]. For this reason, please do not send urgent requests through [place]. Please call the office at [phone]. New and ongoing conditions may require a visit. We have virtual and in person visit available for your convenience.  Complex MyChart concerns may require a visit. Your provider  may request you schedule a virtual or in person visit to ensure we are providing the best care possible. MyChart messages sent after 11:00 AM on Friday will not be received by the provider until Monday morning.    Lab and Test Results: You will receive your lab and test results on MyChart as soon as they are completed and results have been sent by the lab or testing facility. Due to this service, you will receive your  results BEFORE your provider.  I review lab and tests results each morning prior to seeing patients. Some results require collaboration with other providers to ensure you are receiving the most appropriate care. For this reason, we ask that you please allow a minimum of 3-5 business days from the time the ALL results have been received for your provider to receive and review lab and test results and contact you about these.  Most lab and test result comments from the provider will be sent through [place]. Your provider may recommend changes to the plan of care, follow-up visits, repeat testing, ask questions, or request an office visit to discuss these results. You may reply directly to this message or call the office at [phone] to provide information for the provider or set up an appointment. In some instances, you will be called with test results and recommendations. Please let us know if this is preferred and we will make note of this in your chart to provide this for you.    If you have not heard a response to your lab or test results in 5 business days from all results returning to [place], please call the office to let us know. We ask that you please avoid calling prior to this time unless there is an emergent concern. Due to high call volumes, this can delay the resulting process.  After Hours: For all non-emergency after hours needs, please call the office at [phone] and select the option to reach the on-call provider service. On-call services are shared between multiple [place] offices and therefore it will not be possible to speak directly with your provider. On-call providers may provide medical advice and recommendations, but are unable to provide refills for maintenance medications.  For all emergency or urgent medical needs after normal business hours, we recommend that you seek care at the closest Urgent Care or Emergency Department to ensure appropriate treatment in a timely  manner.  [facility] has a 24 hour emergency room located on the ground floor for your convenience.   Urgent Concerns During the Business Day Providers are seeing patients from 8AM to [place] with a busy schedule and are most often not able to respond to non-urgent calls until the end of the day or the next business day. If you should have URGENT concerns during the day, please call and speak to the nurse or schedule a same day appointment so that we can address your concern without delay.   Thank you, again, for choosing me as your health care partner. I appreciate your trust and look forward to learning more about you.   Worthy Keeler, DNP, AGNP-c ___________________________________________________________  Health Maintenance Recommendations Screening Testing Mammogram Every 1 -2 years based on history and risk factors Starting at age 59 Pap Smear Ages 21-39 every 3 years Ages 36-65 every 5 years with HPV testing More frequent testing may be required based on results and history Colon Cancer Screening Every 1-10 years based on test performed, risk factors, and history Starting at  age 25 Bone Density Screening Every 2-10 years based on history Starting at age 81 for women Recommendations for men differ based on medication usage, history, and risk factors AAA Screening One time ultrasound Men 17-83 years old who have every smoked Lung Cancer Screening Low Dose Lung CT every 12 months Age 1-80 years with a 30 pack-year smoking history who still smoke or who have quit within the last 15 years  Screening Labs Routine  Labs: Complete Blood Count (CBC), Complete Metabolic Panel (CMP), Cholesterol (Lipid Panel) Every 6-12 months based on history and medications May be recommended more frequently based on current conditions or previous results Hemoglobin A1c Lab Every 3-12 months based on history and previous results Starting at age 153 or earlier with diagnosis  of diabetes, high cholesterol, BMI >26, and/or risk factors Frequent monitoring for patients with diabetes to ensure blood sugar control Thyroid Panel (TSH w/ T3 & T4) Every 6 months based on history, symptoms, and risk factors May be repeated more often if on medication HIV One time testing for all patients 59 and older May be repeated more frequently for patients with increased risk factors or exposure Hepatitis C One time testing for all patients 16 and older May be repeated more frequently for patients with increased risk factors or exposure Gonorrhea, Chlamydia Every 12 months for all sexually active persons 13-24 years Additional monitoring may be recommended for those who are considered high risk or who have symptoms PSA Men 68-17 years old with risk factors Additional screening may be recommended from age 79-69 based on risk factors, symptoms, and history  Vaccine Recommendations Tetanus Booster All adults every 10 years Flu Vaccine All patients 6 months and older every year COVID Vaccine All patients 12 years and older Initial dosing with booster May recommend additional booster based on age and health history HPV Vaccine 2 doses all patients age 15-26 Dosing may be considered for patients over 26 Shingles Vaccine (Shingrix) 2 doses all adults 58 years and older Pneumonia (Pneumovax 23) All adults 7 years and older May recommend earlier dosing based on health history Pneumonia (Prevnar 79) All adults 16 years and older Dosed 1 year after Pneumovax 23  Additional Screening, Testing, and Vaccinations may be recommended on an individualized basis based on family history, health history, risk factors, and/or exposure.  __________________________________________________________  Diet Recommendations for All Patients  I recommend that all patients maintain a diet low in saturated fats, carbohydrates, and cholesterol. While this can be challenging at first, it is not  impossible and small changes can make big differences.  Things to try: Decreasing the amount of soda, sweet tea, and/or juice to one or less per day and replace with water While water is always the first choice, if you do not like water you may consider adding a water additive without sugar to improve the taste other sugar free drinks Replace potatoes with a brightly colored vegetable at dinner Use healthy oils, such as canola oil or olive oil, instead of butter or hard margarine Limit your bread intake to two pieces or less a day Replace regular pasta with low carb pasta options Bake, broil, or grill foods instead of frying Monitor portion sizes  Eat smaller, more frequent meals throughout the day instead of large meals  An important thing to remember is, if you love foods that are not great for your health, you don't have to give them up completely. Instead, allow these foods to be a reward when you have done  well. Allowing yourself to still have special treats every once in a while is a nice way to tell yourself thank you for working hard to keep yourself healthy.   Also remember that every day is a new day. If you have a bad day and "fall off the wagon", you can still climb right back up and keep moving along on your journey!  We have resources available to help you!  Some websites that may be helpful include: www.http://[employer].biz/  Www.VeryWellFit.com _____________________________________________________________  Activity Recommendations for All Patients  I recommend that all adults get at least 20 minutes of moderate physical activity that elevates your heart rate at least 5 days out of the week.  Some examples include: Walking or jogging at a pace that allows you to carry on a conversation Cycling (stationary bike or outdoors) Water aerobics Yoga Weight lifting Dancing If physical limitations prevent you from putting stress on your joints, exercise in a pool or seated in a chair  are excellent options.  Do determine your MAXIMUM heart rate for activity: YOUR AGE - 220 = MAX HeartRate   Remember! Do not push yourself too hard.  Start slowly and build up your pace, speed, weight, time in exercise, etc.  Allow your body to rest between exercise and get good sleep. You will need more water than normal when you are exerting yourself. Do not wait until you are thirsty to drink. Drink with a purpose of getting in at least 8, 8 ounce glasses of water a day plus more depending on how much you exercise and sweat.    If you begin to develop dizziness, chest pain, abdominal pain, jaw pain, shortness of breath, headache, vision changes, lightheadedness, or other concerning symptoms, stop the activity and allow your body to rest. If your symptoms are severe, seek emergency evaluation immediately. If your symptoms are concerning, but not severe, please let us know so that we can recommend further evaluation.

## 2021-12-21 ENCOUNTER — Other Ambulatory Visit (HOSPITAL_BASED_OUTPATIENT_CLINIC_OR_DEPARTMENT_OTHER): Payer: Self-pay

## 2021-12-21 DIAGNOSIS — H539 Unspecified visual disturbance: Secondary | ICD-10-CM | POA: Insufficient documentation

## 2021-12-21 DIAGNOSIS — G8929 Other chronic pain: Secondary | ICD-10-CM | POA: Insufficient documentation

## 2021-12-21 DIAGNOSIS — M25512 Pain in left shoulder: Secondary | ICD-10-CM | POA: Insufficient documentation

## 2021-12-21 NOTE — Assessment & Plan Note (Signed)
Referral for ophthalmology placed today.

## 2021-12-21 NOTE — Assessment & Plan Note (Signed)
Chronic.  Blood pressure well controlled today at 122/82. No alarm symptoms present today. Recent labs reviewed. Recommend continuation of current medications with no changes today. Continue to follow with cardiology.  We will continue to collaborate care.

## 2021-12-21 NOTE — Assessment & Plan Note (Signed)
Bilateral shoulder pain with limitations to range of motion. 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 Discussed with patient the continuation of osteoarthritis and the chronic nature of this condition. Recommend evaluation with orthopedics for further recommendations. We will provide gentle stretches to perform at home while awaiting orthopedics evaluation.  Patient unable to take NSAIDs orally due to history of GI bleed/GI ulcers.  Recommend use of diclofenac topically to the shoulders to help with pain and swelling as needed.

## 2021-12-21 NOTE — Assessment & Plan Note (Signed)
New diagnosis September 2022.  Appears to be currently well controlled with maximum medication management. Patient is currently on statin therapy with Lipitor and taking Farxiga, Lasix, Corlanor, metoprolol, Entresto, and spironolactone on a routine basis.  Medications tolerated well. No alarm symptoms present today. We will continue to collaborate with cardiology for care.

## 2021-12-21 NOTE — Assessment & Plan Note (Signed)
New diagnosis September 2022. Patient is currently followed with cardiology closely and is on maximum treatment for heart failure. No alarm symptoms present today. Continue collaborate with cardiology.

## 2021-12-21 NOTE — Assessment & Plan Note (Signed)
New diagnosis September 2022. Currently followed by cardiology and maximized with medication management.  Appears to be doing quite well with no alarm symptoms present today. Recommend continuation of current medications and monitoring for new or worsening symptoms of heart failure. Continue collaboration with cardiology.

## 2021-12-27 ENCOUNTER — Ambulatory Visit (INDEPENDENT_AMBULATORY_CARE_PROVIDER_SITE_OTHER): Payer: 59 | Admitting: Orthopaedic Surgery

## 2021-12-27 ENCOUNTER — Other Ambulatory Visit (HOSPITAL_BASED_OUTPATIENT_CLINIC_OR_DEPARTMENT_OTHER): Payer: Self-pay | Admitting: Orthopaedic Surgery

## 2021-12-27 ENCOUNTER — Other Ambulatory Visit: Payer: Self-pay

## 2021-12-27 ENCOUNTER — Ambulatory Visit (HOSPITAL_BASED_OUTPATIENT_CLINIC_OR_DEPARTMENT_OTHER)
Admission: RE | Admit: 2021-12-27 | Discharge: 2021-12-27 | Disposition: A | Discharge status: Home / Self Care | Payer: 59 | Source: Ambulatory Visit | Attending: Orthopaedic Surgery | Admitting: Orthopaedic Surgery

## 2021-12-27 DIAGNOSIS — G8929 Other chronic pain: Secondary | ICD-10-CM | POA: Diagnosis not present

## 2021-12-27 DIAGNOSIS — M25512 Pain in left shoulder: Secondary | ICD-10-CM | POA: Diagnosis not present

## 2021-12-27 DIAGNOSIS — M7581 Other shoulder lesions, right shoulder: Secondary | ICD-10-CM

## 2021-12-27 DIAGNOSIS — M25511 Pain in right shoulder: Secondary | ICD-10-CM | POA: Diagnosis not present

## 2021-12-27 DIAGNOSIS — M7582 Other shoulder lesions, left shoulder: Secondary | ICD-10-CM

## 2021-12-27 MED ORDER — LIDOCAINE HCL 1 % IJ SOLN
4.0000 mL | INTRAMUSCULAR | Status: AC | PRN
  Administered 2021-12-27: 4 mL

## 2021-12-27 MED ORDER — TRIAMCINOLONE ACETONIDE 40 MG/ML IJ SUSP
80.0000 mg | INTRAMUSCULAR | Status: AC | PRN
  Administered 2021-12-27: 80 mg via INTRA_ARTICULAR

## 2021-12-27 NOTE — Progress Notes (Signed)
? ?                            ? ? ?Chief Complaint: Bilateral shoulder pain ?  ? ? ?History of Present Illness:  ? ? ?Paula Thompson is a 51 y.o. female right-hand-dominant female presents with left worse than right shoulder pain has been going on for several months.  She denied any specific injury or accident.  She states that the pains come on insidiously.  She states that the pain is predominantly located with overhead activity.  She has a hard time in particular getting the left arm in a forward position greater than 30 degrees.  Once she moves it up herself she is able to hold it there.  She has not had any injections or physical therapy.  She is having a hard time with hygiene and getting dressed.  She has a difficult time laying directly on the side.  She is not currently working.  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 ? ? ?Surgical History:   ?None ? ?PMH/PSH/Family History/Social History/Meds/Allergies:   ? ?Past Medical History:  ?Diagnosis Date  ?? Anemia   ?? Blood transfusion without reported diagnosis   ?? Gastric ulcer with hemorrhage   ? ?Past Surgical History:  ?Procedure Laterality Date  ?? ESOPHAGOGASTRODUODENOSCOPY N/A 09/26/2014  ? Procedure: ESOPHAGOGASTRODUODENOSCOPY (EGD);  Surgeon: Theda Belfast, MD;  Location[facility] ENDOSCOPY;  Service: Endoscopy;  Laterality: N/A;  ?? RIGHT/LEFT HEART CATH AND CORONARY ANGIOGRAPHY N/A 07/25/2021  ? Procedure: RIGHT/LEFT HEART CATH AND CORONARY ANGIOGRAPHY;  Surgeon: Elder Negus, MD;  Location: [facility] INVASIVE CV LAB;  Service: Cardiovascular;  Laterality: N/A;  ? ?Social History  ? ?Socioeconomic History  ?? Marital status: Single  ?  Spouse name: Not on file  ?? Number of children: 2  ?? Years of education: Not on file  ?? Highest education level: Not on file  ?Occupational History  ?? Occupation: MAINTENANCE  ?Tobacco Use  ?? Smoking status: Some Days  ?  Packs/day: 0.25  ?  Types: Cigarettes  ?? Smokeless tobacco: Never  ?Vaping Use   ?? Vaping Use: Never used  ?Substance and Sexual Activity  ?? Alcohol use: Yes  ?  Comment: OCC  ?? Drug use: No  ?? Sexual activity: Not on file  ?Other Topics Concern  ?? Not on file  ?Social History Narrative  ?? Not on file  ? ?Social Determinants of Health  ? ?Financial Resource Strain: Not on file  ?Food Insecurity: Not on file  ?Transportation Needs: Not on file  ?Physical Activity: Not on file  ?Stress: Not on file  ?Social Connections: Not on file  ? ?Family History  ?Problem Relation Age of Onset  ?? Diabetes Father   ?? Hyperlipidemia Brother   ?? Heart disease Brother   ?? Heart disease Maternal Grandmother   ?? Cancer Maternal Grandfather   ?? Lung cancer Maternal Grandfather   ? ?Allergies  ?Allergen Reactions  ?? Amoxicillin Hives  ? ?Current Outpatient Medications  ?Medication Sig Dispense Refill  ?? atorvastatin (LIPITOR) 40 MG tablet Take 1 tablet (40 mg total) by mouth daily. 90 tablet 3  ?? dapagliflozin propanediol (FARXIGA) 10 MG TABS tablet Take 1 tablet (10 mg total) by mouth daily. 90 tablet 3  ?? diclofenac Sodium (VOLTAREN) 1 % GEL Apply 4 g topically 4 (four) times daily. 100 g 11  ?? furosemide (LASIX) 40  MG tablet TAKE 1 TABLET (40 MG TOTAL) BY MOUTH DAILY AS NEEDED FOR EDEMA OR FLUID (LEG SWELLING OR MORE THAN USUAL SHORTNESS OF BREATH). 90 tablet 0  ?? ivabradine (CORLANOR) 7.5 MG TABS tablet TAKE 1 TABLET (7.5 MG TOTAL) BY MOUTH 2 (TWO) TIMES DAILY WITH A MEAL. 180 tablet 3  ?? metoprolol succinate (TOPROL-XL) 25 MG 24 hr tablet TAKE 1 TABLET (25 MG TOTAL) BY MOUTH DAILY. 90 tablet 0  ?? sacubitril-valsartan (ENTRESTO) 49-51 MG Take 1 tablet by mouth 2 (two) times daily. 60 tablet 2  ?? spironolactone (ALDACTONE) 25 MG tablet TAKE 1 TABLET (25 MG TOTAL) BY MOUTH DAILY. 90 tablet 0  ? ?No current facility-administered medications for this visit.  ? ?No results found. ? ?Review of Systems:   ?A ROS was performed including pertinent positives and negatives as documented in the  HPI. ? ?Physical Exam :   ?Constitutional: NAD and appears stated age ?Neurological: Alert and oriented ?Psych: Appropriate affect and cooperative ?There were no vitals taken for this visit.  ? ?Comprehensive Musculoskeletal Exam:   ? ?Musculoskeletal Exam    ?Inspection Right Left  ?Skin No atrophy or winging No atrophy or winging  ?Palpation    ?Tenderness Greater tuberosity Greater tuberosity  ?Range of Motion    ?Flexion (passive) 170 170  ?Flexion (active) 170 170 1 assisted  ?Abduction 170 170  ?ER at the side 70 70  ?Can reach behind back to L1 L1  ?Strength    ? Full Full with pain  ?Special Tests    ?Pseudoparalytic No No  ?Neurologic    ?Fires PIN, radial, median, ulnar, musculocutaneous, axillary, suprascapular, long thoracic, and spinal accessory innervated muscles. No abnormal sensibility  ?Vascular/Lymphatic    ?Radial Pulse 2+ 2+  ?Cervical Exam    ?Patient has symmetric cervical range of motion with negative Spurling's test.  ?Special Test: Positive Neer impingement bilaterally  ? ? ? ?Imaging:   ?Xray (3 views right shoulder, 3 views left shoulder): ?Normal ? ? ?I personally reviewed and interpreted the radiographs. ? ? ?Assessment:   ?51 year old female with left worse than right likely subacromial bursitis and impingement.  At this time I would plan I would like to offer her bilateral subacromial injections in order to help allow her to begin a physical therapy program for strengthening of both shoulders.  We discussed that should she not have improvement after physical therapy we would consider potentially an MRI to rule out an underlying tear.  Plan for bilateral shoulder rotator cuff program ? ?Plan :   ? ?-Bilateral ultrasound-guided subacromial injections performed after verbal consent ?-Physical therapy ordered for bilateral shoulder rotator cuff strengthening ? ? ? ? ?Procedure Note ? ?Patient: Paula Thompson             ?Date of Birth: Oct 04, 1971           ?MRN: [id number]              ?Visit Date: 12/27/2021 ? ?Procedures: ?Visit Diagnoses:  ?1. Chronic pain of both shoulders   ?2. Tendinitis of left rotator cuff   ?3. Rotator cuff tendinitis, right   ? ? ?Large Joint Inj: R subacromial bursa on 12/27/2021 2:29 PM ?Indications: pain ?Details: 22 G 1.5 in needle, ultrasound-guided anterior approach ? ?Arthrogram: No ? ?Medications: 4 mL lidocaine 1 %; 80 mg triamcinolone acetonide 40 MG/ML ?Outcome: tolerated well, no immediate complications ?Procedure, treatment alternatives, risks and benefits explained, specific risks discussed. Consent was given by the  patient. Immediately prior to procedure a time out was called to verify the correct patient, procedure, equipment, support staff and site/side marked as required. Patient was prepped and draped in the usual sterile fashion.  ? ? ?Large Joint Inj: L subacromial bursa on 12/27/2021 2:29 PM ?Indications: pain ?Details: 22 G 1.5 in needle, ultrasound-guided anterior approach ? ?Arthrogram: No ? ?Medications: 4 mL lidocaine 1 %; 80 mg triamcinolone acetonide 40 MG/ML ?Outcome: tolerated well, no immediate complications ?Procedure, treatment alternatives, risks and benefits explained, specific risks discussed. Consent was given by the patient. Immediately prior to procedure a time out was called to verify the correct patient, procedure, equipment, support staff and site/side marked as required. Patient was prepped and draped in the usual sterile fashion.  ? ? ? ? ? ?I personally saw and evaluated the patient, and participated in the management and treatment plan. ? ?Huel Cote, MD ?Attending Physician, Orthopedic Surgery ? ?This document was dictated using Conservation officer, historic buildings. A reasonable attempt at proof reading has been made to minimize errors. ?

## 2022-01-04 NOTE — Therapy (Deleted)
?OUTPATIENT PHYSICAL THERAPY SHOULDER EVALUATION ? ? ?Patient Name: Paula Thompson ?MRN: [id number] ?DOB:12/26/1970, 51 y.o., female ?Today's Date: 01/04/2022 ? ? ? ?Past Medical History:  ?Diagnosis Date  ? Anemia   ? Blood transfusion without reported diagnosis   ? Gastric ulcer with hemorrhage   ? ?Past Surgical History:  ?Procedure Laterality Date  ? ESOPHAGOGASTRODUODENOSCOPY N/A 09/26/2014  ? Procedure: ESOPHAGOGASTRODUODENOSCOPY (EGD);  Surgeon: Theda Belfast, MD;  Location: [facility] ENDOSCOPY;  Service: Endoscopy;  Laterality: N/A;  ? RIGHT/LEFT HEART CATH AND CORONARY ANGIOGRAPHY N/A 07/25/2021  ? Procedure: RIGHT/LEFT HEART CATH AND CORONARY ANGIOGRAPHY;  Surgeon: Elder Negus, MD;  Location: [facility] INVASIVE CV LAB;  Service: Cardiovascular;  Laterality: N/A;  ? ?Patient Active Problem List  ? Diagnosis Date Noted  ? Chronic pain of both shoulders 12/21/2021  ? Vision changes 12/21/2021  ? Nonischemic cardiomyopathy (HCC) 08/02/2021  ? HFrEF (heart failure with reduced ejection fraction) (HCC) 07/24/2021  ? Elevated troponin   ? Primary hypertension   ? CHF (congestive heart failure) (HCC) 07/17/2021  ? Primary osteoarthritis of right knee 06/02/2020  ? Antral ulcer 10/01/2014  ? Tooth pain   ? Upper GI bleed 09/25/2014  ? Acute blood loss anemia 09/25/2014  ? ? ?PCP: Tollie Eth, NP ? ?REFERRING PROVIDER: Huel Cote, MD ? ?REFERRING DIAG: M25.511,G89.29,M25.512 (ICD-10-CM) - Chronic pain of both shoulders ? ?THERAPY DIAG:  ?No diagnosis found. ? ? ?ONSET DATE: *** ? ?SUBJECTIVE:                                                                                                                                                                                     ? ?SUBJECTIVE STATEMENT: ?*** ? ?Paula Thompson is a 51 y.o. female right-hand-dominant female presents with left worse than right shoulder pain has been going on for several months.  She denied any specific injury or accident.  She states that the  pains come on insidiously.  She states that the pain is predominantly located with overhead activity.  She has a hard time in particular getting the left arm in a forward position greater than 30 degrees.  Once she moves it up herself she is able to hold it there.  She has not had any injections or physical therapy.  She is having a hard time with hygiene and getting dressed.  She has a difficult time laying directly on the side.  She is not currently working.  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 ?PERTINENT HISTORY: ?Hx of heart cath 2022 ? ?PAIN:  ?Are you having pain? {yes/no:20286} ?NPRS scale: ***/10 ?Pain location: *** ?Pain orientation: {Pain  Orientation:25161}  ?PAIN TYPE: {type:313116} ?Pain description: {PAIN DESCRIPTION:21022940}  ?Aggravating factors: *** ?Relieving factors: *** ? ?PRECAUTIONS: {Therapy precautions:24002} ? ?WEIGHT BEARING RESTRICTIONS {Yes ***/No:24003} ? ?FALLS:  ?Has patient fallen in last 6 months? {yes/no:20286} Number of falls: *** ? ?LIVING ENVIRONMENT: ?Lives with: {[facility] lives with:25569::"lives with their family"} ?Lives in: {Lives in:25570} ?Stairs: {yes/no:20286}; {Stairs:24000} ?Has following equipment at home: {Assistive devices:23999} ? ?OCCUPATION: ?***Not currently working - used to work as Financial risk analyst ? ?PLOF: {PLOF:24004} ? ?PATIENT GOALS *** ? ?OBJECTIVE:  ? ?DIAGNOSTIC FINDINGS:  ?12/27/21 ?R shoulder xray ?IMPRESSION: ?Mild acromioclavicular degenerative arthritis ?  ?Prominent right cervical rib ? ?L shoulder xray  ?IMPRESSION: ?Negative. ?  ?PATIENT SURVEYS:  ?{rehab surveys:24030:a} ? ?COGNITION: ? Overall cognitive status: {cognition:24006} ?    ?SENSATION: ? Light touch: {intact/deficits:24005} ? Stereognosis: {intact/deficits:24005} ? Hot/Cold: {intact/deficits:24005} ? Proprioception: {intact/deficits:24005} ? ?POSTURE: ?*** ? ? Cervical  A/PROM:  ?  01/04/2022     ? Flexion      ? Extension      ? R ROT      ? L ROT      ? R SB      ?L SB    ?  *  Pain   ?(Blank rows = not tested) ? ? UE Measurements ?Upper Extremity Right ?01/04/2022 Left ?01/04/2022  ? A/PROM MMT A/PROM MMT  ?Shoulder Flexion      ?Shoulder Extension      ?Shoulder Abduction      ?Shoulder Adduction      ?Shoulder Internal Rotation      ?Shoulder External Rotation      ?Elbow Flexion      ?Elbow Extension      ?Wrist Flexion      ?Wrist Extension      ?Wrist Supination      ?Wrist Pronation      ?Wrist Ulnar Deviation      ?Wrist Radial Deviation      ?Grip Strength NA  NA   ?  (Blank rows = not tested) ?  * pain ? ? ?SHOULDER SPECIAL TESTS: ? Impingement tests: {shoulder impingement test:25231:a} ? SLAP lesions: {SLAP lesions:25232} ? Instability tests: {shoulder instability test:25233} ? Rotator cuff assessment: {rotator cuff assessment:25234} ? Biceps assessment: {biceps assessment:25235} ? ?JOINT MOBILITY TESTING:  ?*** ? ?PALPATION:  ?*** ?  ?TODAY'S TREATMENT:  ?01/04/2022 ? ?Therapeutic Exercise: ? Aerobic: ?Supine: ?Prone: ? Seated: ? Standing: ?Neuromuscular Re-education: ?Manual Therapy: ?Therapeutic Activity: ?Self Care: ?Trigger Point Dry Needling:  ?Modalities:  ? ? ? ?PATIENT EDUCATION:  ?Education details: on current presentation, on HEP, on clinical outcomes score and POC ?Person educated: Patient ?Education method: Explanation, Demonstration, and Handouts ?Education comprehension: verbalized understanding ? ? ? ?HOME EXERCISE PROGRAM: ?*** ? ?ASSESSMENT: ? ?CLINICAL IMPRESSION: ?Patient is a 51 y.o. female who was seen today for physical therapy evaluation and treatment for bilateral shoulder pain.  ? ? ?OBJECTIVE IMPAIRMENTS {opptimpairments:25111}.  ? ?ACTIVITY LIMITATIONS {activity limitations:25113}.  ? ?PERSONAL FACTORS {Personal factors:25162} are also affecting patient's functional outcome.  ? ? ?REHAB POTENTIAL: {rehabpotential:25112} ? ?CLINICAL DECISION MAKING: {clinical decision making:25114} ? ?EVALUATION COMPLEXITY: {Evaluation complexity:25115} ? ?GOALS: ?Goals  reviewed with patient?  yes ? ?SHORT TERM GOALS: ? ?Patient will be independent in self management strategies to improve quality of life and functional outcomes. ?Baseline: new program ?Target date: {follow up:25551} ?Goal status: INITIAL ? ?2.  Patient will report at least 50% improvement in overall symptoms and/or function to demonstrate improved functional mobility ?Baseline: 0% ?Target date: {follow  up:25551} ?Goal status: INITIAL ? ?3.  *** ?Baseline: *** ?Target date: {follow up:25551} ?Goal status: INITIAL ? ?4.  *** ?Baseline: *** ?Target date: {follow up:25551} ?Goal status: INITIAL ? ? ? ? ?LONG TERM GOALS: ? ?Patient will report at least 75% improvement in overall symptoms and/or function to demonstrate improved functional mobility ?Baseline: 0% ?Target date: {follow up:25551} ?Goal status: INITIAL ? ?2.  *** ?Baseline: *** ?Target date: {follow up:25551} ?Goal status: {INITIAL ? ?3.  *** ?Baseline: *** ?Target date: {follow up:25551} ?Goal status: INITIAL ? ?4.  *** ?Baseline: *** ?Target date: {follow up:25551} ?Goal status: INITIAL ? ? ? ?PLAN: ?PT FREQUENCY: {rehab frequency:25116} ? ?PT DURATION: {rehab duration:25117} ? ?PLANNED INTERVENTIONS: {rehab planned interventions:25118::"Therapeutic exercises","Therapeutic activity","Neuromuscular re-education","Balance training","Gait training","Patient/Family education","Joint mobilization"} ? ?PLAN FOR NEXT SESSION: *** ? ? ?2:14 PM, 01/04/22 ?Tereasa Coop, DPT ?Physical Therapy with [place] ?[facility]  ?[phone] office ? ?

## 2022-01-08 ENCOUNTER — Ambulatory Visit: Payer: 59 | Admitting: Physical Therapy

## 2022-01-09 ENCOUNTER — Other Ambulatory Visit: Payer: Self-pay

## 2022-01-09 ENCOUNTER — Encounter (HOSPITAL_BASED_OUTPATIENT_CLINIC_OR_DEPARTMENT_OTHER): Payer: Self-pay | Admitting: Nurse Practitioner

## 2022-01-09 ENCOUNTER — Other Ambulatory Visit (HOSPITAL_BASED_OUTPATIENT_CLINIC_OR_DEPARTMENT_OTHER): Payer: Self-pay

## 2022-01-09 ENCOUNTER — Ambulatory Visit (INDEPENDENT_AMBULATORY_CARE_PROVIDER_SITE_OTHER): Payer: 59 | Admitting: Nurse Practitioner

## 2022-01-09 VITALS — BP 117/72 | HR 99 | Ht 65.0 in | Wt 266.8 lb

## 2022-01-09 DIAGNOSIS — I10 Essential (primary) hypertension: Secondary | ICD-10-CM

## 2022-01-09 DIAGNOSIS — I509 Heart failure, unspecified: Secondary | ICD-10-CM

## 2022-01-09 DIAGNOSIS — K219 Gastro-esophageal reflux disease without esophagitis: Secondary | ICD-10-CM

## 2022-01-09 DIAGNOSIS — I5020 Unspecified systolic (congestive) heart failure: Secondary | ICD-10-CM

## 2022-01-09 DIAGNOSIS — I428 Other cardiomyopathies: Secondary | ICD-10-CM

## 2022-01-09 MED ORDER — IVABRADINE HCL 7.5 MG PO TABS
ORAL_TABLET | ORAL | 3 refills | Status: DC
  Filled 2022-01-09: qty 180, 90d supply, fill #0

## 2022-01-09 MED ORDER — PANTOPRAZOLE SODIUM 40 MG PO TBEC
DELAYED_RELEASE_TABLET | ORAL | 3 refills | Status: DC
Start: 2022-01-09 — End: 2022-07-04
  Filled 2022-01-09: qty 42, 28d supply, fill #0
  Filled 2022-02-14: qty 30, 30d supply, fill #1
  Filled 2022-03-28: qty 30, 30d supply, fill #2
  Filled 2022-04-23: qty 30, 30d supply, fill #3
  Filled 2022-06-06: qty 30, 30d supply, fill #4
  Filled 2022-07-04: qty 30, 30d supply, fill #5

## 2022-01-09 NOTE — Assessment & Plan Note (Signed)
History of antral ulcer with hemorrhage in 2015. ?Symptoms today consistent with onset of reflux symptoms patient experienced during that time. ?At this time no alarm signs are present.  All vital signs are stable.  No evidence of bleeding ?Recommend immediate start of pantoprazole 40 mg twice a day x2 weeks and then decrease to once a day.  Recommend continuation for a minimum of 3 months. ?Dietary recommendations provided for patient today to help reduce gastric acid secretion. ?Strict instructions to seek emergency care if symptoms of dizziness, palpitations, chest pain, bleeding, nausea, or severe abdominal pain are experienced. ?We will continue to monitor conservatively unless symptoms are escalated. ? ?

## 2022-01-09 NOTE — Patient Instructions (Addendum)
I have sent in protonix to the pharmacy downstairs. It should be about $9 cash price. I want you to take this for at least 3 months.  ?

## 2022-01-09 NOTE — Progress Notes (Signed)
? ?Acute Office Visit ? ?Subjective:  ? ? Patient ID: Paula Thompson, female    DOB: 1971/06/21, 51 y.o.   MRN: [id number] ? ?Chief Complaint  ?Patient presents with  ? Acute Visit  ?  Patient presents today with acid reflux. She thinks it is a GERD flare up. She was prescribed protonix and never filled it due to no insurance.   ? ? ?HPI ?Patient is in today for symptoms of recurrence of stomach ulcer.  ?Gastric Ulcer ?- history of bleeding ulcer with hemorrhage in the past.  ?- endorses that for the past several days she has experienced fullness in the esophagus with tenderness and feeling of globus while swallowing, epigastric tenderness, and increased belching.  ?- reports same symptoms occurred with gastric ulcer initially.  ?- was prescribed protonix, but insurance did not cover and was unable to pick up after hospitalization ?- has been monitoring diet, but no improvement ?- No bleeding, emesis, nausea, dark stools present at this time ?- no fevers, chills, BA, weakness, dizziness, ShOB, palpitations, sweating, CP.  ? ?Outpatient Medications Prior to Visit  ?Medication Sig Dispense Refill  ? atorvastatin (LIPITOR) 40 MG tablet Take 1 tablet (40 mg total) by mouth daily. 90 tablet 3  ? dapagliflozin propanediol (FARXIGA) 10 MG TABS tablet Take 1 tablet (10 mg total) by mouth daily. 90 tablet 3  ? diclofenac Sodium (VOLTAREN) 1 % GEL Apply 4 g topically 4 (four) times daily. 100 g 11  ? furosemide (LASIX) 40 MG tablet TAKE 1 TABLET (40 MG TOTAL) BY MOUTH DAILY AS NEEDED FOR EDEMA OR FLUID (LEG SWELLING OR MORE THAN USUAL SHORTNESS OF BREATH). 90 tablet 0  ? metoprolol succinate (TOPROL-XL) 25 MG 24 hr tablet TAKE 1 TABLET (25 MG TOTAL) BY MOUTH DAILY. 90 tablet 0  ? sacubitril-valsartan (ENTRESTO) 49-51 MG Take 1 tablet by mouth 2 (two) times daily. 60 tablet 2  ? spironolactone (ALDACTONE) 25 MG tablet TAKE 1 TABLET (25 MG TOTAL) BY MOUTH DAILY. 90 tablet 0  ? ivabradine (CORLANOR) 7.5 MG TABS tablet TAKE 1  TABLET (7.5 MG TOTAL) BY MOUTH 2 (TWO) TIMES DAILY WITH A MEAL. 180 tablet 3  ? ?No facility-administered medications prior to visit.  ? ? ?Allergies  ?Allergen Reactions  ? Amoxicillin Hives  ? ? ?Review of Systems ?All review of systems negative except what is listed in the HPI ? ?   ?Objective:  ?  ?Physical Exam ?Vitals and nursing note reviewed.  ?Constitutional:   ?   Appearance: Normal appearance. She is obese.  ?HENT:  ?   Head: Normocephalic.  ?   Mouth/Throat:  ?   Mouth: Mucous membranes are moist.  ?   Pharynx: Oropharynx is clear.  ?Eyes:  ?   Extraocular Movements: Extraocular movements intact.  ?   Conjunctiva/sclera: Conjunctivae normal.  ?   Pupils: Pupils are equal, round, and reactive to light.  ?Cardiovascular:  ?   Rate and Rhythm: Normal rate and regular rhythm.  ?   Pulses: Normal pulses.  ?   Heart sounds: Normal heart sounds. No murmur heard. ?Pulmonary:  ?   Effort: Pulmonary effort is normal.  ?   Breath sounds: Normal breath sounds. No wheezing.  ?Abdominal:  ?   General: Bowel sounds are normal. There is no distension.  ?   Palpations: Abdomen is soft. There is no mass.  ?   Tenderness: There is no abdominal tenderness. There is no guarding or rebound.  ?Musculoskeletal:  ?  Cervical back: Normal range of motion.  ?   Right lower leg: No edema.  ?   Left lower leg: No edema.  ?Lymphadenopathy:  ?   Cervical: No cervical adenopathy.  ?Skin: ?   General: Skin is warm and dry.  ?   Capillary Refill: Capillary refill takes less than 2 seconds.  ?Neurological:  ?   General: No focal deficit present.  ?   Mental Status: She is alert and oriented to person, place, and time.  ?Psychiatric:     ?   Mood and Affect: Mood normal.     ?   Behavior: Behavior normal.     ?   Thought Content: Thought content normal.     ?   Judgment: Judgment normal.  ? ? ?BP 117/72   Pulse 99   Ht 5\' 5"  (1.651 m)   Wt 266 lb 12.8 oz (121 kg)   SpO2 99%   BMI 44.40 kg/m?  ?Wt Readings from Last 3 Encounters:   ?01/09/22 266 lb 12.8 oz (121 kg)  ?12/20/21 275 lb 14.4 oz (125.1 kg)  ?11/16/21 271 lb (122.9 kg)  ? ? ?   ?Assessment & Plan:  ? ?Problem List Items Addressed This Visit   ? ? Gastroesophageal reflux disease - Primary  ?  History of antral ulcer with hemorrhage in 2015. ?Symptoms today consistent with onset of reflux symptoms patient experienced during that time. ?At this time no alarm signs are present.  All vital signs are stable.  No evidence of bleeding ?Recommend immediate start of pantoprazole 40 mg twice a day x2 weeks and then decrease to once a day.  Recommend continuation for a minimum of 3 months. ?Dietary recommendations provided for patient today to help reduce gastric acid secretion. ?Strict instructions to seek emergency care if symptoms of dizziness, palpitations, chest pain, bleeding, nausea, or severe abdominal pain are experienced. ?We will continue to monitor conservatively unless symptoms are escalated. ? ?  ?  ? Relevant Medications  ? pantoprazole (PROTONIX) 40 MG tablet  ? CHF (congestive heart failure) (HCC)  ? Relevant Medications  ? ivabradine (CORLANOR) 7.5 MG TABS tablet  ? Primary hypertension  ? Relevant Medications  ? ivabradine (CORLANOR) 7.5 MG TABS tablet  ? HFrEF (heart failure with reduced ejection fraction) (HCC)  ? Relevant Medications  ? ivabradine (CORLANOR) 7.5 MG TABS tablet  ? Nonischemic cardiomyopathy (HCC)  ? Relevant Medications  ? ivabradine (CORLANOR) 7.5 MG TABS tablet  ? ? ? ?Meds ordered this encounter  ?Medications  ? pantoprazole (PROTONIX) 40 MG tablet  ?  Sig: Take 40mg  twice a day for two weeks then cut down to 40mg  once a day.  ?  Dispense:  45 tablet  ?  Refill:  3  ? ivabradine (CORLANOR) 7.5 MG TABS tablet  ?  Sig: TAKE 1 TABLET (7.5 MG TOTAL) BY MOUTH 2 (TWO) TIMES DAILY WITH A MEAL.  ?  Dispense:  180 tablet  ?  Refill:  3  ? ?Return if symptoms worsen or fail to improve. ? ? ?2016, NP ? ?

## 2022-01-10 ENCOUNTER — Other Ambulatory Visit (HOSPITAL_BASED_OUTPATIENT_CLINIC_OR_DEPARTMENT_OTHER): Payer: Self-pay

## 2022-01-11 ENCOUNTER — Other Ambulatory Visit (HOSPITAL_BASED_OUTPATIENT_CLINIC_OR_DEPARTMENT_OTHER): Payer: Self-pay

## 2022-01-12 ENCOUNTER — Other Ambulatory Visit (HOSPITAL_BASED_OUTPATIENT_CLINIC_OR_DEPARTMENT_OTHER): Payer: Self-pay

## 2022-01-15 ENCOUNTER — Other Ambulatory Visit (HOSPITAL_BASED_OUTPATIENT_CLINIC_OR_DEPARTMENT_OTHER): Payer: Self-pay

## 2022-01-15 ENCOUNTER — Ambulatory Visit (HOSPITAL_BASED_OUTPATIENT_CLINIC_OR_DEPARTMENT_OTHER): Payer: 59 | Admitting: Nurse Practitioner

## 2022-01-17 ENCOUNTER — Other Ambulatory Visit (HOSPITAL_BASED_OUTPATIENT_CLINIC_OR_DEPARTMENT_OTHER): Payer: Self-pay

## 2022-01-18 ENCOUNTER — Other Ambulatory Visit (HOSPITAL_BASED_OUTPATIENT_CLINIC_OR_DEPARTMENT_OTHER): Payer: Self-pay

## 2022-01-18 ENCOUNTER — Other Ambulatory Visit: Payer: Self-pay | Admitting: Nurse Practitioner

## 2022-01-18 MED ORDER — ATORVASTATIN CALCIUM 40 MG PO TABS
40.0000 mg | ORAL_TABLET | Freq: Every day | ORAL | 3 refills | Status: DC
  Filled 2022-01-18: qty 90, 90d supply, fill #0

## 2022-01-24 ENCOUNTER — Ambulatory Visit (HOSPITAL_BASED_OUTPATIENT_CLINIC_OR_DEPARTMENT_OTHER): Payer: 59 | Admitting: Orthopaedic Surgery

## 2022-01-24 ENCOUNTER — Ambulatory Visit (INDEPENDENT_AMBULATORY_CARE_PROVIDER_SITE_OTHER): Payer: 59 | Admitting: Orthopaedic Surgery

## 2022-01-24 ENCOUNTER — Other Ambulatory Visit: Payer: Self-pay

## 2022-01-24 DIAGNOSIS — M7582 Other shoulder lesions, left shoulder: Secondary | ICD-10-CM | POA: Diagnosis not present

## 2022-01-24 DIAGNOSIS — M25511 Pain in right shoulder: Secondary | ICD-10-CM | POA: Diagnosis not present

## 2022-01-24 DIAGNOSIS — M25512 Pain in left shoulder: Secondary | ICD-10-CM | POA: Diagnosis not present

## 2022-01-24 MED ORDER — LIDOCAINE HCL 1 % IJ SOLN
4.0000 mL | INTRAMUSCULAR | Status: AC | PRN
  Administered 2022-01-24: 4 mL

## 2022-01-24 MED ORDER — TRIAMCINOLONE ACETONIDE 40 MG/ML IJ SUSP
80.0000 mg | INTRAMUSCULAR | Status: AC | PRN
  Administered 2022-01-24: 80 mg via INTRA_ARTICULAR

## 2022-01-24 NOTE — Progress Notes (Signed)
? ?                            ? ? ?Chief Complaint: Bilateral shoulder pain ?  ? ? ?History of Present Illness:  ? ?01/24/2022: Presents for follow-up of her bilateral shoulders.  She states that she got near complete relief of her right shoulder pain following the injection although her left shoulder continues to be painful.  Unfortunately she has not been able to keep working with physical therapy.  She continues to have pain and limited range of motion about the left shoulder ? ?Paula Thompson is a 51 y.o. female right-hand-dominant female presents with left worse than right shoulder pain has been going on for several months.  She denied any specific injury or accident.  She states that the pains come on insidiously.  She states that the pain is predominantly located with overhead activity.  She has a hard time in particular getting the left arm in a forward position greater than 30 degrees.  Once she moves it up herself she is able to hold it there.  She has not had any injections or physical therapy.  She is having a hard time with hygiene and getting dressed.  She has a difficult time laying directly on the side.  She is not currently working.  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 ? ? ?Surgical History:   ?None ? ?PMH/PSH/Family History/Social History/Meds/Allergies:   ? ?Past Medical History:  ?Diagnosis Date  ? Anemia   ? Blood transfusion without reported diagnosis   ? Gastric ulcer with hemorrhage   ? ?Past Surgical History:  ?Procedure Laterality Date  ? ESOPHAGOGASTRODUODENOSCOPY N/A 09/26/2014  ? Procedure: ESOPHAGOGASTRODUODENOSCOPY (EGD);  Surgeon: Theda Belfast, MD;  Location: [facility] ENDOSCOPY;  Service: Endoscopy;  Laterality: N/A;  ? RIGHT/LEFT HEART CATH AND CORONARY ANGIOGRAPHY N/A 07/25/2021  ? Procedure: RIGHT/LEFT HEART CATH AND CORONARY ANGIOGRAPHY;  Surgeon: Elder Negus, MD;  Location: [facility] INVASIVE CV LAB;  Service: Cardiovascular;  Laterality: N/A;  ? ?Social History   ? ?Socioeconomic History  ? Marital status: Single  ?  Spouse name: Not on file  ? Number of children: 2  ? Years of education: Not on file  ? Highest education level: Not on file  ?Occupational History  ? Occupation: MAINTENANCE  ?Tobacco Use  ? Smoking status: Some Days  ?  Packs/day: 0.25  ?  Types: Cigarettes  ? Smokeless tobacco: Never  ?Vaping Use  ? Vaping Use: Never used  ?Substance and Sexual Activity  ? Alcohol use: Yes  ?  Comment: OCC  ? Drug use: No  ? Sexual activity: Not on file  ?Other Topics Concern  ? Not on file  ?Social History Narrative  ? Not on file  ? ?Social Determinants of Health  ? ?Financial Resource Strain: Not on file  ?Food Insecurity: Not on file  ?Transportation Needs: Not on file  ?Physical Activity: Not on file  ?Stress: Not on file  ?Social Connections: Not on file  ? ?Family History  ?Problem Relation Age of Onset  ? Diabetes Father   ? Hyperlipidemia Brother   ? Heart disease Brother   ? Heart disease Maternal Grandmother   ? Cancer Maternal Grandfather   ? Lung cancer Maternal Grandfather   ? ?Allergies  ?Allergen Reactions  ? Amoxicillin Hives  ? ?Current Outpatient Medications  ?Medication Sig Dispense Refill  ? atorvastatin (  LIPITOR) 40 MG tablet Take 1 tablet (40 mg total) by mouth daily. 90 tablet 3  ? dapagliflozin propanediol (FARXIGA) 10 MG TABS tablet Take 1 tablet (10 mg total) by mouth daily. 90 tablet 3  ? diclofenac Sodium (VOLTAREN) 1 % GEL Apply 4 g topically 4 (four) times daily. 100 g 11  ? furosemide (LASIX) 40 MG tablet TAKE 1 TABLET (40 MG TOTAL) BY MOUTH DAILY AS NEEDED FOR EDEMA OR FLUID (LEG SWELLING OR MORE THAN USUAL SHORTNESS OF BREATH). 90 tablet 0  ? ivabradine (CORLANOR) 7.5 MG TABS tablet TAKE 1 TABLET (7.5 MG TOTAL) BY MOUTH 2 (TWO) TIMES DAILY WITH A MEAL. 180 tablet 3  ? metoprolol succinate (TOPROL-XL) 25 MG 24 hr tablet TAKE 1 TABLET (25 MG TOTAL) BY MOUTH DAILY. 90 tablet 0  ? pantoprazole (PROTONIX) 40 MG tablet Take 40mg  twice a day for  two weeks then cut down to 40mg  once a day. 45 tablet 3  ? sacubitril-valsartan (ENTRESTO) 49-51 MG Take 1 tablet by mouth 2 (two) times daily. 60 tablet 2  ? spironolactone (ALDACTONE) 25 MG tablet TAKE 1 TABLET (25 MG TOTAL) BY MOUTH DAILY. 90 tablet 0  ? ?No current facility-administered medications for this visit.  ? ?No results found. ? ?Review of Systems:   ?A ROS was performed including pertinent positives and negatives as documented in the HPI. ? ?Physical Exam :   ?Constitutional: NAD and appears stated age ?Neurological: Alert and oriented ?Psych: Appropriate affect and cooperative ?There were no vitals taken for this visit.  ? ?Comprehensive Musculoskeletal Exam:   ? ?Musculoskeletal Exam    ?Inspection Right Left  ?Skin No atrophy or winging No atrophy or winging  ?Palpation    ?Tenderness None Greater tuberosity  ?Range of Motion    ?Flexion (passive) 170 170  ?Flexion (active) 170 170 while assisted  ?Abduction 170 170  ?ER at the side 70 45  ?Can reach behind back to L1 Back pocket  ?Strength    ? Full Weakness with forward elevation and external rotation at the side  ?Special Tests    ?Pseudoparalytic No No  ?Neurologic    ?Fires PIN, radial, median, ulnar, musculocutaneous, axillary, suprascapular, long thoracic, and spinal accessory innervated muscles. No abnormal sensibility  ?Vascular/Lymphatic    ?Radial Pulse 2+ 2+  ?Cervical Exam    ?Patient has symmetric cervical range of motion with negative Spurling's test.  ?Special Test: Positive Neer impingement bilaterally  ? ? ? ?Imaging:   ?Xray (3 views right shoulder, 3 views left shoulder): ?Normal ? ? ?I personally reviewed and interpreted the radiographs. ? ? ?Assessment:   ?51 year old female with left worse than right likely subacromial bursitis and impingement.  At today's visit she did get significant relief from previous right injection and is seeking additional left shoulder injection.  Additional subacromial injection was performed today  under ultrasound guidance after verbal consent was obtained.  Ultimately I do believe that some physical therapy would be a good idea for her to strengthen up his left shoulder.  If she is not having any relief in 4 to 6 weeks I will plan for MRI of the left shoulder ? ?Plan :   ? ?-Physical therapy ordered for predominantly left shoulder strengthening ? ? ? ? ?Procedure Note ? ?Patient: Paula Thompson             ?Date of Birth: [birth year]           ?MRN: [id number]             ?  Visit Date: 01/24/2022 ? ?Procedures: ?Visit Diagnoses:  ?No diagnosis found. ? ? ?Large Joint Inj: L subacromial bursa on 01/24/2022 12:03 PM ?Indications: pain ?Details: 22 G 1.5 in needle, ultrasound-guided anterior approach ? ?Arthrogram: No ? ?Medications: 4 mL lidocaine 1 %; 80 mg triamcinolone acetonide 40 MG/ML ?Outcome: tolerated well, no immediate complications ?Procedure, treatment alternatives, risks and benefits explained, specific risks discussed. Consent was given by the patient. Immediately prior to procedure a time out was called to verify the correct patient, procedure, equipment, support staff and site/side marked as required. Patient was prepped and draped in the usual sterile fashion.  ? ? ? ? ? ?I personally saw and evaluated the patient, and participated in the management and treatment plan. ? ?Huel Cote, MD ?Attending Physician, Orthopedic Surgery ? ?This document was dictated using Conservation officer, historic buildings. A reasonable attempt at proof reading has been made to minimize errors. ?

## 2022-01-29 ENCOUNTER — Ambulatory Visit: Payer: Medicaid Other | Admitting: Nurse Practitioner

## 2022-02-09 ENCOUNTER — Ambulatory Visit (INDEPENDENT_AMBULATORY_CARE_PROVIDER_SITE_OTHER): Payer: 59 | Admitting: Physical Therapy

## 2022-02-09 ENCOUNTER — Encounter: Payer: Self-pay | Admitting: Physical Therapy

## 2022-02-09 DIAGNOSIS — M25511 Pain in right shoulder: Secondary | ICD-10-CM

## 2022-02-09 DIAGNOSIS — M25512 Pain in left shoulder: Secondary | ICD-10-CM | POA: Diagnosis not present

## 2022-02-09 DIAGNOSIS — M6281 Muscle weakness (generalized): Secondary | ICD-10-CM

## 2022-02-09 NOTE — Therapy (Signed)
?OUTPATIENT PHYSICAL THERAPY SHOULDER EVALUATION ? ? ?Patient Name: Paula Thompson ?MRN: [id number] ?DOB:1971-07-04, 51 y.o., female ?Today's Date: 02/09/2022 ? ? PT End of Session - 02/12/22 0810   ? ? Visit Number 1   ? Number of Visits 12   ? Date for PT Re-Evaluation 03/23/22   ? Authorization Type [employer]   ? PT Start Time 1145   ? PT Stop Time 1225   ? PT Time Calculation (min) 40 min   ? Activity Tolerance Patient tolerated treatment well   ? ?  ?  ? ?  ? ? ?Past Medical History:  ?Diagnosis Date  ? Anemia   ? Blood transfusion without reported diagnosis   ? Gastric ulcer with hemorrhage   ? ?Past Surgical History:  ?Procedure Laterality Date  ? ESOPHAGOGASTRODUODENOSCOPY N/A 09/26/2014  ? Procedure: ESOPHAGOGASTRODUODENOSCOPY (EGD);  Surgeon: Theda Belfast, MD;  Location: [facility] ENDOSCOPY;  Service: Endoscopy;  Laterality: N/A;  ? RIGHT/LEFT HEART CATH AND CORONARY ANGIOGRAPHY N/A 07/25/2021  ? Procedure: RIGHT/LEFT HEART CATH AND CORONARY ANGIOGRAPHY;  Surgeon: Elder Negus, MD;  Location: [facility] INVASIVE CV LAB;  Service: Cardiovascular;  Laterality: N/A;  ? ?Patient Active Problem List  ? Diagnosis Date Noted  ? Gastroesophageal reflux disease 01/09/2022  ? Chronic pain of both shoulders 12/21/2021  ? Vision changes 12/21/2021  ? Nonischemic cardiomyopathy (HCC) 08/02/2021  ? HFrEF (heart failure with reduced ejection fraction) (HCC) 07/24/2021  ? Elevated troponin   ? Primary hypertension   ? CHF (congestive heart failure) (HCC) 07/17/2021  ? Primary osteoarthritis of right knee 06/02/2020  ? Antral ulcer 10/01/2014  ? Tooth pain   ? Upper GI bleed 09/25/2014  ? Acute blood loss anemia 09/25/2014  ? ? ?PCP: Tollie Eth, NP ? ?REFERRING PROVIDER: Huel Cote, MD ? ?REFERRING DIAG: Bil shoulder pain ? ?THERAPY DIAG:  ?Acute pain of left shoulder ? ?Acute pain of right shoulder ? ?Muscle weakness (generalized) ? ? ?ONSET DATE:  ? ?SUBJECTIVE:                                                                                                                                                                                      ? 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 She is R handed. Has pain bil, but L >R. Did have previous injection on R, and recent injection on L. L still very painful. Increased pain with reaching, lifting, carrying. She is not working at this time. Does feel weakness in L >R hand.  ? ?PERTINENT HISTORY: ?Recent CHF diagnosis, (  SOB at times).  ? ?PAIN:  ?Are you having pain? Yes: NPRS scale: 8/10 ?Pain location: LEFT shoulder  ?Pain description: Sore ?Aggravating factors: lifting, reaching up, out, carrying. ADLs and IADLs.  ?Relieving factors: rest, non use  ? ?Are you having pain? Yes: NPRS scale: 6/10 ?Pain location: RIGHT shoulder  ?Pain description: sore ?Aggravating factors: lifting, reaching up, out, carrying. ADLs and IADLs.  ?Relieving factors: rest, non use  ? ? ? ?PRECAUTIONS: None ? ?WEIGHT BEARING RESTRICTIONS No ? ?FALLS:  ?Has patient fallen in last 6 months? No ? ?OCCUPATION: Not working at this time  ? ? ?PLOF: Independent ? ?PATIENT GOALS : Decreased pain, improved use of shoulders  ? ?OBJECTIVE:  ? ?DIAGNOSTIC FINDINGS:  ? ? ?COGNITION: ? Overall cognitive status: Within functional limits for tasks assessed ?    ?SENSATION: ?WFL ? ?POSTURE: ?Rounded shoulders  ? ?UPPER EXTREMITY ROM:  ? ?Passive/ Active ROM Right ?02/09/2022 Left ?02/09/2022  ?Shoulder flexion 140/105 130/60  ?Shoulder extension    ?Shoulder abduction 140/65 130/40  ?Shoulder adduction    ?Shoulder internal rotation wnl wnl  ?Shoulder external rotation wnl wnl  ?Elbow flexion    ?Elbow extension    ?Wrist flexion    ?Wrist extension    ?Wrist ulnar deviation    ?Wrist radial deviation    ?Wrist pronation    ?Wrist supination    ?(Blank rows = not tested) ? ?UPPER EXTREMITY MMT: ? ?MMT  Right ?02/09/2022 Left ?02/09/2022  ?Shoulder flexion 3- 3-  ?Shoulder extension    ?Shoulder abduction 3- 3-  ?Shoulder adduction    ?Shoulder internal rotation 4 4  ?Shoulder external rotation 4 4  ?    ?    ?    ?    ?    ?    ?    ?    ?    ?    ?Grip strength (lbs) 40 30  ?(Blank rows = not tested) ? ?SHOULDER SPECIAL TESTS: ?Difficult to test, due to increased pain at 90 deg of elevation.  ? ?JOINT MOBILITY TESTING:  ?Bil GHJ mobility: WFL, limited ROM due to pain.  ? ?PALPATION:  ? ?  ?TODAY'S TREATMENT:  ?02/09/22 ?Therapeutic Exercise: ?Aerobic: ?Supine: ?S/L:   ?Seated:  Scap squeeze x 10;  Bil ER x 10 ?Standing:  Shoulder flexion AAROM/Cane x 10; Chest press x 5;  ?Stretches:  ?Neuromuscular Re-education: ?Manual Therapy: ?Self Care: ? ? ? ?PATIENT EDUCATION: ?Education details: PT POC, Exam findings, HEP ?Person educated: Patient ?Education method: Explanation, Demonstration, Tactile cues, Verbal cues, and Handouts ?Education comprehension: verbalized understanding, returned demonstration, verbal cues required, tactile cues required, and needs further education ? ? ?HOME EXERCISE PROGRAM: ?Access Code: [email or web address] ?URL: [email or web address] ?Date: 02/09/2022 ?Prepared by: Sedalia Muta ? ?Exercises ?- Supine Shoulder Flexion Extension AAROM with Dowel  - 1-2 x daily - 1 sets - 10 reps - 5 hold ?- Seated Scapular Retraction  - 2 x daily - 1 sets - 10 reps ?- Seated Shoulder External Rotation with Dumbbells  - 1 x daily - 1-2 sets - 10 reps ? ?ASSESSMENT: ? ?CLINICAL IMPRESSION: ?Pt presents with primary complaint of increased pain in bilateral shoulders. She has significant pain and limitation for elevation bilaterally. She has good PROM, with only mild pain, but much decreased ability for AROM. She has had ongoing pain and deficit, and has significant limitation for functional activity, ADLS, IADLS, reaching, lifting, due to pain. Pt to benefit from skilled PT to improve deficits  and pain.   ? ? ?OBJECTIVE IMPAIRMENTS decreased activity tolerance, decreased endurance, decreased knowledge of use of DME, decreased mobility, decreased ROM, decreased strength, increased muscle spasms, impaired flexibility, impaired UE functional use, and pain.  ? ?ACTIVITY LIMITATIONS cleaning, community activity, driving, meal prep, laundry, and shopping.  ? ?PERSONAL FACTORS Time since onset of injury/illness/exacerbation are also affecting patient's functional outcome.  ? ? ?REHAB POTENTIAL: Good ? ?CLINICAL DECISION MAKING: Stable/uncomplicated ? ?EVALUATION COMPLEXITY: Low ? ? ?GOALS: ?Goals reviewed with patient? Yes ? ?SHORT TERM GOALS: Target date: 02/23/22 ? ? ?Pt to be independent with initial HEP ? ?Goal status: INITIAL ? ?2.  Pt to report decreased pain in bil shoulders, to 0-4/10 with activity  ?  ?Goal status: INITIAL ? ?3.  Pt to demo improved AROM by at least 10 deg for shoulder elevation bilaterally ? ?Goal status: INITIAL ? ? ? ? ? ?LONG TERM GOALS: Target date: 03/23/2022 ? ?Pt to be independent with final HEP ? ?Goal status: INITIAL ? ?2.  Pt to demo improved AROM for elevation, in bil shoulders, to improve ability for reach, lift, carry, and IADLS.  ? ?Goal status: INITIAL ? ?3.  Pt to demo improved strength of bil shoulders to at least 4/5 to improve ability for ADLs and IADLS.  ? ?Goal status: INITIAL ? ?4.  Pt to report decreased pain to 0-2/10 with shoulder activity.  ? ?Goal status: INITIAL ? ? ? ? ?PLAN: ?PT FREQUENCY: 2x/week ? ?PT DURATION: 6 weeks ? ?PLANNED INTERVENTIONS: Therapeutic exercises, Therapeutic activity, Neuromuscular re-education, Patient/Family education, Joint manipulation, Joint mobilization, DME instructions, Dry Needling, Electrical stimulation, Spinal mobilization, Cryotherapy, Moist heat, Taping, Traction, Ultrasound, Ionotophoresis 4mg /ml Dexamethasone, and Manual therapy ? ?PLAN FOR NEXT SESSION:  ? ? , PT, DPT ?8:23 AM  02/12/22 ? ? ?

## 2022-02-12 ENCOUNTER — Encounter: Payer: Self-pay | Admitting: Physical Therapy

## 2022-02-13 ENCOUNTER — Encounter: Payer: 59 | Admitting: Physical Therapy

## 2022-02-15 ENCOUNTER — Encounter: Payer: 59 | Admitting: Physical Therapy

## 2022-02-15 ENCOUNTER — Other Ambulatory Visit (HOSPITAL_BASED_OUTPATIENT_CLINIC_OR_DEPARTMENT_OTHER): Payer: Self-pay

## 2022-02-20 ENCOUNTER — Encounter: Payer: 59 | Admitting: Physical Therapy

## 2022-02-22 ENCOUNTER — Encounter: Payer: 59 | Admitting: Physical Therapy

## 2022-02-27 ENCOUNTER — Encounter: Payer: 59 | Admitting: Physical Therapy

## 2022-03-01 ENCOUNTER — Encounter: Payer: 59 | Admitting: Physical Therapy

## 2022-03-07 ENCOUNTER — Ambulatory Visit (HOSPITAL_BASED_OUTPATIENT_CLINIC_OR_DEPARTMENT_OTHER): Payer: 59 | Admitting: Orthopaedic Surgery

## 2022-03-12 ENCOUNTER — Other Ambulatory Visit: Payer: Self-pay | Admitting: Student

## 2022-03-28 ENCOUNTER — Other Ambulatory Visit (HOSPITAL_BASED_OUTPATIENT_CLINIC_OR_DEPARTMENT_OTHER): Payer: Self-pay

## 2022-04-02 ENCOUNTER — Encounter (HOSPITAL_BASED_OUTPATIENT_CLINIC_OR_DEPARTMENT_OTHER): Payer: Self-pay | Admitting: Orthopaedic Surgery

## 2022-04-17 ENCOUNTER — Other Ambulatory Visit (HOSPITAL_BASED_OUTPATIENT_CLINIC_OR_DEPARTMENT_OTHER): Payer: Self-pay

## 2022-04-23 ENCOUNTER — Other Ambulatory Visit (HOSPITAL_BASED_OUTPATIENT_CLINIC_OR_DEPARTMENT_OTHER): Payer: Self-pay

## 2022-04-25 ENCOUNTER — Ambulatory Visit (INDEPENDENT_AMBULATORY_CARE_PROVIDER_SITE_OTHER): Payer: 59 | Admitting: Nurse Practitioner

## 2022-04-25 ENCOUNTER — Other Ambulatory Visit (HOSPITAL_BASED_OUTPATIENT_CLINIC_OR_DEPARTMENT_OTHER): Payer: Self-pay

## 2022-04-25 ENCOUNTER — Encounter (HOSPITAL_BASED_OUTPATIENT_CLINIC_OR_DEPARTMENT_OTHER): Payer: Self-pay | Admitting: Nurse Practitioner

## 2022-04-25 VITALS — BP 117/75 | HR 86 | Ht 65.0 in | Wt 261.0 lb

## 2022-04-25 DIAGNOSIS — B3731 Acute candidiasis of vulva and vagina: Secondary | ICD-10-CM | POA: Diagnosis not present

## 2022-04-25 HISTORY — DX: Acute candidiasis of vulva and vagina: B37.31

## 2022-04-25 MED ORDER — NYSTATIN-TRIAMCINOLONE 100000-0.1 UNIT/GM-% EX OINT
1.0000 | TOPICAL_OINTMENT | Freq: Two times a day (BID) | CUTANEOUS | 2 refills | Status: DC
  Filled 2022-04-25: qty 30, 7d supply, fill #0

## 2022-04-25 MED ORDER — FLUCONAZOLE 150 MG PO TABS
ORAL_TABLET | ORAL | 4 refills | Status: DC
  Filled 2022-04-25: qty 2, 3d supply, fill #0
  Filled 2022-07-04: qty 2, 3d supply, fill #1
  Filled 2022-07-31: qty 2, 3d supply, fill #2
  Filled 2022-10-01: qty 2, 3d supply, fill #3
  Filled 2023-01-08: qty 2, 3d supply, fill #4

## 2022-04-25 NOTE — Patient Instructions (Signed)
Let me know if this doesn't get any better.

## 2022-04-25 NOTE — Assessment & Plan Note (Signed)
Thick white discharge with external vaginal pruritis for the past few weeks. No alarming symptoms present at this time. No STI risk. Will treat for vaginal candida infection with oral and topical treatment. Patient is at increased risk for candidal infections due to elevated blood sugars and medication use. Recommend tight glucose control and increased water intake.  F/U if sx worsen or fail to improve.

## 2022-05-16 ENCOUNTER — Other Ambulatory Visit (HOSPITAL_BASED_OUTPATIENT_CLINIC_OR_DEPARTMENT_OTHER): Payer: Self-pay

## 2022-05-16 ENCOUNTER — Encounter: Payer: Self-pay | Admitting: Student

## 2022-05-16 ENCOUNTER — Ambulatory Visit: Payer: 59 | Admitting: Student

## 2022-05-16 VITALS — BP 115/79 | HR 118 | Temp 97.8°F | Resp 17 | Ht 65.0 in | Wt 264.0 lb

## 2022-05-16 DIAGNOSIS — I10 Essential (primary) hypertension: Secondary | ICD-10-CM

## 2022-05-16 DIAGNOSIS — I428 Other cardiomyopathies: Secondary | ICD-10-CM

## 2022-05-16 DIAGNOSIS — I509 Heart failure, unspecified: Secondary | ICD-10-CM

## 2022-05-16 DIAGNOSIS — E7800 Pure hypercholesterolemia, unspecified: Secondary | ICD-10-CM

## 2022-05-16 DIAGNOSIS — I5020 Unspecified systolic (congestive) heart failure: Secondary | ICD-10-CM

## 2022-05-16 MED ORDER — ATORVASTATIN CALCIUM 40 MG PO TABS
40.0000 mg | ORAL_TABLET | Freq: Every day | ORAL | 3 refills | Status: DC
  Filled 2022-05-16: qty 90, 90d supply, fill #0
  Filled 2022-11-02 – 2022-11-28 (×3): qty 30, 30d supply, fill #1
  Filled 2023-01-04: qty 30, 30d supply, fill #2
  Filled 2023-03-05: qty 30, 30d supply, fill #3
  Filled 2023-04-01: qty 30, 30d supply, fill #4
  Filled 2023-05-08: qty 30, 30d supply, fill #5

## 2022-05-16 MED ORDER — IVABRADINE HCL 7.5 MG PO TABS
ORAL_TABLET | ORAL | 3 refills | Status: DC
  Filled 2022-05-16: qty 180, 90d supply, fill #0
  Filled 2022-06-11 – 2022-07-31 (×2): qty 60, 30d supply, fill #0

## 2022-05-16 MED ORDER — SPIRONOLACTONE 25 MG PO TABS
12.5000 mg | ORAL_TABLET | Freq: Every day | ORAL | 3 refills | Status: DC
  Filled 2022-05-16: qty 45, 90d supply, fill #0
  Filled 2022-07-31: qty 15, 30d supply, fill #0
  Filled 2022-10-01: qty 15, 30d supply, fill #1
  Filled 2022-11-12 – 2022-11-28 (×2): qty 15, 30d supply, fill #2

## 2022-05-16 MED ORDER — DAPAGLIFLOZIN PROPANEDIOL 10 MG PO TABS
10.0000 mg | ORAL_TABLET | Freq: Every day | ORAL | 3 refills | Status: DC
  Filled 2022-05-16: qty 90, 90d supply, fill #0
  Filled 2022-07-31: qty 30, 30d supply, fill #0
  Filled 2022-08-28 – 2022-09-12 (×2): qty 30, 30d supply, fill #1
  Filled 2022-11-02 (×2): qty 30, 30d supply, fill #2

## 2022-05-16 MED ORDER — ENTRESTO 49-51 MG PO TABS
1.0000 | ORAL_TABLET | Freq: Two times a day (BID) | ORAL | 3 refills | Status: DC
  Filled 2022-05-16: qty 180, 90d supply, fill #0
  Filled 2022-08-28 – 2022-09-12 (×2): qty 60, 30d supply, fill #1
  Filled 2022-12-19 – 2022-12-20 (×2): qty 60, 30d supply, fill #2
  Filled 2023-02-01 – 2023-02-13 (×2): qty 60, 30d supply, fill #3
  Filled 2023-04-01: qty 60, 30d supply, fill #4
  Filled 2023-05-08: qty 60, 30d supply, fill #5

## 2022-05-16 MED ORDER — METOPROLOL SUCCINATE ER 50 MG PO TB24
50.0000 mg | ORAL_TABLET | Freq: Every day | ORAL | 3 refills | Status: DC
  Filled 2022-05-16: qty 90, 90d supply, fill #0
  Filled 2022-10-01: qty 30, 30d supply, fill #1
  Filled 2022-11-02 – 2022-11-28 (×3): qty 30, 30d supply, fill #2

## 2022-05-16 NOTE — Progress Notes (Signed)
  Tollie Eth, DNP, AGNP-c [facility] [address]  [address] [place], [place] [address] [phone]  Subjective:   Paula Thompson is a 51 y.o. female presents to day for external vaginal itching.  The patient presents with symptoms of thick white vaginal discharge, itching on the labia and vaginal entrance, and a history of these symptoms for a few weeks. The reported absence of new sexual partners and any risk or concern for sexually transmitted infections (STIs) suggests that an STI is unlikely. There are no reports of pelvic pain, urinary symptoms, fevers, or irregular bleeding. She does have a known diagnosis of diabetes and is taking Farxiga (dapagliflozin). The patient's sugar levels are reported to be poorly controlled, and she is not consuming an adequate amount of water per her report.  PMH, Medications, and Allergies reviewed and updated in chart.   ROS negative except for what is listed in HPI. Objective:  BP 117/75   Pulse 86   Ht 5\' 5"  (1.651 m)   Wt 261 lb (118.4 kg)   SpO2 96%   BMI 43.43 kg/m  Physical Exam Vitals and nursing note reviewed.  Constitutional:      Appearance: Normal appearance.  Abdominal:     General: Bowel sounds are normal. There is no distension.     Palpations: Abdomen is soft. There is no mass.     Tenderness: There is no abdominal tenderness. There is no right CVA tenderness, left CVA tenderness, guarding or rebound.  Neurological:     General: No focal deficit present.     Mental Status: She is alert and oriented to person, place, and time.           Assessment & Plan:   Problem List Items Addressed This Visit     Vaginal candida - Primary    Thick white discharge with external vaginal pruritis for the past few weeks. No alarming symptoms present at this time. No STI risk. Will treat for vaginal candida infection with oral and topical treatment. Patient is at increased risk for candidal  infections due to elevated blood sugars and medication use. Recommend tight glucose control and increased water intake.  F/U if sx worsen or fail to improve.       Relevant Medications   fluconazole (DIFLUCAN) 150 MG tablet   nystatin-triamcinolone ointment (MYCOLOG)     , DNP, AGNP-c 05/16/2022  6:42 PM

## 2022-05-16 NOTE — Progress Notes (Signed)
Subjective:   Paula Thompson, female    DOB: July 13, 1971, 51 y.o.   MRN: [id number]   Chief Complaint  Patient presents with   HFrEF   Hypertension   HLD    6 MONTH     HPI  51 y.o. African-American female  with hypertension, nonischemic dilated cardiomyopathy.   Patient presents for 45-monthfollow-up.  At last office visit patient had been without Corlanor for an extended period of time, therefore refilled this.  Unfortunately repeat lipid profile testing has not been done as previously ordered.  Patient reports she ran out of Entresto approximately 1 to 2 weeks ago and over the last 1 week has noticed mild dyspnea on exertion and mild swelling.  Denies orthopnea, PND.  Current Outpatient Medications on File Prior to Visit  Medication Sig Dispense Refill   diclofenac Sodium (VOLTAREN) 1 % GEL Apply 4 g topically 4 (four) times daily. 100 g 11   fluconazole (DIFLUCAN) 150 MG tablet Take one tablet by mouth at the first sign of symptoms of yeast. If no resolution, repeat dose in 72 hours. 2 tablet 4   furosemide (LASIX) 40 MG tablet TAKE 1 TABLET (40 MG TOTAL) BY MOUTH DAILY AS NEEDED FOR EDEMA OR FLUID (LEG SWELLING OR MORE THAN USUAL SHORTNESS OF BREATH). 90 tablet 0   nystatin-triamcinolone ointment (MYCOLOG) Apply 1 Application topically 2 (two) times daily. Use very thin layer to area of itching. 30 g 2   pantoprazole (PROTONIX) 40 MG tablet Take 443mtwice a day for two weeks then cut down to 406mnce a day. 45 tablet 3   [DISCONTINUED] bismuth-metronidazole-tetracycline (PYLERA) 140-125-125 MG per capsule Take 3 capsules by mouth 4 (four) times daily -  before meals and at bedtime. 120 capsule 0   No current facility-administered medications on file prior to visit.    Cardiovascular and other pertinent studies: EKG 05/16/2022:  Sinus tachycardia at a rate of 109 bpm.  LVH with likely secondary ST-T wave changes.  Unchanged compared EKG 08/02/2021.  PCV ECHOCARDIOGRAM  COMPLETE 11/02/2021 Mildly depressed LV systolic function with visual EF 40-45%. Left ventricle cavity is dilated. Mild left ventricular hypertrophy. Hypokinetic global wall motion. Doppler evidence of grade I (impaired) diastolic dysfunction, normal LAP. No significant valvular heart disease. Compared to study 07/18/2021 LVEF has improved from <20% to 40-45% otherwise no significant change.   EKG 08/02/2021: Sinus rhythm 86 bpm  Left ventricular hypertrophy ST-T changes related to LVH  LHC/RHC 07/25/2021: Tortuous vessels with no significant CAD   RA: 9 mmHg RV: 31/7 mmHg PA: 30/20 mmHg, mPAP 25 mmHg PCW: 14 mmHg   CO: 6.3 L/min CI: 2.8 L/min/m2   Mildly decompensated nonischemic cardiomyopathy  Echocardiogram 07/18/2021:  1. Left ventricular ejection fraction, by estimation, is <20%. The left  ventricle has severely decreased function. The left ventricle demonstrates  global hypokinesis. The left ventricular internal cavity size was severely  dilated. Left ventricular  diastolic function could not be evaluated.   2. Right ventricular systolic function is normal. The right ventricular  size is normal. Tricuspid regurgitation signal is inadequate for assessing  PA pressure.   3. A small pericardial effusion is present. The pericardial effusion is  circumferential. There is no evidence of cardiac tamponade.   4. The mitral valve is normal in structure. No evidence of mitral valve  regurgitation. No evidence of mitral stenosis.   5. The aortic valve is normal in structure. Aortic valve regurgitation is  not visualized. No  aortic stenosis is present.   6. The inferior vena cava is dilated in size with >50% respiratory  variability, suggesting right atrial pressure of 8 mmHg.    Recent labs:    Latest Ref Rng & Units 08/07/2021   10:49 AM 07/25/2021    9:33 AM 07/25/2021    9:29 AM  CMP  Glucose 70 - 99 mg/dL 135     BUN 6 - 24 mg/dL 18     Creatinine 0.57 - 1.00 mg/dL 0.78      Sodium 134 - 144 mmol/L 140  143  142    142   Potassium 3.5 - 5.2 mmol/L 4.3  4.2  4.4    4.3   Chloride 96 - 106 mmol/L 103     CO2 20 - 29 mmol/L 20     Calcium 8.7 - 10.2 mg/dL 9.8     Total Protein 6.0 - 8.5 g/dL 7.5     Total Bilirubin 0.0 - 1.2 mg/dL 0.5     Alkaline Phos 44 - 121 IU/L 82     AST 0 - 40 IU/L 19     ALT 0 - 32 IU/L 23         Latest Ref Rng & Units 07/25/2021    9:33 AM 07/25/2021    9:29 AM 07/18/2021    5:01 AM  CBC  WBC 4.0 - 10.5 K/uL   9.3   Hemoglobin 12.0 - 15.0 g/dL 15.3  15.6    15.6  13.2   Hematocrit 36.0 - 46.0 % 45.0  46.0    46.0  42.6   Platelets 150 - 400 K/uL   186    Lipid Panel     Component Value Date/Time   CHOL 208 (H) 08/07/2021 1049   TRIG 159 (H) 08/07/2021 1049   HDL 40 08/07/2021 1049   CHOLHDL 5.2 (H) 08/07/2021 1049   LDLCALC 139 (H) 08/07/2021 1049   HEMOGLOBIN A1C Lab Results  Component Value Date   HGBA1C 6.2 (H) 07/17/2021   MPG 131.24 07/17/2021   TSH Recent Labs    07/18/21 1300  TSH 1.999   Other Labs: 07/19/2021: Glucose 159, BUN/Cr 17/0.71. EGFR >60. Na/K 139/3.8. Rest of the CMP normal H/H 13/42. MCV 74. Platelets 186 HbA1C 6.2% TSH 1.9 normal    Review of Systems  Constitutional: Negative for malaise/fatigue and weight gain.  Cardiovascular:  Positive for dyspnea on exertion (mild) and leg swelling (mild). Negative for chest pain, claudication, near-syncope, orthopnea, palpitations, paroxysmal nocturnal dyspnea and syncope.  Respiratory:  Negative for shortness of breath.   Neurological:  Negative for dizziness.         Vitals:   05/16/22 1347  BP: 115/79  Pulse: (!) 118  Resp: 17  Temp: 97.8 F (36.6 C)  SpO2: 97%     Body mass index is 43.93 kg/m. Filed Weights   05/16/22 1347  Weight: 264 lb (119.7 kg)     Objective:   Physical Exam Vitals and nursing note reviewed.  Constitutional:      General: She is not in acute distress.    Appearance: She is obese.  Neck:      Vascular: No carotid bruit or JVD.  Cardiovascular:     Rate and Rhythm: Regular rhythm. Tachycardia present.     Pulses: Intact distal pulses.     Heart sounds: Normal heart sounds, S1 normal and S2 normal. No murmur heard.    No gallop.  Pulmonary:  Effort: Pulmonary effort is normal. No respiratory distress.     Breath sounds: Normal breath sounds. No wheezing, rhonchi or rales.  Musculoskeletal:     Right lower leg: Edema (minimal) present.     Left lower leg: Edema (minimal) present.  Neurological:     Mental Status: She is alert.        Assessment & Recommendations:    51 y.o. African-American female  with hypertension, nonischemic dilated cardiomyopathy  HFrEF: Nonischemic dilated cardiomyopathy.   Echo 10/2021 revealed LVEF improved from <20% to 40-45%. Suspect present dyspnea mild leg edema is related to the fact the patient has run out of Entresto.  Advised patient to take Lasix 40 mg daily for the next 3 days.  We will also resume Entresto. Repeat BMP and proBNP in 1 week Continue atorvastatin, Farxiga, Corlanor, Entresto Patient's heart rate is >70 bpm however blood pressure is soft.  We will therefore reduce spironolactone from 25 mg to 12.5 mg p.o. daily and increase Toprol-XL from 25 mg to 50 mg daily.  Hyperlipidemia: Given uncontrolled lipids and 10-year ASCVD risk estimated at 6.4% Patient continues to tolerate statin therapy, continue atorvastatin We will obtain repeat lipid profile testing.   Follow-up in 3 months, sooner if needed.   Alethia Berthold, PA-C 05/16/2022, 3:42 PM Office: [phone]

## 2022-05-17 ENCOUNTER — Other Ambulatory Visit (HOSPITAL_BASED_OUTPATIENT_CLINIC_OR_DEPARTMENT_OTHER): Payer: Self-pay

## 2022-05-18 ENCOUNTER — Ambulatory Visit (INDEPENDENT_AMBULATORY_CARE_PROVIDER_SITE_OTHER): Payer: 59

## 2022-05-18 ENCOUNTER — Ambulatory Visit (HOSPITAL_BASED_OUTPATIENT_CLINIC_OR_DEPARTMENT_OTHER): Payer: 59 | Admitting: Orthopaedic Surgery

## 2022-05-18 DIAGNOSIS — M25561 Pain in right knee: Secondary | ICD-10-CM | POA: Diagnosis not present

## 2022-05-18 DIAGNOSIS — G8929 Other chronic pain: Secondary | ICD-10-CM

## 2022-05-18 DIAGNOSIS — M7551 Bursitis of right shoulder: Secondary | ICD-10-CM | POA: Diagnosis not present

## 2022-05-18 MED ORDER — LIDOCAINE HCL 1 % IJ SOLN
4.0000 mL | INTRAMUSCULAR | Status: AC | PRN
  Administered 2022-05-18: 4 mL

## 2022-05-18 MED ORDER — TRIAMCINOLONE ACETONIDE 40 MG/ML IJ SUSP
80.0000 mg | INTRAMUSCULAR | Status: AC | PRN
  Administered 2022-05-18: 80 mg via INTRA_ARTICULAR

## 2022-05-18 NOTE — Progress Notes (Signed)
Chief Complaint: Bilateral shoulder pain     History of Present Illness:   05/18/2022: Presents today for follow-up of her right shoulder as well as right knee.  She states that overall she did feel much better after right shoulder injection but this is subsequently worn off.  She is experiencing pain and overhead weakness with the right shoulder.  With regard to the right knee she has had pain for the last several years.  She did have a fall several years prior which worsened this.  She has not previously had any injections.  She feels like the knee does give out and gets swollen significantly.  KHAYLA KOPPENHAVER is a 51 y.o. female right-hand-dominant female presents with left worse than right shoulder pain has been going on for several months.  She denied any specific injury or accident.  She states that the pains come on insidiously.  She states that the pain is predominantly located with overhead activity.  She has a hard time in particular getting the left arm in a forward position greater than 30 degrees.  Once she moves it up herself she is able to hold it there.  She has not had any injections or physical therapy.  She is having a hard time with hygiene and getting dressed.  She has a difficult time laying directly on the side.  She is not currently working.  But she has previously worked in the past as a Surveyor, mining History:   None  PMH/PSH/Family History/Social History/Meds/Allergies:    Past Medical History:  Diagnosis Date  . Anemia   . Blood transfusion without reported diagnosis   . Gastric ulcer with hemorrhage    Past Surgical History:  Procedure Laterality Date  . ESOPHAGOGASTRODUODENOSCOPY N/A 09/26/2014   Procedure: ESOPHAGOGASTRODUODENOSCOPY (EGD);  Surgeon: Theda Belfast, MD;  Location: [facility] ENDOSCOPY;  Service: Endoscopy;  Laterality: N/A;  . RIGHT/LEFT HEART CATH AND CORONARY ANGIOGRAPHY N/A 07/25/2021   Procedure: RIGHT/LEFT  HEART CATH AND CORONARY ANGIOGRAPHY;  Surgeon: Elder Negus, MD;  Location: [facility] INVASIVE CV LAB;  Service: Cardiovascular;  Laterality: N/A;   Social History   Socioeconomic History  . Marital status: Single    Spouse name: Not on file  . Number of children: 2  . Years of education: Not on file  . Highest education level: Not on file  Occupational History  . Occupation: MAINTENANCE  Tobacco Use  . Smoking status: Some Days    Packs/day: 0.25    Types: Cigarettes  . Smokeless tobacco: Never  Vaping Use  . Vaping Use: Never used  Substance and Sexual Activity  . Alcohol use: Yes    Comment: OCC  . Drug use: No  . Sexual activity: Not on file  Other Topics Concern  . Not on file  Social History Narrative  . Not on file   Social Determinants of Health   Financial Resource Strain: Not on file  Food Insecurity: Not on file  Transportation Needs: Not on file  Physical Activity: Not on file  Stress: Not on file  Social Connections: Not on file   Family History  Problem Relation Age of Onset  . Hypertension Mother   . Diabetes Father   . Hypertension Sister   . Hypertension Brother   . Hyperlipidemia Brother   .  Heart disease Brother   . Heart disease Maternal Grandmother   . Cancer Maternal Grandfather   . Lung cancer Maternal Grandfather    Allergies  Allergen Reactions  . Amoxicillin Hives   Current Outpatient Medications  Medication Sig Dispense Refill  . atorvastatin (LIPITOR) 40 MG tablet Take 1 tablet (40 mg total) by mouth daily. 90 tablet 3  . dapagliflozin propanediol (FARXIGA) 10 MG TABS tablet Take 1 tablet (10 mg total) by mouth daily. 90 tablet 3  . diclofenac Sodium (VOLTAREN) 1 % GEL Apply 4 g topically 4 (four) times daily. 100 g 11  . fluconazole (DIFLUCAN) 150 MG tablet Take one tablet by mouth at the first sign of symptoms of yeast. If no resolution, repeat dose in 72 hours. 2 tablet 4  . furosemide (LASIX) 40 MG tablet TAKE 1 TABLET (40  MG TOTAL) BY MOUTH DAILY AS NEEDED FOR EDEMA OR FLUID (LEG SWELLING OR MORE THAN USUAL SHORTNESS OF BREATH). 90 tablet 0  . ivabradine (CORLANOR) 7.5 MG TABS tablet TAKE 1 TABLET (7.5 MG TOTAL) BY MOUTH 2 (TWO) TIMES DAILY WITH A MEAL. 180 tablet 3  . metoprolol succinate (TOPROL-XL) 50 MG 24 hr tablet Take 1 tablet (50 mg total) by mouth daily. 90 tablet 3  . nystatin-triamcinolone ointment (MYCOLOG) Apply 1 Application topically 2 (two) times daily. Use very thin layer to area of itching. 30 g 2  . pantoprazole (PROTONIX) 40 MG tablet Take 40mg  twice a day for two weeks then cut down to 40mg  once a day. 45 tablet 3  . sacubitril-valsartan (ENTRESTO) 49-51 MG Take 1 tablet by mouth 2 (two) times daily. 180 tablet 3  . spironolactone (ALDACTONE) 25 MG tablet Take 0.5 tablets (12.5 mg total) by mouth daily. 45 tablet 3   No current facility-administered medications for this visit.   No results found.  Review of Systems:   A ROS was performed including pertinent positives and negatives as documented in the HPI.  Physical Exam :   Constitutional: NAD and appears stated age Neurological: Alert and oriented Psych: Appropriate affect and cooperative There were no vitals taken for this visit.   Comprehensive Musculoskeletal Exam:    Musculoskeletal Exam    Inspection Right Left  Skin No atrophy or winging No atrophy or winging  Palpation    Tenderness None Greater tuberosity  Range of Motion    Flexion (passive) 170 170  Flexion (active) 170 170 while assisted  Abduction 170 170  ER at the side 70 45  Can reach behind back to L1 Back pocket  Strength     Full Weakness with forward elevation and external rotation at the side  Special Tests    Pseudoparalytic No No  Neurologic    Fires PIN, radial, median, ulnar, musculocutaneous, axillary, suprascapular, long thoracic, and spinal accessory innervated muscles. No abnormal sensibility  Vascular/Lymphatic    Radial Pulse 2+ 2+   Cervical Exam    Patient has symmetric cervical range of motion with negative Spurling's test.  Special Test: Positive Neer impingement bilaterally    Right knee with tenderness to palpation over the lateral joint space predominantly.  No laxity with varus or valgus negative Lachman positive crepitus trace effusion   Imaging:   Xray (3 views right shoulder, 3 views left shoulder, right knee 4 views): Normal  Significant arthritis involving the lateral tibiofemoral joint space   I personally reviewed and interpreted the radiographs.   Assessment:   51 year old female with left worse than right  likely subacromial bursitis and impingement.  Given the fact that she did experience excellent relief from her first injection she would like to proceed with an additional right subacromial injection.  I did discuss that if this is not having effect we would potentially consider getting an MRI in the future.  With regard to her right knee she has a significant lateral joint space osteoarthritis and side effect I recommended an ultrasound-guided injection for this as well.  She would like to proceed with this.  I will plan to see her back on an as-needed basis  Plan :    -Right subacromial as well as knee ultrasound-guided injections performed today after verbal consent obtained     Procedure Note  Patient: KARENNA ROMANOFF             Date of Birth: 03-07-1971           MRN: [id number]             Visit Date: 05/18/2022  Procedures: Visit Diagnoses:  1. Chronic pain of right knee      Large Joint Inj: R subacromial bursa on 05/18/2022 11:40 AM Indications: pain Details: 22 G 1.5 in needle, ultrasound-guided anterior approach  Arthrogram: No  Medications: 4 mL lidocaine 1 %; 80 mg triamcinolone acetonide 40 MG/ML Outcome: tolerated well, no immediate complications Procedure, treatment alternatives, risks and benefits explained, specific risks discussed. Consent was given by the  patient. Immediately prior to procedure a time out was called to verify the correct patient, procedure, equipment, support staff and site/side marked as required. Patient was prepped and draped in the usual sterile fashion.    Large Joint Inj: R knee on 05/18/2022 11:41 AM Indications: pain Details: 22 G 1.5 in needle, ultrasound-guided anterior approach  Arthrogram: No  Medications: 4 mL lidocaine 1 %; 80 mg triamcinolone acetonide 40 MG/ML Outcome: tolerated well, no immediate complications Procedure, treatment alternatives, risks and benefits explained, specific risks discussed. Consent was given by the patient. Immediately prior to procedure a time out was called to verify the correct patient, procedure, equipment, support staff and site/side marked as required. Patient was prepped and draped in the usual sterile fashion.       I personally saw and evaluated the patient, and participated in the management and treatment plan.  Huel Cote, MD Attending Physician, Orthopedic Surgery  This document was dictated using Dragon voice recognition software. A reasonable attempt at proof reading has been made to minimize errors.

## 2022-05-30 ENCOUNTER — Other Ambulatory Visit (HOSPITAL_BASED_OUTPATIENT_CLINIC_OR_DEPARTMENT_OTHER): Payer: Self-pay

## 2022-06-01 ENCOUNTER — Other Ambulatory Visit (HOSPITAL_BASED_OUTPATIENT_CLINIC_OR_DEPARTMENT_OTHER): Payer: Self-pay

## 2022-06-06 ENCOUNTER — Other Ambulatory Visit (HOSPITAL_BASED_OUTPATIENT_CLINIC_OR_DEPARTMENT_OTHER): Payer: Self-pay

## 2022-06-11 ENCOUNTER — Other Ambulatory Visit (HOSPITAL_BASED_OUTPATIENT_CLINIC_OR_DEPARTMENT_OTHER): Payer: Self-pay

## 2022-06-11 ENCOUNTER — Ambulatory Visit (HOSPITAL_BASED_OUTPATIENT_CLINIC_OR_DEPARTMENT_OTHER): Payer: 59

## 2022-06-13 LAB — LIPID PANEL WITH LDL/HDL RATIO
Cholesterol, Total: 164 mg/dL (ref 100–199)
HDL: 49 mg/dL (ref >39)
LDL Chol Calc (NIH): 86 mg/dL (ref 0–99)
LDL/HDL Ratio: 1.8 ratio (ref 0.0–3.2)
Triglycerides: 172 mg/dL — ABNORMAL HIGH (ref 0–149)
VLDL Cholesterol Cal: 29 mg/dL (ref 5–40)

## 2022-06-13 NOTE — Progress Notes (Signed)
Your patient 

## 2022-06-14 ENCOUNTER — Other Ambulatory Visit (HOSPITAL_BASED_OUTPATIENT_CLINIC_OR_DEPARTMENT_OTHER): Payer: Self-pay

## 2022-06-15 ENCOUNTER — Other Ambulatory Visit (HOSPITAL_BASED_OUTPATIENT_CLINIC_OR_DEPARTMENT_OTHER): Payer: Self-pay

## 2022-06-19 ENCOUNTER — Encounter (HOSPITAL_BASED_OUTPATIENT_CLINIC_OR_DEPARTMENT_OTHER): Payer: 59 | Admitting: Nurse Practitioner

## 2022-06-20 ENCOUNTER — Other Ambulatory Visit (HOSPITAL_BASED_OUTPATIENT_CLINIC_OR_DEPARTMENT_OTHER): Payer: Self-pay

## 2022-06-26 ENCOUNTER — Other Ambulatory Visit (HOSPITAL_BASED_OUTPATIENT_CLINIC_OR_DEPARTMENT_OTHER): Payer: Self-pay

## 2022-06-29 ENCOUNTER — Other Ambulatory Visit (HOSPITAL_BASED_OUTPATIENT_CLINIC_OR_DEPARTMENT_OTHER): Payer: Self-pay

## 2022-07-04 ENCOUNTER — Other Ambulatory Visit (HOSPITAL_BASED_OUTPATIENT_CLINIC_OR_DEPARTMENT_OTHER): Payer: Self-pay

## 2022-07-04 ENCOUNTER — Other Ambulatory Visit (HOSPITAL_BASED_OUTPATIENT_CLINIC_OR_DEPARTMENT_OTHER): Payer: Self-pay | Admitting: Nurse Practitioner

## 2022-07-04 DIAGNOSIS — K219 Gastro-esophageal reflux disease without esophagitis: Secondary | ICD-10-CM

## 2022-07-04 MED ORDER — PANTOPRAZOLE SODIUM 40 MG PO TBEC
40.0000 mg | DELAYED_RELEASE_TABLET | Freq: Every day | ORAL | 3 refills | Status: DC
  Filled 2022-07-04: qty 30, 30d supply, fill #0
  Filled 2022-07-31: qty 30, 30d supply, fill #1
  Filled 2022-08-28 – 2022-10-08 (×2): qty 30, 30d supply, fill #2
  Filled 2022-11-12 – 2022-11-28 (×2): qty 30, 30d supply, fill #3
  Filled 2023-01-04: qty 30, 30d supply, fill #4
  Filled 2023-01-11 – 2023-02-13 (×3): qty 30, 30d supply, fill #5
  Filled 2023-03-27: qty 30, 30d supply, fill #6
  Filled 2023-05-08: qty 30, 30d supply, fill #7

## 2022-07-05 ENCOUNTER — Encounter (HOSPITAL_BASED_OUTPATIENT_CLINIC_OR_DEPARTMENT_OTHER): Payer: 59 | Admitting: Nurse Practitioner

## 2022-07-20 IMAGING — CR DG CHEST 2V
2 series · 2 of 2 positions shown · non-contrast
Comparison: 12/18/2006

CLINICAL DATA: Shortness of breath for 1 week with cough

EXAM:
CHEST - 2 VIEW

[w chest pa]
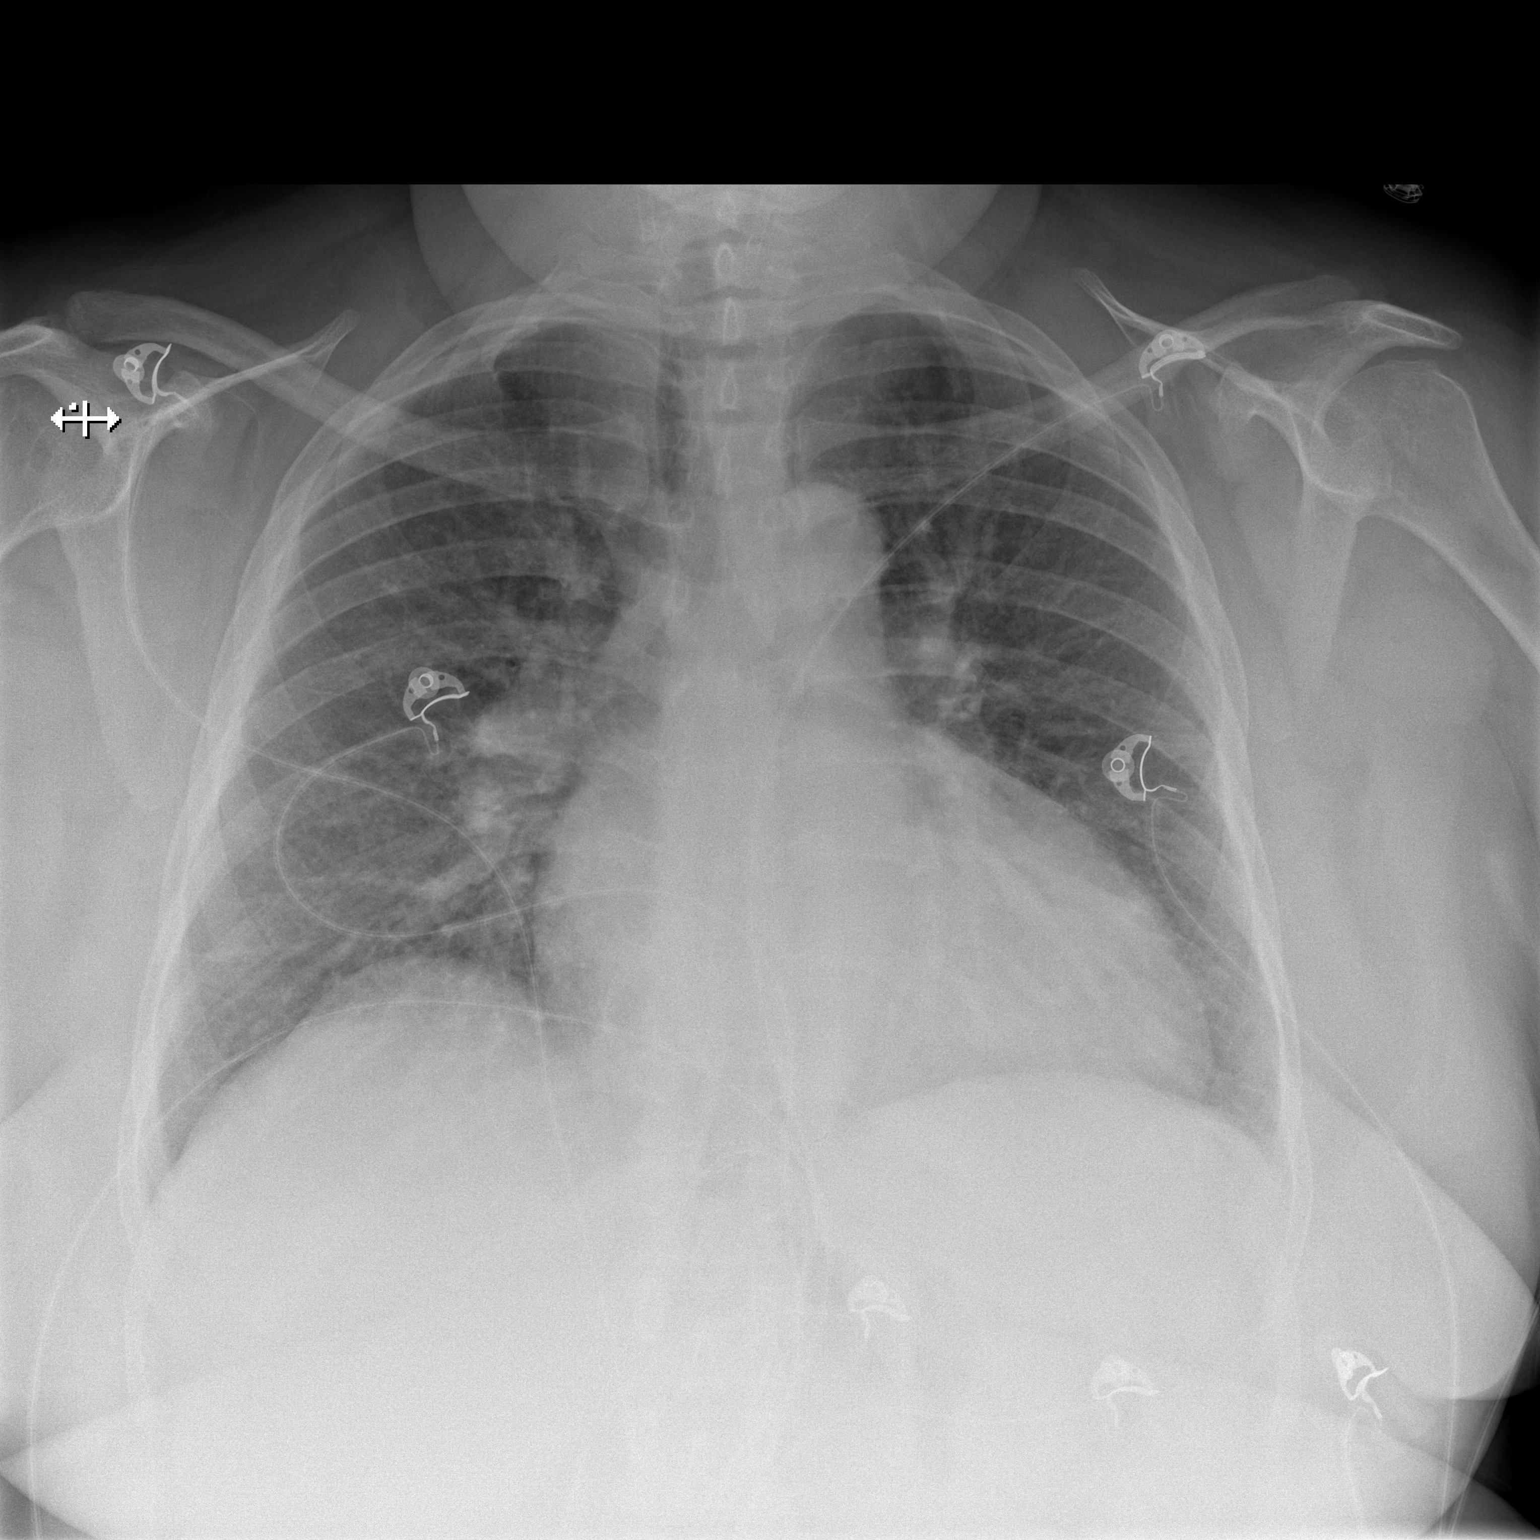

[w chest lat]
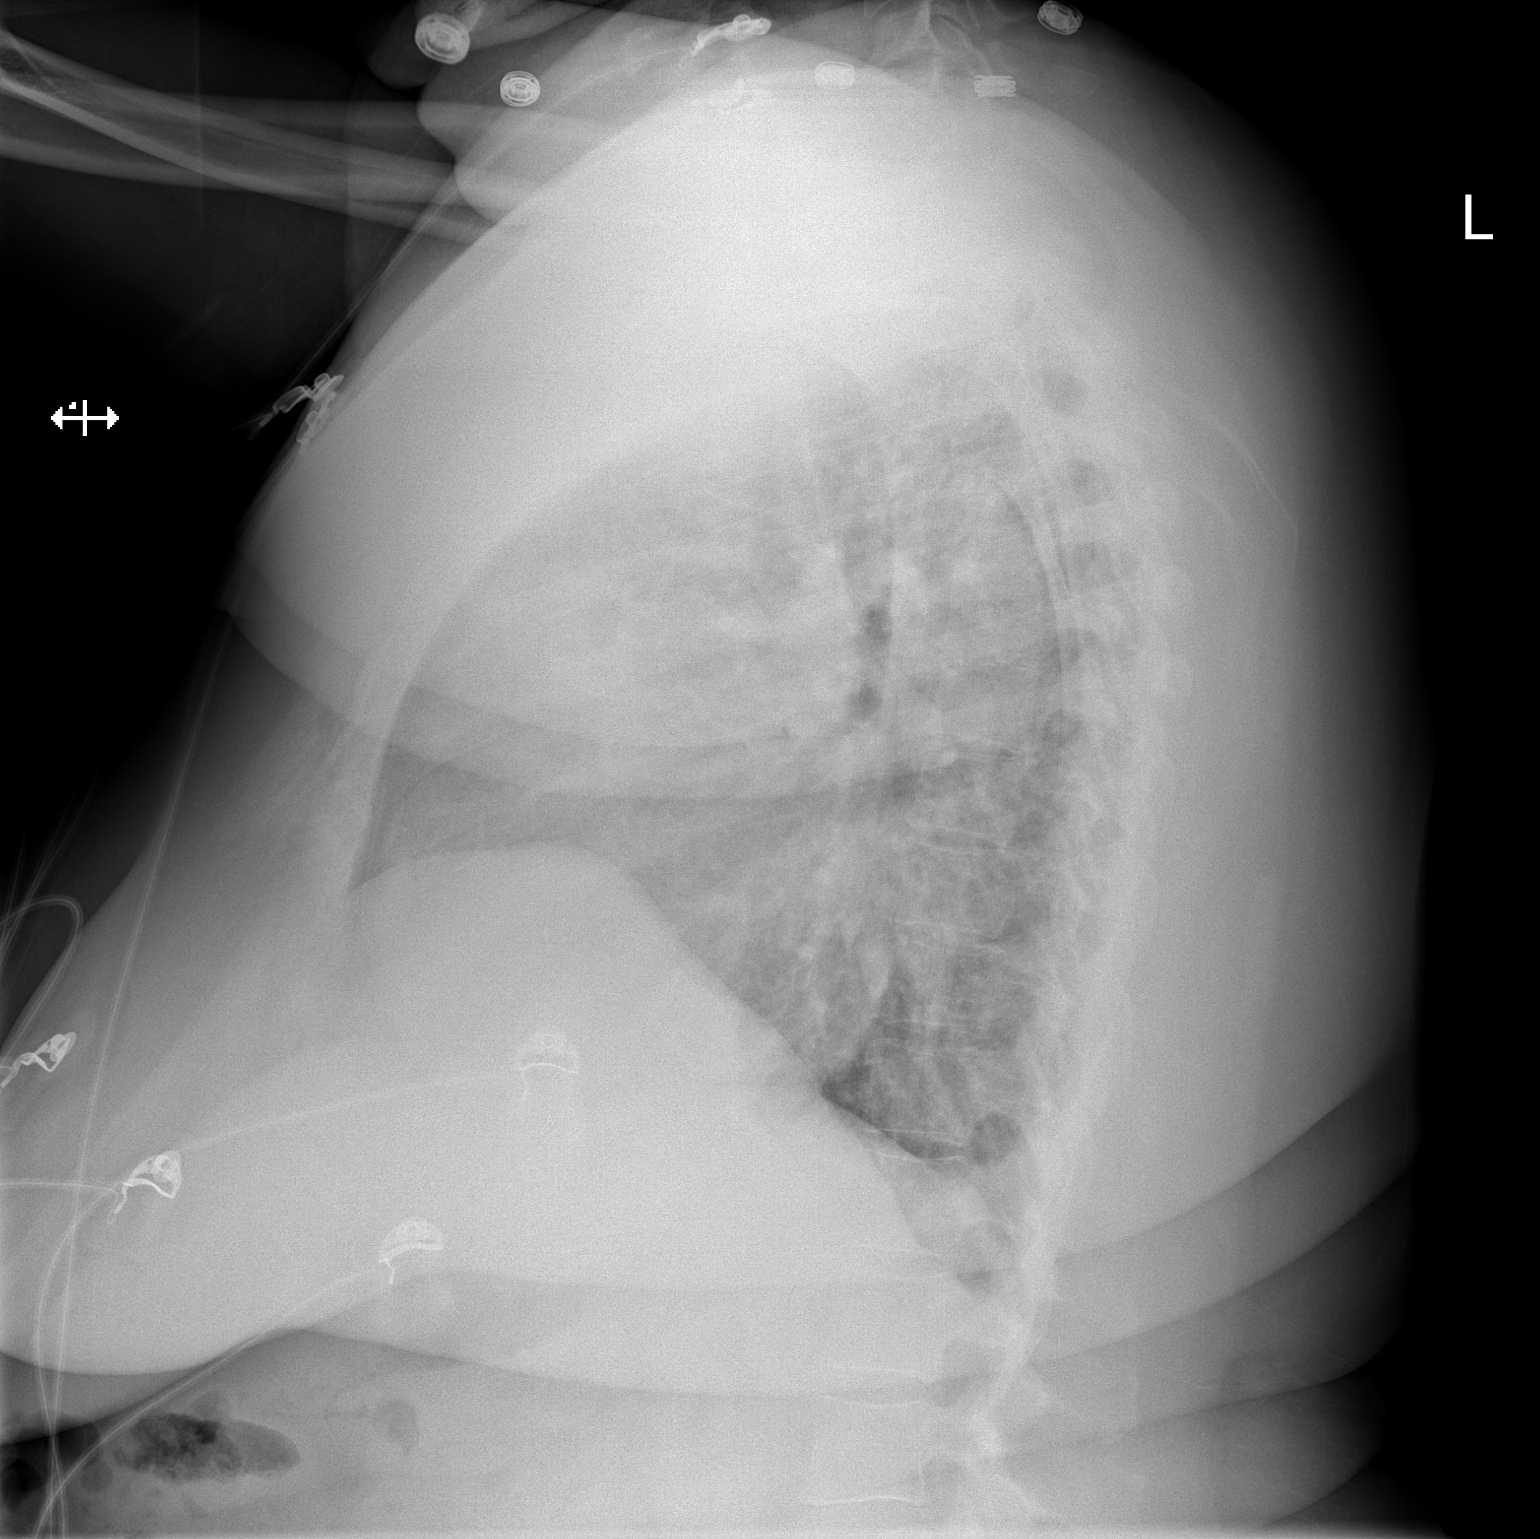

[2 of 2 positions shown; findings below may reference images not displayed]

FINDINGS: Cardiomegaly. Mild vascular congestion. No Kerley lines, effusion,
or pneumothorax.
IMPRESSION: Cardiomegaly and vascular congestion.

## 2022-07-31 ENCOUNTER — Other Ambulatory Visit (HOSPITAL_BASED_OUTPATIENT_CLINIC_OR_DEPARTMENT_OTHER): Payer: Self-pay

## 2022-08-16 ENCOUNTER — Ambulatory Visit: Payer: 59 | Admitting: Cardiology

## 2022-08-28 ENCOUNTER — Other Ambulatory Visit (HOSPITAL_BASED_OUTPATIENT_CLINIC_OR_DEPARTMENT_OTHER): Payer: Self-pay

## 2022-09-07 ENCOUNTER — Ambulatory Visit: Payer: 59 | Admitting: Cardiology

## 2022-09-11 ENCOUNTER — Other Ambulatory Visit (HOSPITAL_BASED_OUTPATIENT_CLINIC_OR_DEPARTMENT_OTHER): Payer: Self-pay

## 2022-09-12 ENCOUNTER — Ambulatory Visit: Payer: 59 | Admitting: Cardiology

## 2022-09-12 ENCOUNTER — Encounter: Payer: Self-pay | Admitting: Cardiology

## 2022-09-12 ENCOUNTER — Other Ambulatory Visit (HOSPITAL_BASED_OUTPATIENT_CLINIC_OR_DEPARTMENT_OTHER): Payer: Self-pay

## 2022-09-12 VITALS — BP 134/77 | HR 99 | Resp 16 | Ht 65.0 in | Wt 261.0 lb

## 2022-09-12 DIAGNOSIS — I420 Dilated cardiomyopathy: Secondary | ICD-10-CM

## 2022-09-12 DIAGNOSIS — I5020 Unspecified systolic (congestive) heart failure: Secondary | ICD-10-CM

## 2022-09-12 DIAGNOSIS — I428 Other cardiomyopathies: Secondary | ICD-10-CM

## 2022-09-12 MED ORDER — FUROSEMIDE 40 MG PO TABS
40.0000 mg | ORAL_TABLET | Freq: Every day | ORAL | 3 refills | Status: DC
Start: 2022-09-12 — End: 2024-07-06
  Filled 2022-09-12: qty 30, 30d supply, fill #0
  Filled 2023-01-08: qty 30, 30d supply, fill #1
  Filled 2023-05-08: qty 30, 30d supply, fill #2
  Filled 2023-06-02 – 2023-07-25 (×2): qty 30, 30d supply, fill #3

## 2022-09-12 NOTE — Progress Notes (Signed)
Subjective:   Paula Thompson, female    DOB: Jul 19, 1971, 51 y.o.   MRN: [id number]   Chief Complaint  Patient presents with   HFrEF    Follow-up    3 month     HPI  51 y.o. African-American female  with hypertension, nonischemic dilated cardiomyopathy  Patient has recently had leg edema requiring use of lasix 40 mg, which she only uses as needed. She denies overt dyspnea. She reports watching her salt intake.     Current Outpatient Medications:    atorvastatin (LIPITOR) 40 MG tablet, Take 1 tablet (40 mg total) by mouth daily., Disp: 90 tablet, Rfl: 3   dapagliflozin propanediol (FARXIGA) 10 MG TABS tablet, Take 1 tablet (10 mg total) by mouth daily., Disp: 90 tablet, Rfl: 3   diclofenac Sodium (VOLTAREN) 1 % GEL, Apply 4 g topically 4 (four) times daily., Disp: 100 g, Rfl: 11   fluconazole (DIFLUCAN) 150 MG tablet, Take one tablet by mouth at the first sign of symptoms of yeast. If no resolution, repeat dose in 72 hours., Disp: 2 tablet, Rfl: 4   furosemide (LASIX) 40 MG tablet, TAKE 1 TABLET (40 MG TOTAL) BY MOUTH DAILY AS NEEDED FOR EDEMA OR FLUID (LEG SWELLING OR MORE THAN USUAL SHORTNESS OF BREATH)., Disp: 90 tablet, Rfl: 0   ivabradine (CORLANOR) 7.5 MG TABS tablet, TAKE 1 TABLET (7.5 MG TOTAL) BY MOUTH 2 (TWO) TIMES DAILY WITH A MEAL., Disp: 180 tablet, Rfl: 3   metoprolol succinate (TOPROL-XL) 50 MG 24 hr tablet, Take 1 tablet (50 mg total) by mouth daily., Disp: 90 tablet, Rfl: 3   nystatin-triamcinolone ointment (MYCOLOG), Apply 1 Application topically 2 (two) times daily. Use very thin layer to area of itching., Disp: 30 g, Rfl: 2   pantoprazole (PROTONIX) 40 MG tablet, Take 1 tablet (40 mg total) by mouth daily. Take 50m twice a day for two weeks then cut down to 456monce a day., Disp: 90 tablet, Rfl: 3   sacubitril-valsartan (ENTRESTO) 49-51 MG, Take 1 tablet by mouth 2 (two) times daily., Disp: 180 tablet, Rfl: 3   spironolactone (ALDACTONE) 25 MG tablet, Take  0.5 tablets (12.5 mg total) by mouth daily., Disp: 45 tablet, Rfl: 3  Cardiovascular and other pertinent studies:  EKG 05/16/2022: Sinus tachycardia 109 bpm  Diffuse nonspecific T-abnormality.   Echocardiogram 11/02/2021: Mildly depressed LV systolic function with visual EF 40-45%. Left ventricle cavity is dilated. Mild left ventricular hypertrophy. Hypokinetic global wall motion. Doppler evidence of grade I (impaired) diastolic dysfunction, normal LAP. No significant valvular heart disease. Compared to study 07/18/2021 LVEF has improved from <20% to 40-45% otherwise no significant change.   LHC/RHC 07/25/2021: Tortuous vessels with no significant CAD   RA: 9 mmHg RV: 31/7 mmHg PA: 30/20 mmHg, mPAP 25 mmHg PCW: 14 mmHg   CO: 6.3 L/min CI: 2.8 L/min/m2   Mildly decompensated nonischemic cardiomyopathy  Echocardiogram 07/18/2021:  1. Left ventricular ejection fraction, by estimation, is <20%. The left  ventricle has severely decreased function. The left ventricle demonstrates  global hypokinesis. The left ventricular internal cavity size was severely  dilated. Left ventricular  diastolic function could not be evaluated.   2. Right ventricular systolic function is normal. The right ventricular  size is normal. Tricuspid regurgitation signal is inadequate for assessing  PA pressure.   3. A small pericardial effusion is present. The pericardial effusion is  circumferential. There is no evidence of cardiac tamponade.   4. The mitral valve  is normal in structure. No evidence of mitral valve  regurgitation. No evidence of mitral stenosis.   5. The aortic valve is normal in structure. Aortic valve regurgitation is  not visualized. No aortic stenosis is present.   6. The inferior vena cava is dilated in size with >50% respiratory  variability, suggesting right atrial pressure of 8 mmHg.    Recent labs: 06/12/2022: Chol 164, TG 172, HDL 49, LDL 86  07/19/2021: Glucose 159, BUN/Cr  17/0.71. EGFR >60. Na/K 139/3.8. Rest of the CMP normal H/H 13/42. MCV 74. Platelets 186 HbA1C 6.2% Chol 208, TG 159, HDL 40, LDL 139 TSH 1.9 normal    Review of Systems  Cardiovascular:  Positive for dyspnea on exertion. Negative for chest pain, leg swelling, palpitations and syncope.         Vitals:   09/12/22 1019  BP: 134/77  Pulse: 99  Resp: 16  SpO2: 98%     Body mass index is 43.43 kg/m. Filed Weights   09/12/22 1019  Weight: 261 lb (118.4 kg)     Objective:   Physical Exam Vitals and nursing note reviewed.  Constitutional:      General: She is not in acute distress.    Appearance: She is obese.  Neck:     Vascular: No JVD.  Cardiovascular:     Rate and Rhythm: Normal rate and regular rhythm.     Heart sounds: Normal heart sounds. No murmur heard. Pulmonary:     Effort: Pulmonary effort is normal.     Breath sounds: Normal breath sounds. No wheezing or rales.  Musculoskeletal:     Right lower leg: No edema.     Left lower leg: No edema.         Assessment & Recommendations:    51 y.o. African-American female  with hypertension, hyperlipidemia, nonischemic dilated cardiomyopathy  HFrEF: Nonischemic dilated cardiomyopathy.  EF <20%.  NYHA class II-III symptoms Currently on Entresto to 49-51 bid, metoprolol succinate 25 mg daily, spironolactone 25 mg daily, Corlanor 7.5 mg twice daily, Farxiga 10 mg daily.  Recommend low-salt diet EF improved to 40-45% in 10/2021. Recent leg edema. Take lasix 40 mg daily. Check daily weights.  Mixed hyperlipidemia: LDL down to 86 on Lipitor 40 mg daily.  F/u in 2 weeks   Nigel Mormon, MD Pager: [phone] Office: [phone]

## 2022-09-27 ENCOUNTER — Ambulatory Visit: Payer: 59

## 2022-09-27 VITALS — BP 117/63 | HR 101 | Resp 16 | Ht 65.0 in | Wt 258.0 lb

## 2022-09-27 DIAGNOSIS — I10 Essential (primary) hypertension: Secondary | ICD-10-CM

## 2022-09-27 DIAGNOSIS — I5020 Unspecified systolic (congestive) heart failure: Secondary | ICD-10-CM

## 2022-09-27 DIAGNOSIS — I428 Other cardiomyopathies: Secondary | ICD-10-CM

## 2022-09-27 NOTE — Progress Notes (Signed)
Subjective:   Paula Thompson, female    DOB: 06-09-1971, 51 y.o.   MRN: [id number]  Chief Complaint  Patient presents with   HFrEF   Follow-up    2 week   HPI  51 y.o. African-American female  with hypertension, nonischemic dilated cardiomyopathy  She presents today for 2 week follow up. At last visit she was started on Lasix 72m daily for lower extremity edema. Overall today, she is doing well without any complaints. Lower extremity edema has significantly improved. She has lost 3 pounds since last visit. She denies worsening dyspnea with exertion, chest pain, dizziness, or syncope.    Current Outpatient Medications:    atorvastatin (LIPITOR) 40 MG tablet, Take 1 tablet (40 mg total) by mouth daily., Disp: 90 tablet, Rfl: 3   dapagliflozin propanediol (FARXIGA) 10 MG TABS tablet, Take 1 tablet (10 mg total) by mouth daily., Disp: 90 tablet, Rfl: 3   diclofenac Sodium (VOLTAREN) 1 % GEL, Apply 4 g topically 4 (four) times daily., Disp: 100 g, Rfl: 11   furosemide (LASIX) 40 MG tablet, Take 1 tablet (40 mg total) by mouth daily., Disp: 90 tablet, Rfl: 3   ivabradine (CORLANOR) 7.5 MG TABS tablet, TAKE 1 TABLET (7.5 MG TOTAL) BY MOUTH 2 (TWO) TIMES DAILY WITH A MEAL., Disp: 180 tablet, Rfl: 3   metoprolol succinate (TOPROL-XL) 50 MG 24 hr tablet, Take 1 tablet (50 mg total) by mouth daily., Disp: 90 tablet, Rfl: 3   nystatin-triamcinolone ointment (MYCOLOG), Apply 1 Application topically 2 (two) times daily. Use very thin layer to area of itching., Disp: 30 g, Rfl: 2   pantoprazole (PROTONIX) 40 MG tablet, Take 1 tablet (40 mg total) by mouth daily. Take 461mtwice a day for two weeks then cut down to 4054mnce a day., Disp: 90 tablet, Rfl: 3   sacubitril-valsartan (ENTRESTO) 49-51 MG, Take 1 tablet by mouth 2 (two) times daily., Disp: 180 tablet, Rfl: 3   spironolactone (ALDACTONE) 25 MG tablet, Take 0.5 tablets (12.5 mg total) by mouth daily., Disp: 45 tablet, Rfl:  3  Cardiovascular and other pertinent studies:  EKG 05/16/2022: Sinus tachycardia 109 bpm  Diffuse nonspecific T-abnormality.   Echocardiogram 11/02/2021: Mildly depressed LV systolic function with visual EF 40-45%. Left ventricle cavity is dilated. Mild left ventricular hypertrophy. Hypokinetic global wall motion. Doppler evidence of grade I (impaired) diastolic dysfunction, normal LAP. No significant valvular heart disease. Compared to study 07/18/2021 LVEF has improved from <20% to 40-45% otherwise no significant change.  LHC/RHC 07/25/2021: Tortuous vessels with no significant CAD   RA: 9 mmHg RV: 31/7 mmHg PA: 30/20 mmHg, mPAP 25 mmHg PCW: 14 mmHg   CO: 6.3 L/min CI: 2.8 L/min/m2   Mildly decompensated nonischemic cardiomyopathy  Echocardiogram 07/18/2021:  1. Left ventricular ejection fraction, by estimation, is <20%. The left  ventricle has severely decreased function. The left ventricle demonstrates  global hypokinesis. The left ventricular internal cavity size was severely  dilated. Left ventricular  diastolic function could not be evaluated.   2. Right ventricular systolic function is normal. The right ventricular  size is normal. Tricuspid regurgitation signal is inadequate for assessing  PA pressure.   3. A small pericardial effusion is present. The pericardial effusion is  circumferential. There is no evidence of cardiac tamponade.   4. The mitral valve is normal in structure. No evidence of mitral valve  regurgitation. No evidence of mitral stenosis.   5. The aortic valve is normal in structure. Aortic  valve regurgitation is  not visualized. No aortic stenosis is present.   6. The inferior vena cava is dilated in size with >50% respiratory  variability, suggesting right atrial pressure of 8 mmHg.   Recent labs: 06/12/2022: Chol 164, TG 172, HDL 49, LDL 86  07/19/2021: Glucose 159, BUN/Cr 17/0.71. EGFR >60. Na/K 139/3.8. Rest of the CMP normal H/H 13/42.  MCV 74. Platelets 186 HbA1C 6.2% Chol 208, TG 159, HDL 40, LDL 139 TSH 1.9 normal  Review of Systems  Cardiovascular:  Positive for dyspnea on exertion (chronic,, stable). Negative for chest pain, leg swelling, palpitations and syncope.    Vitals:   09/27/22 1124  BP: 117/63  Pulse: (!) 101  Resp: 16  SpO2: 97%    Body mass index is 42.93 kg/m. Filed Weights   09/27/22 1124  Weight: 258 lb (117 kg)    Objective:   Physical Exam Vitals and nursing note reviewed.  Constitutional:      General: She is not in acute distress. Neck:     Vascular: No JVD.  Cardiovascular:     Rate and Rhythm: Normal rate and regular rhythm.     Heart sounds: Normal heart sounds. No murmur heard. Pulmonary:     Effort: Pulmonary effort is normal. No respiratory distress.     Breath sounds: Normal breath sounds. No wheezing or rales.  Musculoskeletal:     Right lower leg: Edema (trace) present.     Left lower leg: Edema (trace) present.    Assessment & Recommendations:   51 y.o. African-American female  with hypertension, hyperlipidemia, nonischemic dilated cardiomyopathy.  HFrEF (heart failure with reduced ejection fraction) (HCC) Nonischemic cardiomyopathy (HCC) Nonischemic dilated cardiomyopathy.  EF <20% 06/2021.  NYHA class II-III symptoms Currently on Entresto to 49-51 bid, metoprolol succinate 25 mg daily, spironolactone 25 mg daily, Corlanor 7.5 mg twice daily, Farxiga 10 mg daily and Lasix 52m daily. Recommend low-salt diet EF improved to 40-45% in 10/2021. Will check labs and continue lasix 411mdaily. Advised patient to check daily weights and keep a log of weights.  Primary hypertension Blood pressure is well controlled. Continue current medications without changes.  F/u in 3 months or sooner if needed.   BrErnst SpellAGVirginiaffice: [phone]: [phone]

## 2022-10-01 ENCOUNTER — Other Ambulatory Visit (HOSPITAL_BASED_OUTPATIENT_CLINIC_OR_DEPARTMENT_OTHER): Payer: Self-pay

## 2022-10-02 ENCOUNTER — Telehealth: Payer: Self-pay

## 2022-10-02 ENCOUNTER — Other Ambulatory Visit (HOSPITAL_BASED_OUTPATIENT_CLINIC_OR_DEPARTMENT_OTHER): Payer: Self-pay

## 2022-10-02 LAB — BASIC METABOLIC PANEL
BUN/Creatinine Ratio: 15 (ref 9–23)
BUN: 9 mg/dL (ref 6–24)
CO2: 23 mmol/L (ref 20–29)
Calcium: 9.4 mg/dL (ref 8.7–10.2)
Chloride: 100 mmol/L (ref 96–106)
Creatinine, Ser: 0.61 mg/dL (ref 0.57–1.00)
Glucose: 173 mg/dL — ABNORMAL HIGH (ref 70–99)
Potassium: 4.4 mmol/L (ref 3.5–5.2)
Sodium: 140 mmol/L (ref 134–144)
eGFR: 107 mL/min/{1.73_m2} (ref >59)

## 2022-10-02 LAB — PRO B NATRIURETIC PEPTIDE: NT-Pro BNP: <36 pg/mL (ref 0–249)

## 2022-10-02 NOTE — Telephone Encounter (Signed)
Discussed results with patient she verbalized understanding. 

## 2022-10-02 NOTE — Telephone Encounter (Signed)
-----   Message from Nori Riis, NP sent at 10/02/2022  9:02 AM EST ----- Kidney function and electrolytes are normal. Pro-BNP which measures overall fluid and heart failure status is normal.

## 2022-10-03 ENCOUNTER — Other Ambulatory Visit (HOSPITAL_BASED_OUTPATIENT_CLINIC_OR_DEPARTMENT_OTHER): Payer: Self-pay

## 2022-10-03 NOTE — Progress Notes (Signed)
Called patient to inform her about her lab results. Patient understood

## 2022-10-08 ENCOUNTER — Other Ambulatory Visit (HOSPITAL_BASED_OUTPATIENT_CLINIC_OR_DEPARTMENT_OTHER): Payer: Self-pay

## 2022-10-15 ENCOUNTER — Other Ambulatory Visit (HOSPITAL_BASED_OUTPATIENT_CLINIC_OR_DEPARTMENT_OTHER): Payer: Self-pay

## 2022-10-18 ENCOUNTER — Ambulatory Visit (HOSPITAL_BASED_OUTPATIENT_CLINIC_OR_DEPARTMENT_OTHER): Payer: Self-pay | Admitting: Orthopaedic Surgery

## 2022-10-31 ENCOUNTER — Ambulatory Visit (INDEPENDENT_AMBULATORY_CARE_PROVIDER_SITE_OTHER): Payer: Self-pay | Admitting: Orthopaedic Surgery

## 2022-10-31 DIAGNOSIS — M25561 Pain in right knee: Secondary | ICD-10-CM

## 2022-10-31 DIAGNOSIS — G8929 Other chronic pain: Secondary | ICD-10-CM

## 2022-10-31 DIAGNOSIS — M1711 Unilateral primary osteoarthritis, right knee: Secondary | ICD-10-CM

## 2022-10-31 MED ORDER — TRIAMCINOLONE ACETONIDE 40 MG/ML IJ SUSP
80.0000 mg | INTRAMUSCULAR | Status: AC | PRN
  Administered 2022-10-31: 80 mg via INTRA_ARTICULAR

## 2022-10-31 MED ORDER — LIDOCAINE HCL 1 % IJ SOLN
4.0000 mL | INTRAMUSCULAR | Status: AC | PRN
  Administered 2022-10-31: 4 mL

## 2022-10-31 NOTE — Progress Notes (Signed)
Chief Complaint: Bilateral shoulder pain     History of Present Illness:   10/31/2022: Paula Thompson presents today for her right knee pain.  This has been going on for several years for which I been treating her.  She has previously been trialing Tylenol and cannot take NSAIDs due to gastric ulcer.  She has been using her Voltaren topical gel which gives some relief.  She did get 1 week of relief from her previous injection.  She is here today for further discussion.  Paula Thompson is a 52 y.o. female right-hand-dominant female presents with left worse than right shoulder pain has been going on for several months.  She denied any specific injury or accident.  She states that the pains come on insidiously.  She states that the pain is predominantly located with overhead activity.  She has a hard time in particular getting the left arm in a forward position greater than 30 degrees.  Once she moves it up herself she is able to hold it there.  She has not had any injections or physical therapy.  She is having a hard time with hygiene and getting dressed.  She has a difficult time laying directly on the side.  She is not currently working.  But she has previously worked in the past as a Engineer, water History:   None  PMH/PSH/Family History/Social History/Meds/Allergies:    Past Medical History:  Diagnosis Date   Anemia    Blood transfusion without reported diagnosis    Gastric ulcer with hemorrhage    Past Surgical History:  Procedure Laterality Date   ESOPHAGOGASTRODUODENOSCOPY N/A 09/26/2014   Procedure: ESOPHAGOGASTRODUODENOSCOPY (EGD);  Surgeon: Beryle Beams, MD;  Location: [facility] ENDOSCOPY;  Service: Endoscopy;  Laterality: N/A;   RIGHT/LEFT HEART CATH AND CORONARY ANGIOGRAPHY N/A 07/25/2021   Procedure: RIGHT/LEFT HEART CATH AND CORONARY ANGIOGRAPHY;  Surgeon: Nigel Mormon, MD;  Location: [facility];  Service: Cardiovascular;  Laterality:  N/A;   Social History   Socioeconomic History   Marital status: Single    Spouse name: Not on file   Number of children: 2   Years of education: Not on file   Highest education level: Not on file  Occupational History   Occupation: MAINTENANCE  Tobacco Use   Smoking status: Some Days    Packs/day: 0.25    Types: Cigarettes   Smokeless tobacco: Never  Vaping Use   Vaping Use: Never used  Substance and Sexual Activity   Alcohol use: Yes    Comment: OCC   Drug use: No   Sexual activity: Not on file  Other Topics Concern   Not on file  Social History Narrative   Not on file   Social Determinants of Health   Financial Resource Strain: Not on file  Food Insecurity: Not on file  Transportation Needs: Not on file  Physical Activity: Not on file  Stress: Not on file  Social Connections: Not on file   Family History  Problem Relation Age of Onset   Hypertension Mother    Diabetes Father    Hypertension Sister    Hypertension Brother    Hyperlipidemia Brother    Heart disease Brother    Heart disease Maternal Grandmother    Cancer Maternal Grandfather    Lung cancer  Maternal Grandfather    Allergies  Allergen Reactions   Amoxicillin Hives   Current Outpatient Medications  Medication Sig Dispense Refill   atorvastatin (LIPITOR) 40 MG tablet Take 1 tablet (40 mg total) by mouth daily. 90 tablet 3   dapagliflozin propanediol (FARXIGA) 10 MG TABS tablet Take 1 tablet (10 mg total) by mouth daily. 90 tablet 3   diclofenac Sodium (VOLTAREN) 1 % GEL Apply 4 g topically 4 (four) times daily. 100 g 11   fluconazole (DIFLUCAN) 150 MG tablet Take one tablet by mouth at the first sign of symptoms of yeast. If no resolution, repeat dose in 72 hours. 2 tablet 4   furosemide (LASIX) 40 MG tablet Take 1 tablet (40 mg total) by mouth daily. 90 tablet 3   ivabradine (CORLANOR) 7.5 MG TABS tablet TAKE 1 TABLET (7.5 MG TOTAL) BY MOUTH 2 (TWO) TIMES DAILY WITH A MEAL. 180 tablet 3    metoprolol succinate (TOPROL-XL) 50 MG 24 hr tablet Take 1 tablet (50 mg total) by mouth daily. 90 tablet 3   nystatin-triamcinolone ointment (MYCOLOG) Apply 1 Application topically 2 (two) times daily. Use very thin layer to area of itching. 30 g 2   pantoprazole (PROTONIX) 40 MG tablet Take 1 tablet (40 mg total) by mouth daily. Take 40mg  twice a day for two weeks then cut down to 40mg  once a day. 90 tablet 3   sacubitril-valsartan (ENTRESTO) 49-51 MG Take 1 tablet by mouth 2 (two) times daily. 180 tablet 3   spironolactone (ALDACTONE) 25 MG tablet Take 0.5 tablets (12.5 mg total) by mouth daily. 45 tablet 3   No current facility-administered medications for this visit.   No results found.  Review of Systems:   A ROS was performed including pertinent positives and negatives as documented in the HPI.  Physical Exam :   Constitutional: NAD and appears stated age Neurological: Alert and oriented Psych: Appropriate affect and cooperative There were no vitals taken for this visit.   Comprehensive Musculoskeletal Exam:    Musculoskeletal Exam    Inspection Right Left  Skin No atrophy or winging No atrophy or winging  Palpation    Tenderness None Greater tuberosity  Range of Motion    Flexion (passive) 170 170  Flexion (active) 170 170 while assisted  Abduction 170 170  ER at the side 70 45  Can reach behind back to L1 Back pocket  Strength     Full Weakness with forward elevation and external rotation at the side  Special Tests    Pseudoparalytic No No  Neurologic    Fires PIN, radial, median, ulnar, musculocutaneous, axillary, suprascapular, long thoracic, and spinal accessory innervated muscles. No abnormal sensibility  Vascular/Lymphatic    Radial Pulse 2+ 2+  Cervical Exam    Patient has symmetric cervical range of motion with negative Spurling's test.  Special Test: Positive Neer impingement bilaterally    Right knee with tenderness to palpation over the lateral joint  space predominantly.  No laxity with varus or valgus negative Lachman positive crepitus trace effusion.  Range of motion is from 0 to 120 degrees.  There is a small effusion   Imaging:   Xray (3 views right shoulder, 3 views left shoulder, right knee 4 views): Normal  Significant arthritis involving the lateral tibiofemoral joint space   I personally reviewed and interpreted the radiographs.   Assessment:   52 year old female with right severe lateral tibiofemoral osteoarthritis.  At today's visit we did discuss treatment options.  I did discuss specifically that she does have an elevated BMI although she does not carry this around the knee.  Do believe she would ultimately be a candidate for knee arthroplasty with some weight loss.  I would like her to get established with Dr. Ninfa Linden although I did discuss that typically the BMI of 40 is used as a cutoff for arthroplasty.  She understands the importance of this.  In the meantime she would like an injection for her right knee in order to help her maintain activity. Plan :    -Plan for referral to Dr. Ninfa Linden for discussion of knee arthroplasty     Procedure Note  Patient: Paula Thompson             Date of Birth: [birth year]           MRN: [id number]             Visit Date: 10/31/2022  Procedures: Visit Diagnoses:  No diagnosis found.    Large Joint Inj: R knee on 10/31/2022 1:42 PM Indications: pain Details: 22 G 1.5 in needle, ultrasound-guided anterior approach  Arthrogram: No  Medications: 4 mL lidocaine 1 %; 80 mg triamcinolone acetonide 40 MG/ML Outcome: tolerated well, no immediate complications Procedure, treatment alternatives, risks and benefits explained, specific risks discussed. Consent was given by the patient. Immediately prior to procedure a time out was called to verify the correct patient, procedure, equipment, support staff and site/side marked as required. Patient was prepped and draped in the usual  sterile fashion.       I personally saw and evaluated the patient, and participated in the management and treatment plan.  Vanetta Mulders, MD Attending Physician, Orthopedic Surgery  This document was dictated using Dragon voice recognition software. A reasonable attempt at proof reading has been made to minimize errors.

## 2022-11-02 ENCOUNTER — Other Ambulatory Visit (HOSPITAL_BASED_OUTPATIENT_CLINIC_OR_DEPARTMENT_OTHER): Payer: Self-pay

## 2022-11-02 ENCOUNTER — Encounter (HOSPITAL_BASED_OUTPATIENT_CLINIC_OR_DEPARTMENT_OTHER): Payer: Self-pay | Admitting: Pharmacist

## 2022-11-02 ENCOUNTER — Other Ambulatory Visit: Payer: Self-pay

## 2022-11-03 ENCOUNTER — Other Ambulatory Visit (HOSPITAL_BASED_OUTPATIENT_CLINIC_OR_DEPARTMENT_OTHER): Payer: Self-pay

## 2022-11-03 ENCOUNTER — Other Ambulatory Visit: Payer: Self-pay | Admitting: Nurse Practitioner

## 2022-11-03 DIAGNOSIS — I428 Other cardiomyopathies: Secondary | ICD-10-CM

## 2022-11-03 DIAGNOSIS — I509 Heart failure, unspecified: Secondary | ICD-10-CM

## 2022-11-03 DIAGNOSIS — I5020 Unspecified systolic (congestive) heart failure: Secondary | ICD-10-CM

## 2022-11-05 ENCOUNTER — Telehealth: Payer: Self-pay

## 2022-11-05 ENCOUNTER — Other Ambulatory Visit (HOSPITAL_BASED_OUTPATIENT_CLINIC_OR_DEPARTMENT_OTHER): Payer: Self-pay

## 2022-11-05 NOTE — Telephone Encounter (Signed)
Patient called to inform us that her farxiga is not covered by her insurance. Patient asked is there another medication she can take that her insurance would cover please advise

## 2022-11-05 NOTE — Telephone Encounter (Signed)
Please provide samples and look into patient assistance. If that does not work, alternate option is Jardiance 10 mg daily.  Thanks MJP

## 2022-11-06 ENCOUNTER — Encounter: Payer: Self-pay | Admitting: Internal Medicine

## 2022-11-06 NOTE — Telephone Encounter (Signed)
Called patient to inform her about the message above.

## 2022-11-07 ENCOUNTER — Other Ambulatory Visit (HOSPITAL_BASED_OUTPATIENT_CLINIC_OR_DEPARTMENT_OTHER): Payer: Self-pay

## 2022-11-12 ENCOUNTER — Other Ambulatory Visit (HOSPITAL_BASED_OUTPATIENT_CLINIC_OR_DEPARTMENT_OTHER): Payer: Self-pay

## 2022-11-12 ENCOUNTER — Other Ambulatory Visit: Payer: Self-pay

## 2022-11-13 ENCOUNTER — Other Ambulatory Visit (HOSPITAL_BASED_OUTPATIENT_CLINIC_OR_DEPARTMENT_OTHER): Payer: Self-pay

## 2022-11-13 ENCOUNTER — Encounter (HOSPITAL_BASED_OUTPATIENT_CLINIC_OR_DEPARTMENT_OTHER): Payer: Self-pay | Admitting: Pharmacist

## 2022-11-14 ENCOUNTER — Other Ambulatory Visit: Payer: Self-pay

## 2022-11-15 ENCOUNTER — Telehealth: Payer: Self-pay | Admitting: Orthopaedic Surgery

## 2022-11-20 ENCOUNTER — Ambulatory Visit: Payer: 59 | Admitting: Orthopaedic Surgery

## 2022-11-21 ENCOUNTER — Other Ambulatory Visit (HOSPITAL_BASED_OUTPATIENT_CLINIC_OR_DEPARTMENT_OTHER): Payer: Self-pay

## 2022-11-26 ENCOUNTER — Other Ambulatory Visit (HOSPITAL_BASED_OUTPATIENT_CLINIC_OR_DEPARTMENT_OTHER): Payer: Self-pay

## 2022-11-28 ENCOUNTER — Other Ambulatory Visit (HOSPITAL_BASED_OUTPATIENT_CLINIC_OR_DEPARTMENT_OTHER): Payer: Self-pay

## 2022-12-11 ENCOUNTER — Ambulatory Visit: Payer: 59 | Admitting: Orthopaedic Surgery

## 2022-12-11 ENCOUNTER — Other Ambulatory Visit (HOSPITAL_BASED_OUTPATIENT_CLINIC_OR_DEPARTMENT_OTHER): Payer: Self-pay

## 2022-12-11 ENCOUNTER — Other Ambulatory Visit: Payer: Self-pay

## 2022-12-11 DIAGNOSIS — I428 Other cardiomyopathies: Secondary | ICD-10-CM

## 2022-12-11 DIAGNOSIS — I5020 Unspecified systolic (congestive) heart failure: Secondary | ICD-10-CM

## 2022-12-11 DIAGNOSIS — I509 Heart failure, unspecified: Secondary | ICD-10-CM

## 2022-12-11 MED ORDER — DAPAGLIFLOZIN PROPANEDIOL 10 MG PO TABS
10.0000 mg | ORAL_TABLET | Freq: Every day | ORAL | 3 refills | Status: DC
  Filled 2022-12-11: qty 30, 30d supply, fill #0
  Filled 2023-01-11: qty 30, 30d supply, fill #1
  Filled 2023-03-05: qty 30, 30d supply, fill #2
  Filled 2023-04-06 – 2023-04-26 (×3): qty 30, 30d supply, fill #3
  Filled 2023-06-05 – 2023-07-25 (×2): qty 30, 30d supply, fill #4
  Filled 2023-09-04 – 2023-09-17 (×2): qty 30, 30d supply, fill #5
  Filled 2023-11-01 – 2023-11-12 (×2): qty 30, 30d supply, fill #6

## 2022-12-20 ENCOUNTER — Other Ambulatory Visit (HOSPITAL_BASED_OUTPATIENT_CLINIC_OR_DEPARTMENT_OTHER): Payer: Self-pay

## 2022-12-26 ENCOUNTER — Ambulatory Visit: Payer: 59 | Admitting: Cardiology

## 2022-12-26 ENCOUNTER — Encounter: Payer: Self-pay | Admitting: Cardiology

## 2022-12-26 ENCOUNTER — Other Ambulatory Visit (HOSPITAL_BASED_OUTPATIENT_CLINIC_OR_DEPARTMENT_OTHER): Payer: Self-pay

## 2022-12-26 ENCOUNTER — Ambulatory Visit: Payer: 59

## 2022-12-26 VITALS — BP 124/64 | HR 110 | Resp 16 | Ht 65.0 in | Wt 252.0 lb

## 2022-12-26 DIAGNOSIS — E1169 Type 2 diabetes mellitus with other specified complication: Secondary | ICD-10-CM | POA: Insufficient documentation

## 2022-12-26 DIAGNOSIS — I428 Other cardiomyopathies: Secondary | ICD-10-CM

## 2022-12-26 DIAGNOSIS — I5020 Unspecified systolic (congestive) heart failure: Secondary | ICD-10-CM

## 2022-12-26 DIAGNOSIS — E782 Mixed hyperlipidemia: Secondary | ICD-10-CM | POA: Insufficient documentation

## 2022-12-26 MED ORDER — METOPROLOL SUCCINATE ER 100 MG PO TB24
100.0000 mg | ORAL_TABLET | Freq: Every day | ORAL | 3 refills | Status: DC
  Filled 2022-12-26 – 2023-01-04 (×2): qty 30, 30d supply, fill #0
  Filled 2023-02-01 – 2023-02-13 (×3): qty 30, 30d supply, fill #1
  Filled 2023-03-27: qty 30, 30d supply, fill #2
  Filled 2023-05-08: qty 30, 30d supply, fill #3
  Filled 2023-07-25: qty 30, 30d supply, fill #4
  Filled 2023-10-01: qty 90, 90d supply, fill #5

## 2022-12-26 MED ORDER — SPIRONOLACTONE 25 MG PO TABS
25.0000 mg | ORAL_TABLET | Freq: Every day | ORAL | 3 refills | Status: DC
  Filled 2022-12-26 – 2023-01-04 (×2): qty 30, 30d supply, fill #0
  Filled 2023-03-05: qty 30, 30d supply, fill #1
  Filled 2023-04-01: qty 30, 30d supply, fill #2
  Filled 2023-07-25: qty 30, 30d supply, fill #3
  Filled 2023-09-04 – 2023-09-17 (×2): qty 30, 30d supply, fill #4
  Filled 2023-11-01 – 2023-11-12 (×2): qty 30, 30d supply, fill #5

## 2022-12-26 NOTE — Progress Notes (Signed)
Subjective:   Paula Thompson, female    DOB: 1971-05-17, 52 y.o.   MRN: [id number]   Chief Complaint  Patient presents with   HFrEF   Follow-up    3 month     HPI  52 y.o. African-American female  with hypertension, nonischemic dilated cardiomyopathy  Patient has exertional dyspnea with more than usual activities, as well as occasional leg edema. She is currently not taking Corlanor, appears that insurance did not approve it.     Current Outpatient Medications:    atorvastatin (LIPITOR) 40 MG tablet, Take 1 tablet (40 mg total) by mouth daily., Disp: 90 tablet, Rfl: 3   dapagliflozin propanediol (FARXIGA) 10 MG TABS tablet, Take 1 tablet (10 mg total) by mouth daily., Disp: 90 tablet, Rfl: 3   diclofenac Sodium (VOLTAREN) 1 % GEL, Apply 4 g topically 4 (four) times daily., Disp: 100 g, Rfl: 11   fluconazole (DIFLUCAN) 150 MG tablet, Take one tablet by mouth at the first sign of symptoms of yeast. If no resolution, repeat dose in 72 hours., Disp: 2 tablet, Rfl: 4   furosemide (LASIX) 40 MG tablet, Take 1 tablet (40 mg total) by mouth daily., Disp: 90 tablet, Rfl: 3   ivabradine (CORLANOR) 7.5 MG TABS tablet, TAKE 1 TABLET (7.5 MG TOTAL) BY MOUTH 2 (TWO) TIMES DAILY WITH A MEAL., Disp: 180 tablet, Rfl: 3   metoprolol succinate (TOPROL-XL) 50 MG 24 hr tablet, Take 1 tablet (50 mg total) by mouth daily., Disp: 90 tablet, Rfl: 3   nystatin-triamcinolone ointment (MYCOLOG), Apply 1 Application topically 2 (two) times daily. Use very thin layer to area of itching., Disp: 30 g, Rfl: 2   pantoprazole (PROTONIX) 40 MG tablet, Take 1 tablet (40 mg total) by mouth daily. Take '40mg'$  twice a day for two weeks then cut down to '40mg'$  once a day., Disp: 90 tablet, Rfl: 3   sacubitril-valsartan (ENTRESTO) 49-51 MG, Take 1 tablet by mouth 2 (two) times daily., Disp: 180 tablet, Rfl: 3   spironolactone (ALDACTONE) 25 MG tablet, Take 0.5 tablets (12.5 mg total) by mouth daily., Disp: 45 tablet, Rfl:  3  Cardiovascular and other pertinent studies:  EKG 12/26/2022: Sinus tachycardia 107 bpm  Diffuse nonspecific T-abnormality  Echocardiogram 11/02/2021: Mildly depressed LV systolic function with visual EF 40-45%. Left ventricle cavity is dilated. Mild left ventricular hypertrophy. Hypokinetic global wall motion. Doppler evidence of grade I (impaired) diastolic dysfunction, normal LAP. No significant valvular heart disease. Compared to study 07/18/2021 LVEF has improved from <20% to 40-45% otherwise no significant change.   LHC/RHC 07/25/2021: Tortuous vessels with no significant CAD   RA: 9 mmHg RV: 31/7 mmHg PA: 30/20 mmHg, mPAP 25 mmHg PCW: 14 mmHg   CO: 6.3 L/min CI: 2.8 L/min/m2   Mildly decompensated nonischemic cardiomyopathy  Echocardiogram 07/18/2021:  1. Left ventricular ejection fraction, by estimation, is <20%. The left  ventricle has severely decreased function. The left ventricle demonstrates  global hypokinesis. The left ventricular internal cavity size was severely  dilated. Left ventricular  diastolic function could not be evaluated.   2. Right ventricular systolic function is normal. The right ventricular  size is normal. Tricuspid regurgitation signal is inadequate for assessing  PA pressure.   3. A small pericardial effusion is present. The pericardial effusion is  circumferential. There is no evidence of cardiac tamponade.   4. The mitral valve is normal in structure. No evidence of mitral valve  regurgitation. No evidence of mitral stenosis.  5. The aortic valve is normal in structure. Aortic valve regurgitation is  not visualized. No aortic stenosis is present.   6. The inferior vena cava is dilated in size with >50% respiratory  variability, suggesting right atrial pressure of 8 mmHg.    Recent labs: 10/01/2022: Glucose 173, BUN/Cr 9/0.61. EGFR 107. Na/K 140/4.4.  06/12/2022: Chol 164, TG 172, HDL 49, LDL 86  07/19/2021: Glucose 159, BUN/Cr  17/0.71. EGFR >60. Na/K 139/3.8. Rest of the CMP normal H/H 13/42. MCV 74. Platelets 186 HbA1C 6.2% Chol 208, TG 159, HDL 40, LDL 139 TSH 1.9 normal    Review of Systems  Cardiovascular:  Positive for dyspnea on exertion and leg swelling. Negative for chest pain, palpitations and syncope.         Vitals:   12/26/22 1305  BP: 124/64  Pulse: (!) 110  Resp: 16  SpO2: 96%     Body mass index is 41.93 kg/m. Filed Weights   12/26/22 1305  Weight: 252 lb (114.3 kg)     Objective:   Physical Exam Vitals and nursing note reviewed.  Constitutional:      General: She is not in acute distress.    Appearance: She is obese.  Neck:     Vascular: No JVD.  Cardiovascular:     Rate and Rhythm: Normal rate and regular rhythm.     Heart sounds: Normal heart sounds. No murmur heard. Pulmonary:     Effort: Pulmonary effort is normal.     Breath sounds: Normal breath sounds. No wheezing or rales.  Musculoskeletal:     Right lower leg: No edema.     Left lower leg: No edema.         Assessment & Recommendations:    52 y.o. African-American female  with hypertension, hyperlipidemia, nonischemic dilated cardiomyopathy  HFrEF: Nonischemic dilated cardiomyopathy.  EF <20%.  NYHA class II-III symptoms EF improved to 40-45% in 10/2021. Currently on Entresto to 49-51 bid, metoprolol succinate 50 mg daily, spironolactone 12.5 mg daily, Farxiga 10 mg daily.  Increase metoprolol succinate to 100 mg daily, spironolactone to 25 mg daily. Recommend low-salt diet Check BMP, proBNP in 1 week. Check echocardiogram in 4 weeks.  Mixed hyperlipidemia: Continue Lipitor 40 mg daily.  F/u in 6 weeks     Nigel Mormon, MD Pager: [phone] Office: [phone]

## 2022-12-30 IMAGING — DX DG SHOULDER 2+V*L*
3 series · 3 of 3 positions shown · non-contrast
Comparison: None.

CLINICAL DATA: Bilateral shoulder pain

EXAM:
LEFT SHOULDER - 2+ VIEW

[shoulder grashey]
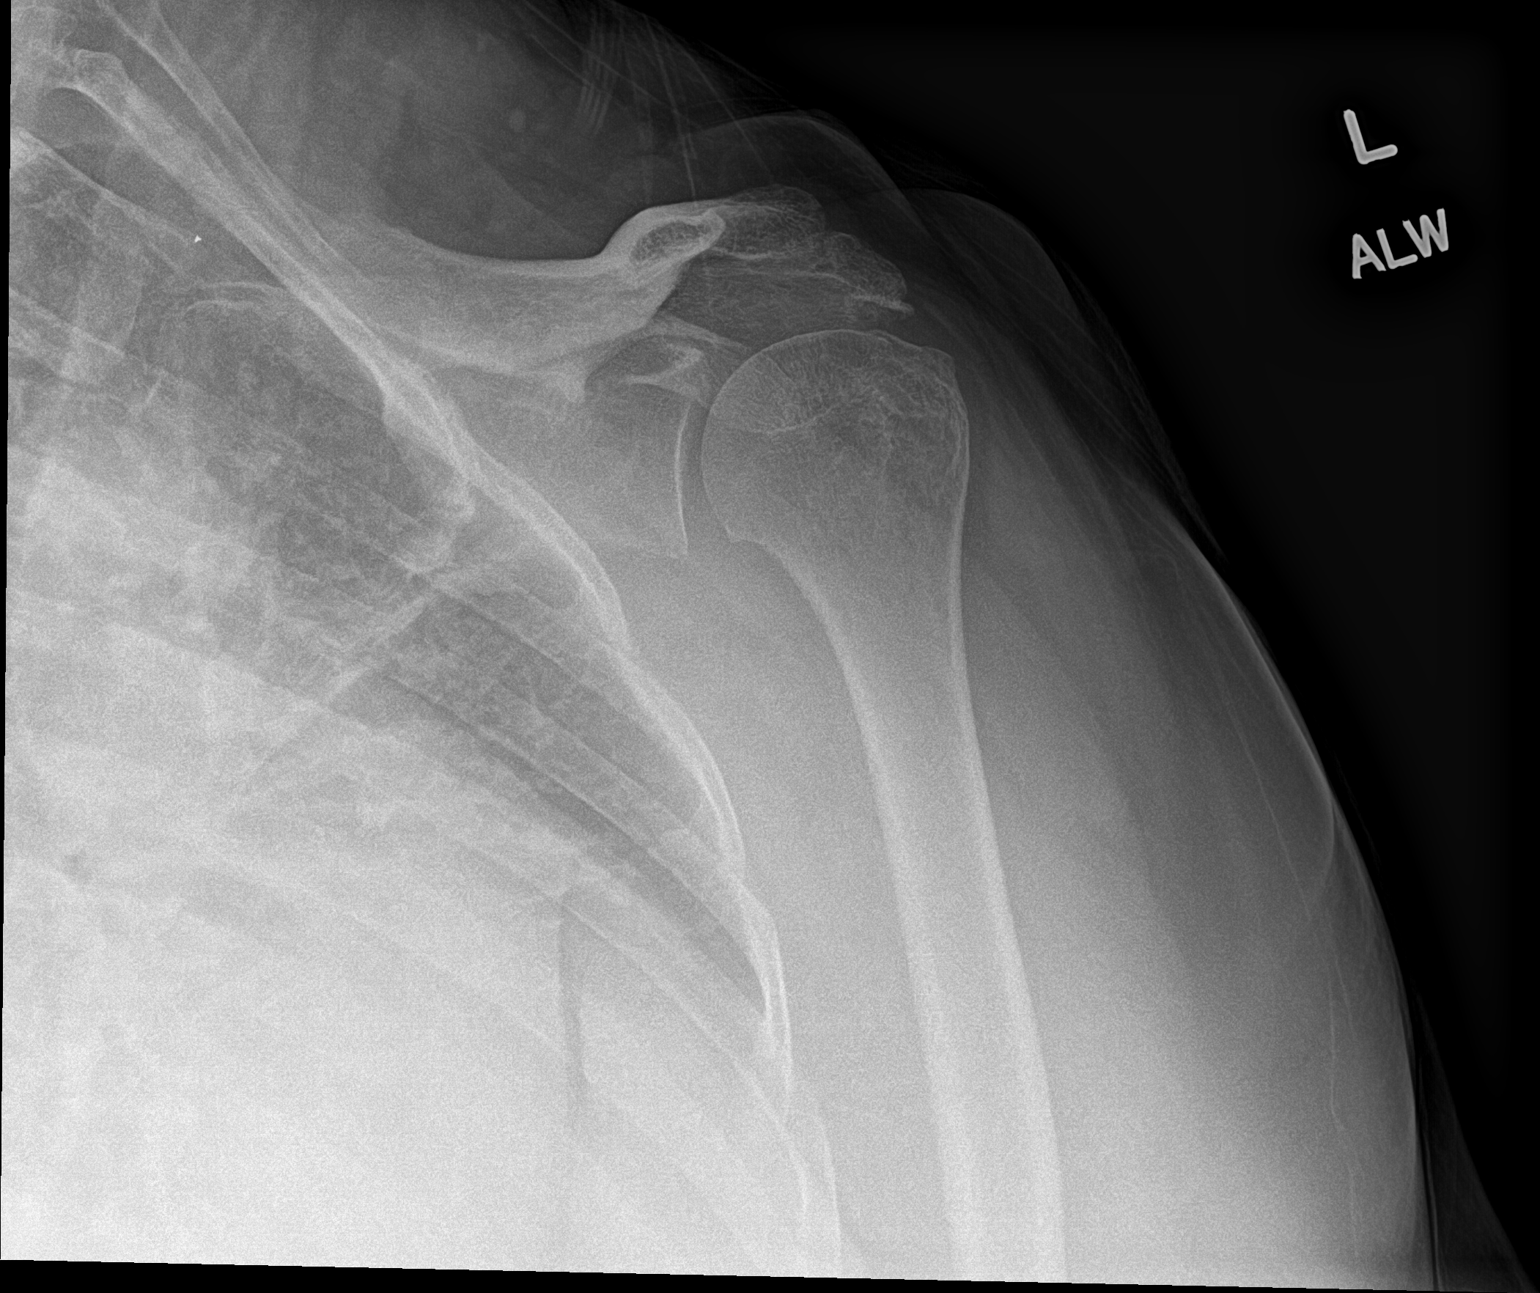

[shoulder y view]
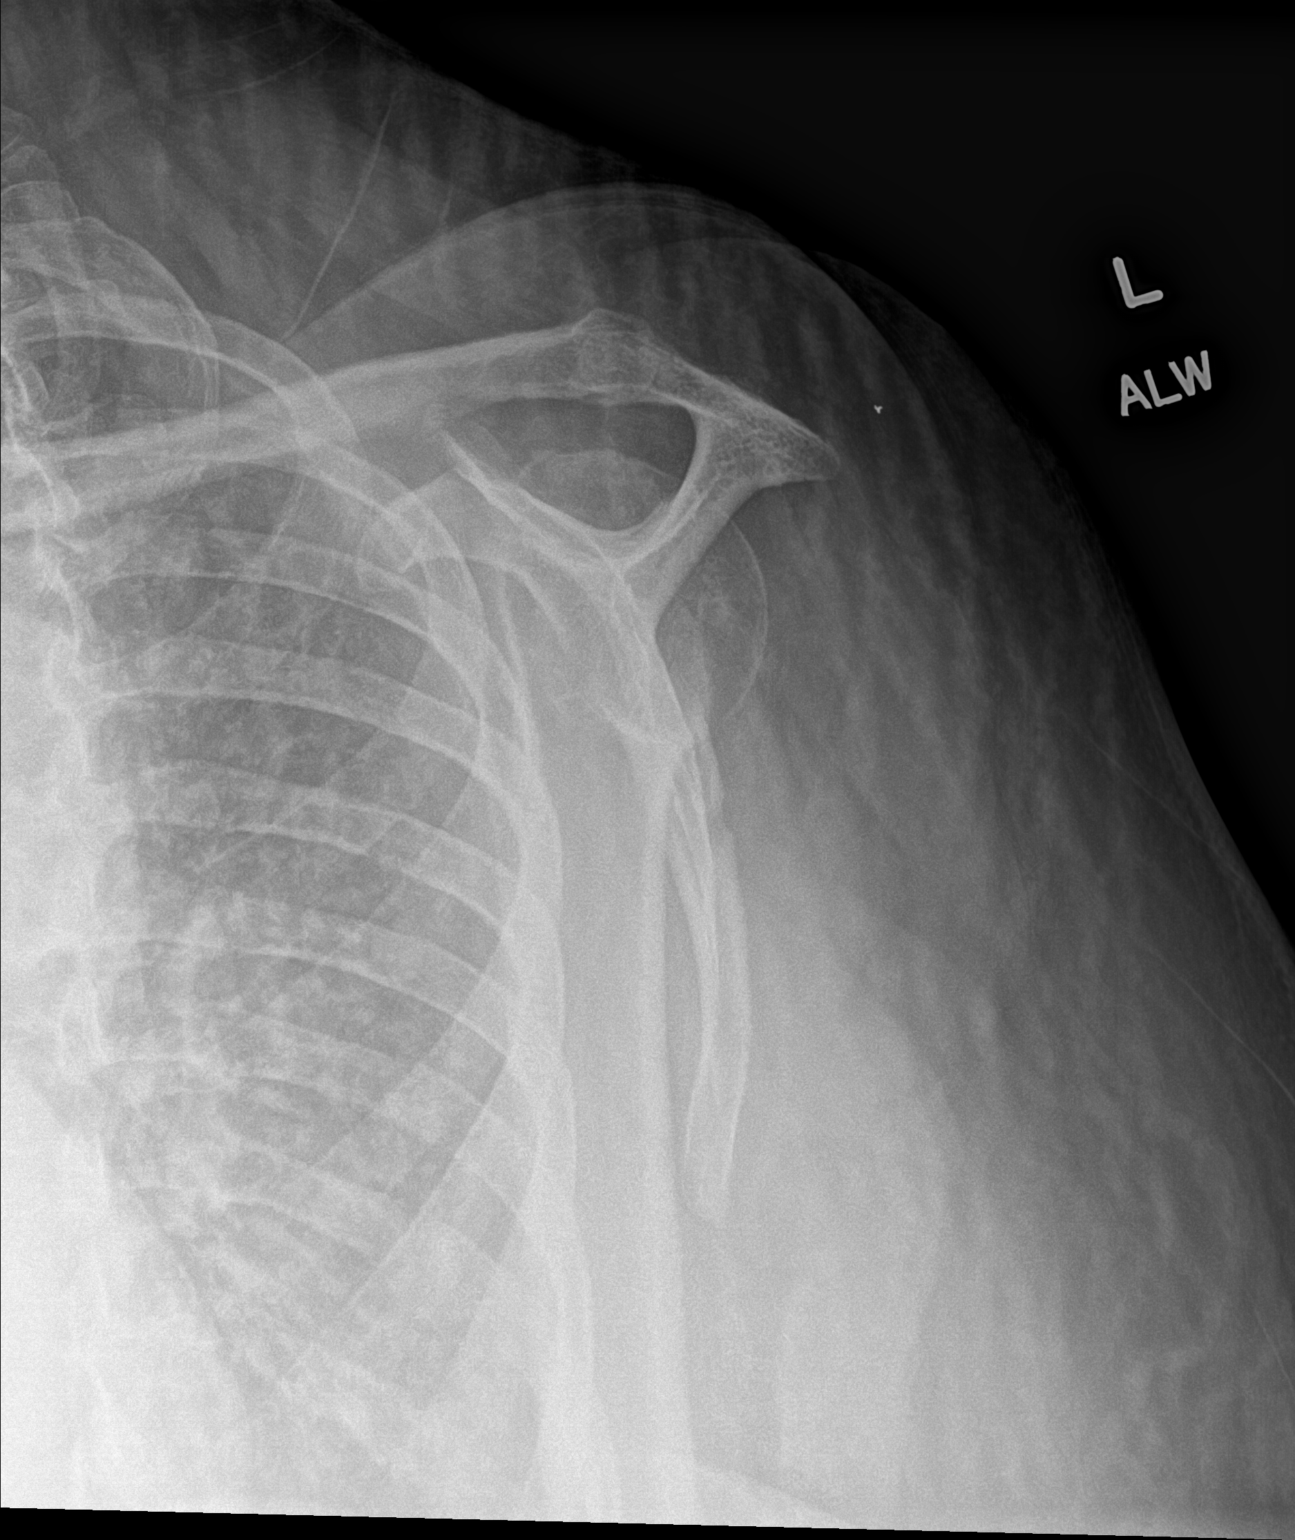

[shoulder axillary]
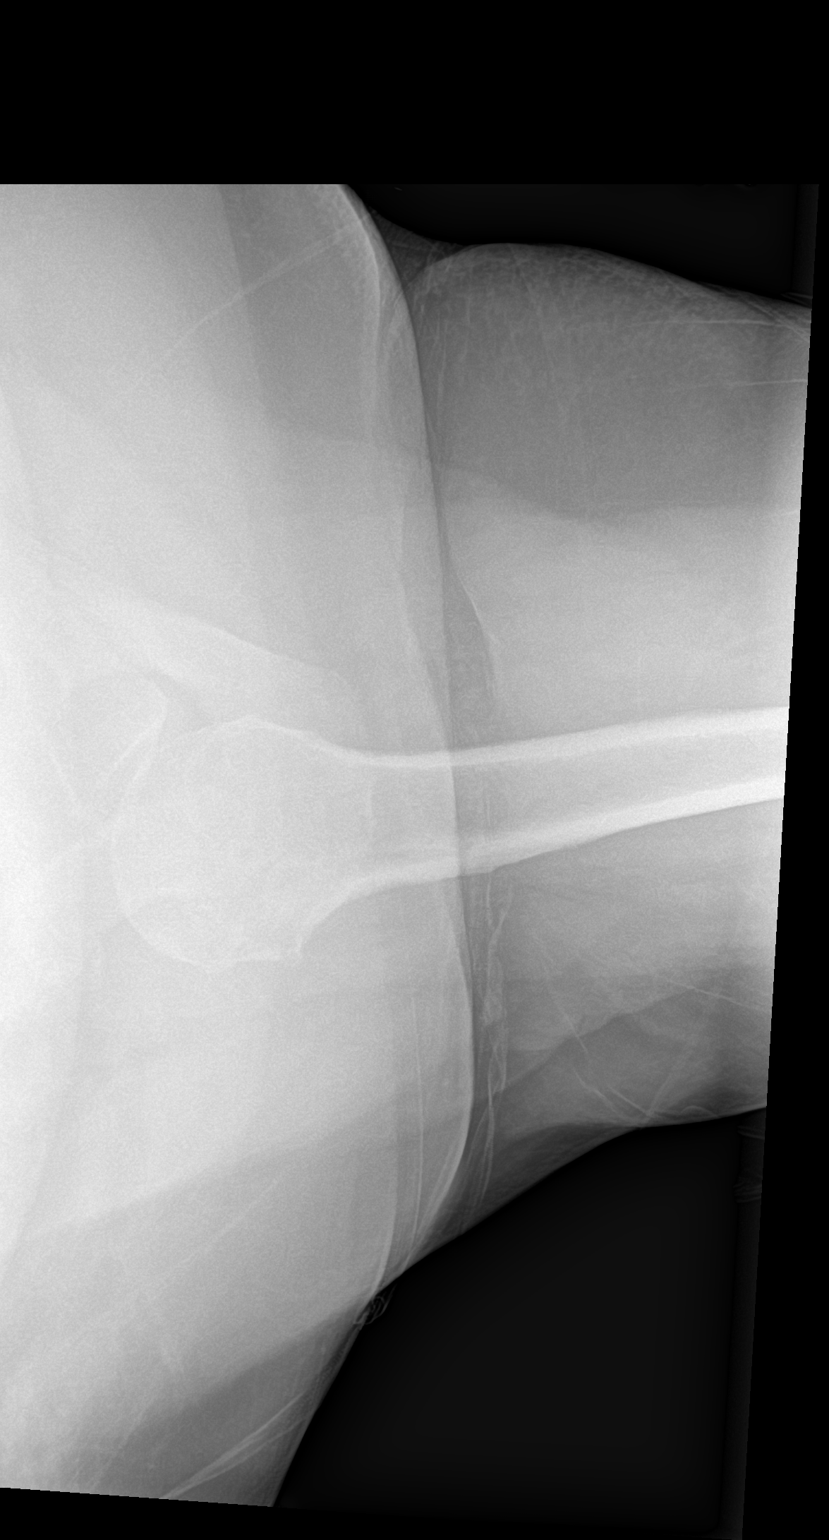

[3 of 3 positions shown; findings below may reference images not displayed]

FINDINGS: Normal alignment. No acute fracture or dislocation. Glenohumeral
joint space is preserved. Acromioclavicular joint space is not
optimally profiled but appears unremarkable.
IMPRESSION: Negative.

## 2022-12-31 ENCOUNTER — Other Ambulatory Visit (HOSPITAL_BASED_OUTPATIENT_CLINIC_OR_DEPARTMENT_OTHER): Payer: Self-pay

## 2023-01-03 ENCOUNTER — Other Ambulatory Visit (HOSPITAL_BASED_OUTPATIENT_CLINIC_OR_DEPARTMENT_OTHER): Payer: Self-pay

## 2023-01-04 ENCOUNTER — Other Ambulatory Visit (HOSPITAL_BASED_OUTPATIENT_CLINIC_OR_DEPARTMENT_OTHER): Payer: Self-pay

## 2023-01-08 LAB — BASIC METABOLIC PANEL
BUN/Creatinine Ratio: 17 (ref 9–23)
BUN: 12 mg/dL (ref 6–24)
CO2: 20 mmol/L (ref 20–29)
Calcium: 9.6 mg/dL (ref 8.7–10.2)
Chloride: 100 mmol/L (ref 96–106)
Creatinine, Ser: 0.69 mg/dL (ref 0.57–1.00)
Glucose: 248 mg/dL — ABNORMAL HIGH (ref 70–99)
Potassium: 3.8 mmol/L (ref 3.5–5.2)
Sodium: 139 mmol/L (ref 134–144)
eGFR: 103 mL/min/{1.73_m2} (ref >59)

## 2023-01-08 LAB — PRO B NATRIURETIC PEPTIDE: NT-Pro BNP: <36 pg/mL (ref 0–249)

## 2023-01-11 ENCOUNTER — Other Ambulatory Visit: Payer: Self-pay

## 2023-01-30 ENCOUNTER — Other Ambulatory Visit: Payer: 59

## 2023-02-03 ENCOUNTER — Other Ambulatory Visit (HOSPITAL_BASED_OUTPATIENT_CLINIC_OR_DEPARTMENT_OTHER): Payer: Self-pay

## 2023-02-11 ENCOUNTER — Other Ambulatory Visit (HOSPITAL_BASED_OUTPATIENT_CLINIC_OR_DEPARTMENT_OTHER): Payer: Self-pay

## 2023-02-13 ENCOUNTER — Other Ambulatory Visit (HOSPITAL_BASED_OUTPATIENT_CLINIC_OR_DEPARTMENT_OTHER): Payer: Self-pay

## 2023-02-21 ENCOUNTER — Ambulatory Visit: Payer: 59

## 2023-02-21 DIAGNOSIS — I5020 Unspecified systolic (congestive) heart failure: Secondary | ICD-10-CM

## 2023-02-24 NOTE — Progress Notes (Deleted)
Subjective:   Paula Thompson, female    DOB: [birth year], 52 y.o.   MRN: [id number]   No chief complaint on file.    HPI  52 y.o. African-American female  with hypertension, nonischemic dilated cardiomyopathy  Patient has exertional dyspnea with more than usual activities, as well as occasional leg edema. She is currently not taking Corlanor, appears that insurance did not approve it.     Current Outpatient Medications:    atorvastatin (LIPITOR) 40 MG tablet, Take 1 tablet (40 mg total) by mouth daily., Disp: 90 tablet, Rfl: 3   dapagliflozin propanediol (FARXIGA) 10 MG TABS tablet, Take 1 tablet (10 mg total) by mouth daily., Disp: 90 tablet, Rfl: 3   diclofenac Sodium (VOLTAREN) 1 % GEL, Apply 4 g topically 4 (four) times daily., Disp: 100 g, Rfl: 11   fluconazole (DIFLUCAN) 150 MG tablet, Take one tablet by mouth at the first sign of symptoms of yeast. If no resolution, repeat dose in 72 hours., Disp: 2 tablet, Rfl: 4   furosemide (LASIX) 40 MG tablet, Take 1 tablet (40 mg total) by mouth daily., Disp: 90 tablet, Rfl: 3   ivabradine (CORLANOR) 7.5 MG TABS tablet, TAKE 1 TABLET (7.5 MG TOTAL) BY MOUTH 2 (TWO) TIMES DAILY WITH A MEAL., Disp: 180 tablet, Rfl: 3   metoprolol succinate (TOPROL-XL) 100 MG 24 hr tablet, Take 1 tablet (100 mg total) by mouth daily., Disp: 90 tablet, Rfl: 3   nystatin-triamcinolone ointment (MYCOLOG), Apply 1 Application topically 2 (two) times daily. Use very thin layer to area of itching., Disp: 30 g, Rfl: 2   pantoprazole (PROTONIX) 40 MG tablet, Take 1 tablet (40 mg total) by mouth daily. Take 40mg  twice a day for two weeks then cut down to 40mg  once a day., Disp: 90 tablet, Rfl: 3   sacubitril-valsartan (ENTRESTO) 49-51 MG, Take 1 tablet by mouth 2 (two) times daily., Disp: 180 tablet, Rfl: 3   spironolactone (ALDACTONE) 25 MG tablet, Take 1 tablet (25 mg total) by mouth daily., Disp: 90 tablet, Rfl: 3  Cardiovascular and other pertinent  studies:  EKG 12/26/2022: Sinus tachycardia 107 bpm  Diffuse nonspecific T-abnormality  Echocardiogram 02/21/2023: Left ventricle cavity is normal in size. Mild concentric hypertrophy of the left ventricle. Normal global wall motion. Normal LV systolic function with visual EF 50-55%. Doppler evidence of grade I (impaired) diastolic dysfunction, normal LAP.  No significant valvular abnormality. Normal right atrial pressure. Previous study on 11/02/2021 reported LVEF 40-45%.     LHC/RHC 07/25/2021: Tortuous vessels with no significant CAD   RA: 9 mmHg RV: 31/7 mmHg PA: 30/20 mmHg, mPAP 25 mmHg PCW: 14 mmHg   CO: 6.3 L/min CI: 2.8 L/min/m2   Mildly decompensated nonischemic cardiomyopathy  Echocardiogram 07/18/2021:  1. Left ventricular ejection fraction, by estimation, is <20%. The left  ventricle has severely decreased function. The left ventricle demonstrates  global hypokinesis. The left ventricular internal cavity size was severely  dilated. Left ventricular  diastolic function could not be evaluated.   2. Right ventricular systolic function is normal. The right ventricular  size is normal. Tricuspid regurgitation signal is inadequate for assessing  PA pressure.   3. A small pericardial effusion is present. The pericardial effusion is  circumferential. There is no evidence of cardiac tamponade.   4. The mitral valve is normal in structure. No evidence of mitral valve  regurgitation. No evidence of mitral stenosis.   5. The aortic valve is normal in structure. Aortic valve  regurgitation is  not visualized. No aortic stenosis is present.   6. The inferior vena cava is dilated in size with >50% respiratory  variability, suggesting right atrial pressure of 8 mmHg.    Recent labs: 10/01/2022: Glucose 173, BUN/Cr 9/0.61. EGFR 107. Na/K 140/4.4.  06/12/2022: Chol 164, TG 172, HDL 49, LDL 86  07/19/2021: Glucose 159, BUN/Cr 17/0.71. EGFR >60. Na/K 139/3.8. Rest of the CMP  normal H/H 13/42. MCV 74. Platelets 186 HbA1C 6.2% Chol 208, TG 159, HDL 40, LDL 139 TSH 1.9 normal    Review of Systems  Cardiovascular:  Positive for dyspnea on exertion and leg swelling. Negative for chest pain, palpitations and syncope.         There were no vitals filed for this visit.    There is no height or weight on file to calculate BMI. There were no vitals filed for this visit.    Objective:   Physical Exam Vitals and nursing note reviewed.  Constitutional:      General: She is not in acute distress.    Appearance: She is obese.  Neck:     Vascular: No JVD.  Cardiovascular:     Rate and Rhythm: Normal rate and regular rhythm.     Heart sounds: Normal heart sounds. No murmur heard. Pulmonary:     Effort: Pulmonary effort is normal.     Breath sounds: Normal breath sounds. No wheezing or rales.  Musculoskeletal:     Right lower leg: No edema.     Left lower leg: No edema.         Assessment & Recommendations:    52 y.o. African-American female  with hypertension, hyperlipidemia, nonischemic dilated cardiomyopathy  HFrEF: Nonischemic dilated cardiomyopathy.  EF <20%.  NYHA class II-III symptoms EF improved to 40-45% in 10/2021. Currently on Entresto to 49-51 bid, metoprolol succinate 50 mg daily, spironolactone 12.5 mg daily, Farxiga 10 mg daily.  Increase metoprolol succinate to 100 mg daily, spironolactone to 25 mg daily. Recommend low-salt diet Check BMP, proBNP in 1 week. Check echocardiogram in 4 weeks.  Mixed hyperlipidemia: Continue Lipitor 40 mg daily.  F/u in 6 weeks     Elder Negus, MD Pager: [phone] Office: [phone]

## 2023-02-25 ENCOUNTER — Ambulatory Visit: Payer: Commercial Managed Care - HMO | Admitting: Cardiology

## 2023-03-05 ENCOUNTER — Other Ambulatory Visit (HOSPITAL_BASED_OUTPATIENT_CLINIC_OR_DEPARTMENT_OTHER): Payer: Self-pay

## 2023-03-22 ENCOUNTER — Ambulatory Visit: Payer: Commercial Managed Care - HMO | Admitting: Cardiology

## 2023-03-29 ENCOUNTER — Other Ambulatory Visit (HOSPITAL_BASED_OUTPATIENT_CLINIC_OR_DEPARTMENT_OTHER): Payer: Self-pay

## 2023-04-02 ENCOUNTER — Other Ambulatory Visit (HOSPITAL_BASED_OUTPATIENT_CLINIC_OR_DEPARTMENT_OTHER): Payer: Self-pay

## 2023-04-06 ENCOUNTER — Other Ambulatory Visit (HOSPITAL_BASED_OUTPATIENT_CLINIC_OR_DEPARTMENT_OTHER): Payer: Self-pay

## 2023-04-22 ENCOUNTER — Other Ambulatory Visit (HOSPITAL_BASED_OUTPATIENT_CLINIC_OR_DEPARTMENT_OTHER): Payer: Self-pay

## 2023-04-25 ENCOUNTER — Ambulatory Visit: Payer: Commercial Managed Care - HMO | Admitting: Cardiology

## 2023-04-25 NOTE — Progress Notes (Signed)
Subjective:   Paula Thompson, female    DOB: Feb 24, 1971, 52 y.o.   MRN: [id number]   Chief Complaint  Patient presents with   heart failure with reduced ejection fraction   Follow-up     HPI  52 y.o. African-American female  with hypertension, nonischemic dilated cardiomyopathy  Patient has exertional dyspnea with more than usual activities, as well as occasional leg edema. She is currently not taking Corlanor, appears that insurance did not approve it.     Current Outpatient Medications:    atorvastatin (LIPITOR) 40 MG tablet, Take 1 tablet (40 mg total) by mouth daily., Disp: 90 tablet, Rfl: 3   dapagliflozin propanediol (FARXIGA) 10 MG TABS tablet, Take 1 tablet (10 mg total) by mouth daily., Disp: 90 tablet, Rfl: 3   diclofenac Sodium (VOLTAREN) 1 % GEL, Apply 4 g topically 4 (four) times daily., Disp: 100 g, Rfl: 11   fluconazole (DIFLUCAN) 150 MG tablet, Take one tablet by mouth at the first sign of symptoms of yeast. If no resolution, repeat dose in 72 hours., Disp: 2 tablet, Rfl: 4   furosemide (LASIX) 40 MG tablet, Take 1 tablet (40 mg total) by mouth daily., Disp: 90 tablet, Rfl: 3   ivabradine (CORLANOR) 7.5 MG TABS tablet, TAKE 1 TABLET (7.5 MG TOTAL) BY MOUTH 2 (TWO) TIMES DAILY WITH A MEAL., Disp: 180 tablet, Rfl: 3   metoprolol succinate (TOPROL-XL) 100 MG 24 hr tablet, Take 1 tablet (100 mg total) by mouth daily., Disp: 90 tablet, Rfl: 3   nystatin-triamcinolone ointment (MYCOLOG), Apply 1 Application topically 2 (two) times daily. Use very thin layer to area of itching., Disp: 30 g, Rfl: 2   pantoprazole (PROTONIX) 40 MG tablet, Take 1 tablet (40 mg total) by mouth daily. Take 40mg  twice a day for two weeks then cut down to 40mg  once a day., Disp: 90 tablet, Rfl: 3   sacubitril-valsartan (ENTRESTO) 49-51 MG, Take 1 tablet by mouth 2 (two) times daily., Disp: 180 tablet, Rfl: 3   spironolactone (ALDACTONE) 25 MG tablet, Take 1 tablet (25 mg total) by mouth  daily., Disp: 90 tablet, Rfl: 3  Cardiovascular and other pertinent studies:  EKG 12/26/2022: Sinus tachycardia 107 bpm  Diffuse nonspecific T-abnormality  Echocardiogram 02/21/2023:  Left ventricle cavity is normal in size. Mild concentric hypertrophy of  the left ventricle. Normal global wall motion. Normal LV systolic function  with visual EF 50-55%. Doppler evidence of grade I (impaired) diastolic  dysfunction, normal LAP.  No significant valvular abnormality.  Normal right atrial pressure.  Previous study on 11/02/2021 reported LVEF 40-45%.    LHC/RHC 07/25/2021: Tortuous vessels with no significant CAD   RA: 9 mmHg RV: 31/7 mmHg PA: 30/20 mmHg, mPAP 25 mmHg PCW: 14 mmHg   CO: 6.3 L/min CI: 2.8 L/min/m2   Mildly decompensated nonischemic cardiomyopathy  Echocardiogram 07/18/2021:  1. Left ventricular ejection fraction, by estimation, is <20%. The left  ventricle has severely decreased function. The left ventricle demonstrates  global hypokinesis. The left ventricular internal cavity size was severely  dilated. Left ventricular  diastolic function could not be evaluated.   2. Right ventricular systolic function is normal. The right ventricular  size is normal. Tricuspid regurgitation signal is inadequate for assessing  PA pressure.   3. A small pericardial effusion is present. The pericardial effusion is  circumferential. There is no evidence of cardiac tamponade.   4. The mitral valve is normal in structure. No evidence of mitral valve  regurgitation. No evidence of mitral stenosis.   5. The aortic valve is normal in structure. Aortic valve regurgitation is  not visualized. No aortic stenosis is present.   6. The inferior vena cava is dilated in size with >50% respiratory  variability, suggesting right atrial pressure of 8 mmHg.    Recent labs: 01/07/2023: NT proBNP 36 normal  10/01/2022: Glucose 173, BUN/Cr 9/0.61. EGFR 107. Na/K 140/4.4.  06/12/2022: Chol 164,  TG 172, HDL 49, LDL 86  07/19/2021: Glucose 159, BUN/Cr 17/0.71. EGFR >60. Na/K 139/3.8. Rest of the CMP normal H/H 13/42. MCV 74. Platelets 186 HbA1C 6.2% Chol 208, TG 159, HDL 40, LDL 139 TSH 1.9 normal    Review of Systems  Cardiovascular:  Positive for dyspnea on exertion and leg swelling. Negative for chest pain, palpitations and syncope.         There were no vitals filed for this visit.    There is no height or weight on file to calculate BMI. There were no vitals filed for this visit.    Objective:   Physical Exam Vitals and nursing note reviewed.  Constitutional:      General: She is not in acute distress.    Appearance: She is obese.  Neck:     Vascular: No JVD.  Cardiovascular:     Rate and Rhythm: Normal rate and regular rhythm.     Heart sounds: Normal heart sounds. No murmur heard. Pulmonary:     Effort: Pulmonary effort is normal.     Breath sounds: Normal breath sounds. No wheezing or rales.  Musculoskeletal:     Right lower leg: No edema.     Left lower leg: No edema.         Assessment & Recommendations:    52 y.o. African-American female  with hypertension, hyperlipidemia, nonischemic dilated cardiomyopathy  HFrEF: Nonischemic dilated cardiomyopathy.  EF <20%.  NYHA class II-III symptoms EF improved to 40-45% in 10/2021. Currently on Entresto to 49-51 bid, metoprolol succinate 50 mg daily, spironolactone 12.5 mg daily, Farxiga 10 mg daily.  Increase metoprolol succinate to 100 mg daily, spironolactone to 25 mg daily. Recommend low-salt diet Check BMP, proBNP in 1 week. Check echocardiogram in 4 weeks.  Mixed hyperlipidemia: Continue Lipitor 40 mg daily.  F/u in 6 weeks     Elder Negus, MD Pager: [phone] Office: [phone]

## 2023-04-26 ENCOUNTER — Other Ambulatory Visit (HOSPITAL_COMMUNITY): Payer: Self-pay

## 2023-04-26 ENCOUNTER — Other Ambulatory Visit (HOSPITAL_BASED_OUTPATIENT_CLINIC_OR_DEPARTMENT_OTHER): Payer: Self-pay

## 2023-05-08 ENCOUNTER — Other Ambulatory Visit (HOSPITAL_BASED_OUTPATIENT_CLINIC_OR_DEPARTMENT_OTHER): Payer: Self-pay | Admitting: Nurse Practitioner

## 2023-05-08 ENCOUNTER — Other Ambulatory Visit (HOSPITAL_BASED_OUTPATIENT_CLINIC_OR_DEPARTMENT_OTHER): Payer: Self-pay

## 2023-05-08 ENCOUNTER — Other Ambulatory Visit: Payer: Self-pay

## 2023-05-08 DIAGNOSIS — B3731 Acute candidiasis of vulva and vagina: Secondary | ICD-10-CM

## 2023-06-02 ENCOUNTER — Other Ambulatory Visit: Payer: Self-pay

## 2023-06-03 ENCOUNTER — Other Ambulatory Visit: Payer: Self-pay

## 2023-06-11 ENCOUNTER — Other Ambulatory Visit (HOSPITAL_BASED_OUTPATIENT_CLINIC_OR_DEPARTMENT_OTHER): Payer: Self-pay

## 2023-06-12 ENCOUNTER — Other Ambulatory Visit (HOSPITAL_BASED_OUTPATIENT_CLINIC_OR_DEPARTMENT_OTHER): Payer: Self-pay

## 2023-06-12 ENCOUNTER — Other Ambulatory Visit: Payer: Self-pay | Admitting: Student

## 2023-06-12 DIAGNOSIS — I5020 Unspecified systolic (congestive) heart failure: Secondary | ICD-10-CM

## 2023-06-12 MED ORDER — ENTRESTO 49-51 MG PO TABS
1.0000 | ORAL_TABLET | Freq: Two times a day (BID) | ORAL | 3 refills | Status: DC
  Filled 2023-06-12 – 2023-07-25 (×2): qty 60, 30d supply, fill #0
  Filled 2023-11-01 – 2023-11-12 (×2): qty 60, 30d supply, fill #1
  Filled 2024-01-01: qty 60, 30d supply, fill #2
  Filled 2024-02-04: qty 60, 30d supply, fill #3
  Filled 2024-05-07: qty 60, 30d supply, fill #4

## 2023-06-12 MED ORDER — ATORVASTATIN CALCIUM 40 MG PO TABS
40.0000 mg | ORAL_TABLET | Freq: Every day | ORAL | 3 refills | Status: DC
  Filled 2023-06-12 – 2023-07-25 (×2): qty 30, 30d supply, fill #0
  Filled 2023-10-01: qty 30, 30d supply, fill #1
  Filled 2023-11-11 – 2023-11-12 (×2): qty 30, 30d supply, fill #2
  Filled 2024-01-13: qty 30, 30d supply, fill #3
  Filled 2024-02-25 – 2024-05-07 (×2): qty 30, 30d supply, fill #4

## 2023-06-14 ENCOUNTER — Other Ambulatory Visit (HOSPITAL_BASED_OUTPATIENT_CLINIC_OR_DEPARTMENT_OTHER): Payer: Self-pay

## 2023-06-18 ENCOUNTER — Other Ambulatory Visit (HOSPITAL_BASED_OUTPATIENT_CLINIC_OR_DEPARTMENT_OTHER): Payer: Self-pay

## 2023-06-24 ENCOUNTER — Other Ambulatory Visit (HOSPITAL_BASED_OUTPATIENT_CLINIC_OR_DEPARTMENT_OTHER): Payer: Self-pay

## 2023-07-25 ENCOUNTER — Other Ambulatory Visit (HOSPITAL_BASED_OUTPATIENT_CLINIC_OR_DEPARTMENT_OTHER): Payer: Self-pay | Admitting: Nurse Practitioner

## 2023-07-25 ENCOUNTER — Other Ambulatory Visit (HOSPITAL_BASED_OUTPATIENT_CLINIC_OR_DEPARTMENT_OTHER): Payer: Self-pay

## 2023-07-25 DIAGNOSIS — K219 Gastro-esophageal reflux disease without esophagitis: Secondary | ICD-10-CM

## 2023-07-26 ENCOUNTER — Other Ambulatory Visit (HOSPITAL_BASED_OUTPATIENT_CLINIC_OR_DEPARTMENT_OTHER): Payer: Self-pay

## 2023-07-29 ENCOUNTER — Other Ambulatory Visit (HOSPITAL_BASED_OUTPATIENT_CLINIC_OR_DEPARTMENT_OTHER): Payer: Self-pay

## 2023-07-29 ENCOUNTER — Other Ambulatory Visit (HOSPITAL_BASED_OUTPATIENT_CLINIC_OR_DEPARTMENT_OTHER): Payer: Self-pay | Admitting: Nurse Practitioner

## 2023-07-29 ENCOUNTER — Other Ambulatory Visit: Payer: Self-pay

## 2023-07-29 DIAGNOSIS — K219 Gastro-esophageal reflux disease without esophagitis: Secondary | ICD-10-CM

## 2023-07-29 MED ORDER — PANTOPRAZOLE SODIUM 40 MG PO TBEC
40.0000 mg | DELAYED_RELEASE_TABLET | Freq: Every day | ORAL | 3 refills | Status: DC
  Filled 2023-07-29: qty 30, 30d supply, fill #0
  Filled 2023-09-04 – 2023-09-17 (×2): qty 30, 30d supply, fill #1
  Filled 2023-11-11 – 2023-11-12 (×2): qty 30, 30d supply, fill #2
  Filled 2024-01-01: qty 30, 30d supply, fill #3

## 2023-08-19 ENCOUNTER — Encounter: Payer: 59 | Admitting: Nurse Practitioner

## 2023-09-17 ENCOUNTER — Other Ambulatory Visit (HOSPITAL_BASED_OUTPATIENT_CLINIC_OR_DEPARTMENT_OTHER): Payer: Self-pay

## 2023-10-04 ENCOUNTER — Other Ambulatory Visit (HOSPITAL_BASED_OUTPATIENT_CLINIC_OR_DEPARTMENT_OTHER): Payer: Self-pay

## 2023-11-11 ENCOUNTER — Other Ambulatory Visit (HOSPITAL_COMMUNITY): Payer: Self-pay

## 2023-11-12 ENCOUNTER — Other Ambulatory Visit (HOSPITAL_BASED_OUTPATIENT_CLINIC_OR_DEPARTMENT_OTHER): Payer: Self-pay

## 2023-12-12 ENCOUNTER — Ambulatory Visit: Payer: Commercial Managed Care - HMO | Admitting: Nurse Practitioner

## 2023-12-12 ENCOUNTER — Encounter: Payer: Self-pay | Admitting: Nurse Practitioner

## 2023-12-12 ENCOUNTER — Other Ambulatory Visit (HOSPITAL_BASED_OUTPATIENT_CLINIC_OR_DEPARTMENT_OTHER): Payer: Self-pay

## 2023-12-12 VITALS — BP 136/88 | HR 104 | Wt 242.2 lb

## 2023-12-12 DIAGNOSIS — R739 Hyperglycemia, unspecified: Secondary | ICD-10-CM

## 2023-12-12 DIAGNOSIS — I509 Heart failure, unspecified: Secondary | ICD-10-CM

## 2023-12-12 DIAGNOSIS — I10 Essential (primary) hypertension: Secondary | ICD-10-CM | POA: Diagnosis not present

## 2023-12-12 DIAGNOSIS — Z23 Encounter for immunization: Secondary | ICD-10-CM

## 2023-12-12 DIAGNOSIS — E782 Mixed hyperlipidemia: Secondary | ICD-10-CM | POA: Diagnosis not present

## 2023-12-12 DIAGNOSIS — E1169 Type 2 diabetes mellitus with other specified complication: Secondary | ICD-10-CM | POA: Diagnosis not present

## 2023-12-12 DIAGNOSIS — I5020 Unspecified systolic (congestive) heart failure: Secondary | ICD-10-CM

## 2023-12-12 DIAGNOSIS — E559 Vitamin D deficiency, unspecified: Secondary | ICD-10-CM

## 2023-12-12 DIAGNOSIS — I428 Other cardiomyopathies: Secondary | ICD-10-CM

## 2023-12-12 LAB — LIPID PANEL

## 2023-12-12 MED ORDER — BLOOD GLUCOSE MONITOR SYSTEM W/DEVICE KIT
1.0000 | PACK | Freq: Three times a day (TID) | 0 refills | Status: DC
Start: 2023-12-12 — End: 2024-01-06
  Filled 2023-12-12: qty 1, 90d supply, fill #0

## 2023-12-12 MED ORDER — BLOOD GLUCOSE TEST VI STRP
ORAL_STRIP | 99 refills | Status: DC
Start: 2023-12-12 — End: 2024-01-06
  Filled 2023-12-12: qty 200, 50d supply, fill #0

## 2023-12-12 MED ORDER — JANUMET 50-500 MG PO TABS: ORAL_TABLET | ORAL | Status: DC

## 2023-12-12 MED ORDER — LANCET DEVICES MISC
99 refills | Status: DC
  Filled 2023-12-12: qty 200, fill #0

## 2023-12-12 MED ORDER — ONETOUCH DELICA PLUS LANCET33G MISC
99 refills | Status: DC
  Filled 2023-12-12: qty 200, 50d supply, fill #0

## 2023-12-12 NOTE — Patient Instructions (Addendum)
I have ordered a glucometer for you to check your blood sugars.  You can check once every morning before you eat to see where your blood sugar is to get an idea of how well your blood sugars are being controlled.   Diabetes Planning  I recommend trying to follow a diet with the following: Calories: 1200-1500 calories per day Carbohydrates: 150-180 grams of carbohydrates per day  Why: Gives your body enough "quick fuel" for cells to maintain normal function without sending them into starvation mode.  Protein: At least 90 grams of protein per day- 30 grams with each meal Why: Protein takes longer and uses more energy than carbohydrates to break down for fuel. The carbohydrates in your meals serves as quick energy sources and proteins help use some of that extra quick energy to break down to produce long term energy. This helps you not feel hungry as quickly and protein breakdown burns calories.  Water: Drink AT LEAST 64 ounces of water per day  Why: Water is essential to healthy metabolism. Water helps to fill the stomach and keep you fuller longer. Water is required for healthy digestion and filtering of waste in the body.  Fat: Limit fats in your diet- when choosing fats, choose foods with lower fats content such as lean meats (chicken, fish, [place]).  Why: Increased fat intake leads to storage "for later". Once you burn your carbohydrate energy, your body goes into fat and protein breakdown mode to help you loose weight.  Cholesterol: Fats and oils that are LIQUID at room temperature are best. Choose vegetable oils (olive oil, avocado oil, nuts). Avoid fats that are SOLID at room temperature (animal fats, processed meats). Healthy fats are often found in whole grains, beans, nuts, seeds, and berries.  Why: Elevated cholesterol levels lead to build up of cholesterol on the inside of your blood vessels. This will eventually cause the blood vessels to become hard and can lead to high blood pressure and  damage to your organs. When the blood flow is reduced, but the pressure is high from cholesterol buildup, parts of the cholesterol can break off and form clots that can go to the brain or heart leading to a stroke or heart attack.  Fiber: Increase amount of SOLUBLE the fiber in your diet. This helps to fill you up, lowers cholesterol, and helps with digestion. Some foods high in soluble fiber are oats, peas, beans, apples, carrots, barley, and citrus fruits.   Why: Fiber fills you up, helps remove excess cholesterol, and aids in healthy digestion which are all very important in weight management.   I recommend the following as a minimum activity routine: Purposeful walk or other physical activity at least 20 minutes every single day. This means purposefully taking a walk, jog, bike, swim, treadmill, elliptical, dance, etc.  This activity should be ABOVE your normal daily activities, such as walking at work. Goal exercise should be at least 150 minutes a week- work your way up to this.   Heart Rate: Your maximum exercise heart rate should be 220 - Your Age in Years. When exercising, get your heart rate up, but avoid going over the maximum targeted heart rate.  60-70% of your maximum heart rate is where you tend to burn the most fat. To find this number:  220 - Age In Years= Max HR  Max HR x 0.6 (or 0.7) = Fat Burning HR The Fat Burning HR is your goal heart rate while working out to burn the  most fat.  NEVER exercise to the point your feel lightheaded, weak, nauseated, dizzy. If you experience ANY of these symptoms- STOP exercise! Allow yourself to cool down and your heart rate to come down. Then restart slower next time.  If at ANY TIME you feel chest pain or chest pressure during exercise, STOP IMMEDIATELY and seek medical attention.     Diabetes Mellitus and Nutrition, Adult When you have diabetes, or diabetes mellitus, it is very important to have healthy eating habits because your blood  sugar (glucose) levels are greatly affected by what you eat and drink. Eating healthy foods in the right amounts, at about the same times every day, can help you: Manage your blood glucose. Lower your risk of heart disease. Improve your blood pressure. Reach or maintain a healthy weight. What can affect my meal plan? Every person with diabetes is different, and each person has different needs for a meal plan. Your health care provider may recommend that you work with a dietitian to make a meal plan that is best for you. Your meal plan may vary depending on factors such as: The calories you need. The medicines you take. Your weight. Your blood glucose, blood pressure, and cholesterol levels. Your activity level. Other health conditions you have, such as heart or kidney disease. How do carbohydrates affect me? Carbohydrates, also called carbs, affect your blood glucose level more than any other type of food. Eating carbs raises the amount of glucose in your blood. It is important to know how many carbs you can safely have in each meal. This is different for every person. Your dietitian can help you calculate how many carbs you should have at each meal and for each snack. How does alcohol affect me? Alcohol can cause a decrease in blood glucose (hypoglycemia), especially if you use insulin or take certain diabetes medicines by mouth. Hypoglycemia can be a life-threatening condition. Symptoms of hypoglycemia, such as sleepiness, dizziness, and confusion, are similar to symptoms of having too much alcohol. Do not drink alcohol if: Your health care provider tells you not to drink. You are pregnant, may be pregnant, or are planning to become pregnant. If you drink alcohol: Limit how much you have to: 0-1 drink a day for women. 0-2 drinks a day for men. Know how much alcohol is in your drink. In the [place] one drink equals one 12 oz bottle of beer (355 mL), one 5 oz glass of wine (148 mL), or one 1  oz glass of hard liquor (44 mL). Keep yourself hydrated with water, diet soda, or unsweetened iced tea. Keep in mind that regular soda, juice, and other mixers may contain a lot of sugar and must be counted as carbs. What are tips for following this plan?  Reading food labels Start by checking the serving size on the Nutrition Facts label of packaged foods and drinks. The number of calories and the amount of carbs, fats, and other nutrients listed on the label are based on one serving of the item. Many items contain more than one serving per package. Check the total grams (g) of carbs in one serving. Check the number of grams of saturated fats and trans fats in one serving. Choose foods that have a low amount or none of these fats. Check the number of milligrams (mg) of salt (sodium) in one serving. Most people should limit total sodium intake to less than 2,300 mg per day. Always check the nutrition information of foods labeled as "low-fat"  or "nonfat." These foods may be higher in added sugar or refined carbs and should be avoided. Talk to your dietitian to identify your daily goals for nutrients listed on the label. Shopping Avoid buying canned, pre-made, or processed foods. These foods tend to be high in fat, sodium, and added sugar. Shop around the outside edge of the grocery store. This is where you will most often find fresh fruits and vegetables, bulk grains, fresh meats, and fresh dairy products. Cooking Use low-heat cooking methods, such as baking, instead of high-heat cooking methods, such as deep frying. Cook using healthy oils, such as olive, canola, or sunflower oil. Avoid cooking with butter, cream, or high-fat meats. Meal planning Eat meals and snacks regularly, preferably at the same times every day. Avoid going long periods of time without eating. Eat foods that are high in fiber, such as fresh fruits, vegetables, beans, and whole grains. Eat 4-6 oz (112-168 g) of lean protein  each day, such as lean meat, chicken, fish, eggs, or tofu. One ounce (oz) (28 g) of lean protein is equal to: 1 oz (28 g) of meat, chicken, or fish. 1 egg.  cup (62 g) of tofu. Eat some foods each day that contain healthy fats, such as avocado, nuts, seeds, and fish. What foods should I eat? Fruits Berries. Apples. Oranges. Peaches. Apricots. Plums. Grapes. Mangoes. Papayas. Pomegranates. Kiwi. Cherries. Vegetables Leafy greens, including lettuce, spinach, kale, chard, collard greens, mustard greens, and cabbage. Beets. Cauliflower. Broccoli. Carrots. Green beans. Tomatoes. Peppers. Onions. Cucumbers. Brussels sprouts. Grains Whole grains, such as whole-wheat or whole-grain bread, crackers, tortillas, cereal, and pasta. Unsweetened oatmeal. Quinoa. Brown or wild rice. Meats and other proteins Seafood. Poultry without skin. Lean cuts of poultry and beef. Tofu. Nuts. Seeds. Dairy Low-fat or fat-free dairy products such as milk, yogurt, and cheese. The items listed above may not be a complete list of foods and beverages you can eat and drink. Contact a dietitian for more information. What foods should I avoid? Fruits Fruits canned with syrup. Vegetables Canned vegetables. Frozen vegetables with butter or cream sauce. Grains Refined white flour and flour products such as bread, pasta, snack foods, and cereals. Avoid all processed foods. Meats and other proteins Fatty cuts of meat. Poultry with skin. Breaded or fried meats. Processed meat. Avoid saturated fats. Dairy Full-fat yogurt, cheese, or milk. Beverages Sweetened drinks, such as soda or iced tea. The items listed above may not be a complete list of foods and beverages you should avoid. Contact a dietitian for more information. Questions to ask a health care provider Do I need to meet with a certified diabetes care and education specialist? Do I need to meet with a dietitian? What number can I call if I have questions? When are  the best times to check my blood glucose? Where to find more information: American Diabetes Association: diabetes.org Academy of Nutrition and Dietetics: eatright.[employer] of Diabetes and Digestive and Kidney Diseases: StageSync.si Association of Diabetes Care & Education Specialists: diabeteseducator.org Summary It is important to have healthy eating habits because your blood sugar (glucose) levels are greatly affected by what you eat and drink. It is important to use alcohol carefully. A healthy meal plan will help you manage your blood glucose and lower your risk of heart disease. Your health care provider may recommend that you work with a dietitian to make a meal plan that is best for you. This information is not intended to replace advice given to you by your  health care provider. Make sure you discuss any questions you have with your health care provider. Document Revised: 05/17/2020 Document Reviewed: 05/18/2020 Elsevier Patient Education  2024 [employer].

## 2023-12-12 NOTE — Progress Notes (Unsigned)
Paula Clamp, DNP, AGNP-c [facility]  [address] [place], [place] [address] [phone]  ESTABLISHED PATIENT- Chronic Health and/or Follow-Up Visit  Blood pressure 136/88, pulse (!) 104, weight 242 lb 3.2 oz (109.9 kg).    Paula Thompson is a 53 y.o. year old female presenting today for evaluation and management of chronic conditions.   History of Present Illness Paula Thompson is a 53 year old female who presents with elevated blood sugar levels and blurry vision.  She noticed elevated blood sugar levels during a recent dental visit, though she does not recall the exact value. The dental staff advised her to seek medical evaluation. She has not been on any medications for blood sugar control and has attempted dietary changes, such as reducing sugar intake and increasing water consumption.  She experiences blurry vision, described as 'a little blurry' but not severely so, which is concerning to her in the context of her elevated blood sugar levels.  She experiences occasional numbness or tingling in the tips of her fingers and sometimes increased thirst. No significant changes in hunger levels are noted, although she has not eaten anything today. She also reports issues with bowel movements, sometimes experiencing constipation.  She has a family history of diabetes, with her father having had diabetes and experiencing complications due to lack of management. This history increases her concern about her own health and the need for proper management.  Recent stressors, including her daughter's situation and her own job loss, have impacted her ability to focus on her health. She wants to manage her blood sugar levels effectively to avoid complications similar to those her father experienced.  All ROS negative with exception of what is listed above.   PHYSICAL EXAM Physical Exam Vitals and nursing note reviewed.  Constitutional:      Appearance: Normal  appearance.  HENT:     Head: Normocephalic.  Eyes:     Pupils: Pupils are equal, round, and reactive to light.  Cardiovascular:     Rate and Rhythm: Normal rate and regular rhythm.     Pulses: Normal pulses.     Heart sounds: Normal heart sounds.  Pulmonary:     Effort: Pulmonary effort is normal.     Breath sounds: Normal breath sounds.  Musculoskeletal:        General: Normal range of motion.     Cervical back: Normal range of motion.  Skin:    General: Skin is warm.  Neurological:     General: No focal deficit present.     Mental Status: She is alert and oriented to person, place, and time.  Psychiatric:        Mood and Affect: Mood normal.      PLAN Problem List Items Addressed This Visit     Diabetes mellitus (HCC)   Elevated blood glucose (~200 mg/dL) noted during a recent dental visit. Symptoms include blurry vision, occasional fingertip numbness, and increased thirst. Family history of diabetes. Patient attempting dietary changes. Discussed hereditary nature of diabetes, importance of diet and exercise, and role of carbohydrates, protein, and fiber in managing blood glucose. Emphasized portion control and balanced diet. Discussed GLP-1 receptor agonists (Ozempic, Mounjaro) for significant blood glucose reduction, weight loss, and improved cholesterol and blood pressure. Insurance may require trying other medications first. Provided information on metformin and sitagliptin, including potential gastrointestinal side effects. Emphasized gradual lifestyle changes and step-by-step diabetes management. - Order blood glucose test - Start sample medication containing metformin and sitagliptin: one  tablet at bedtime for one week, then increase to one tablet twice daily - Provide educational materials on diabetes management and dietary guidelines - Schedule follow-up appointment to review lab results and adjust treatment plan as needed      Relevant Medications    sitaGLIPtin-metformin (JANUMET) 50-500 MG tablet   Blood Glucose Monitoring Suppl (BLOOD GLUCOSE MONITOR SYSTEM) w/Device KIT   Glucose Blood (BLOOD GLUCOSE TEST STRIPS) STRP   Lancet Devices MISC   Lancets (ONETOUCH DELICA PLUS LANCET33G) MISC   Mixed hyperlipidemia   Relevant Orders   Hemoglobin A1c (Completed)   CBC with Differential/Platelet (Completed)   Comprehensive metabolic panel (Completed)   Lipid panel (Completed)   VITAMIN D 25 Hydroxy (Vit-D Deficiency, Fractures) (Completed)   CHF (congestive heart failure) (HCC)   Relevant Orders   Hemoglobin A1c (Completed)   CBC with Differential/Platelet (Completed)   Comprehensive metabolic panel (Completed)   Lipid panel (Completed)   VITAMIN D 25 Hydroxy (Vit-D Deficiency, Fractures) (Completed)   Primary hypertension   Relevant Orders   Hemoglobin A1c (Completed)   CBC with Differential/Platelet (Completed)   Comprehensive metabolic panel (Completed)   Lipid panel (Completed)   VITAMIN D 25 Hydroxy (Vit-D Deficiency, Fractures) (Completed)   HFrEF (heart failure with reduced ejection fraction) (HCC)   Relevant Orders   Hemoglobin A1c (Completed)   CBC with Differential/Platelet (Completed)   Comprehensive metabolic panel (Completed)   Lipid panel (Completed)   VITAMIN D 25 Hydroxy (Vit-D Deficiency, Fractures) (Completed)   Nonischemic cardiomyopathy (HCC)   Relevant Orders   Hemoglobin A1c (Completed)   CBC with Differential/Platelet (Completed)   Comprehensive metabolic panel (Completed)   Lipid panel (Completed)   VITAMIN D 25 Hydroxy (Vit-D Deficiency, Fractures) (Completed)   Other Visit Diagnoses       Hyperglycemia    -  Primary   Relevant Orders   Hemoglobin A1c (Completed)   CBC with Differential/Platelet (Completed)   Comprehensive metabolic panel (Completed)   Lipid panel (Completed)   VITAMIN D 25 Hydroxy (Vit-D Deficiency, Fractures) (Completed)     Need for influenza vaccination       Relevant  Orders   Flu vaccine trivalent PF, 6mos and older(Flulaval,Afluria,Fluarix,Fluzone) (Completed)     Need for COVID-19 vaccine       Relevant Orders   Pfizer Comirnaty Covid -19 Vaccine 55yrs and older (Completed)     General Health Maintenance Emphasized regular meals, balanced nutrition, and gradual adjustments to diet and exercise routines for blood glucose management. - Provide after-visit summary with dietary guidelines and carbohydrate intake recommendations - Encourage regular follow-up visits to monitor health status and adjust treatment as necessary  Follow-up - Schedule follow-up appointment after lab results are available.  Return in about 3 months (around 03/10/2024) for Med Management 45.  Paula Clamp, DNP, AGNP-c

## 2023-12-13 ENCOUNTER — Other Ambulatory Visit: Payer: Self-pay

## 2023-12-13 ENCOUNTER — Other Ambulatory Visit (HOSPITAL_BASED_OUTPATIENT_CLINIC_OR_DEPARTMENT_OTHER): Payer: Self-pay

## 2023-12-13 DIAGNOSIS — E119 Type 2 diabetes mellitus without complications: Secondary | ICD-10-CM | POA: Insufficient documentation

## 2023-12-13 LAB — COMPREHENSIVE METABOLIC PANEL
ALT: 25 IU/L (ref 0–32)
AST: 21 IU/L (ref 0–40)
Albumin: 4.2 g/dL (ref 3.8–4.9)
Alkaline Phosphatase: 119 IU/L (ref 44–121)
BUN/Creatinine Ratio: 20 (ref 9–23)
BUN: 10 mg/dL (ref 6–24)
Bilirubin Total: 0.7 mg/dL (ref 0.0–1.2)
CO2: 22 mmol/L (ref 20–29)
Calcium: 9.4 mg/dL (ref 8.7–10.2)
Chloride: 98 mmol/L (ref 96–106)
Creatinine, Ser: 0.5 mg/dL — ABNORMAL LOW (ref 0.57–1.00)
Globulin, Total: 3.2 g/dL (ref 1.5–4.5)
Glucose: 289 mg/dL — ABNORMAL HIGH (ref 70–99)
Potassium: 3.9 mmol/L (ref 3.5–5.2)
Sodium: 138 mmol/L (ref 134–144)
Total Protein: 7.4 g/dL (ref 6.0–8.5)
eGFR: 111 mL/min/{1.73_m2} (ref >59)

## 2023-12-13 LAB — CBC WITH DIFFERENTIAL/PLATELET
Basophils Absolute: 0 10*3/uL (ref 0.0–0.2)
Basos: 1 %
EOS (ABSOLUTE): 0.3 10*3/uL (ref 0.0–0.4)
Eos: 4 %
Hematocrit: 46.9 % — ABNORMAL HIGH (ref 34.0–46.6)
Hemoglobin: 15 g/dL (ref 11.1–15.9)
Immature Grans (Abs): 0.1 10*3/uL (ref 0.0–0.1)
Immature Granulocytes: 1 %
Lymphocytes Absolute: 2.7 10*3/uL (ref 0.7–3.1)
Lymphs: 31 %
MCH: 26.5 pg — ABNORMAL LOW (ref 26.6–33.0)
MCHC: 32 g/dL (ref 31.5–35.7)
MCV: 83 fL (ref 79–97)
Monocytes Absolute: 0.6 10*3/uL (ref 0.1–0.9)
Monocytes: 7 %
Neutrophils Absolute: 5.1 10*3/uL (ref 1.4–7.0)
Neutrophils: 56 %
Platelets: 173 10*3/uL (ref 150–450)
RBC: 5.65 x10E6/uL — ABNORMAL HIGH (ref 3.77–5.28)
RDW: 13.6 % (ref 11.7–15.4)
WBC: 8.7 10*3/uL (ref 3.4–10.8)

## 2023-12-13 LAB — LIPID PANEL
Cholesterol, Total: 193 mg/dL (ref 100–199)
HDL: 41 mg/dL (ref >39)
LDL CALC COMMENT:: 4.7 ratio — ABNORMAL HIGH (ref 0.0–4.4)
LDL Chol Calc (NIH): 104 mg/dL — ABNORMAL HIGH (ref 0–99)
Triglycerides: 284 mg/dL — ABNORMAL HIGH (ref 0–149)
VLDL Cholesterol Cal: 48 mg/dL — ABNORMAL HIGH (ref 5–40)

## 2023-12-13 LAB — HEMOGLOBIN A1C
Est. average glucose Bld gHb Est-mCnc: 263 mg/dL
Hgb A1c MFr Bld: 10.8 % — ABNORMAL HIGH (ref 4.8–5.6)

## 2023-12-13 LAB — VITAMIN D 25 HYDROXY (VIT D DEFICIENCY, FRACTURES): Vit D, 25-Hydroxy: 6.2 ng/mL — ABNORMAL LOW (ref 30.0–100.0)

## 2023-12-13 NOTE — Assessment & Plan Note (Signed)
Elevated blood glucose (~200 mg/dL) noted during a recent dental visit. Symptoms include blurry vision, occasional fingertip numbness, and increased thirst. Family history of diabetes. Patient attempting dietary changes. Discussed hereditary nature of diabetes, importance of diet and exercise, and role of carbohydrates, protein, and fiber in managing blood glucose. Emphasized portion control and balanced diet. Discussed GLP-1 receptor agonists (Ozempic, Mounjaro) for significant blood glucose reduction, weight loss, and improved cholesterol and blood pressure. Insurance may require trying other medications first. Provided information on metformin and sitagliptin, including potential gastrointestinal side effects. Emphasized gradual lifestyle changes and step-by-step diabetes management. - Order blood glucose test - Start sample medication containing metformin and sitagliptin: one tablet at bedtime for one week, then increase to one tablet twice daily - Provide educational materials on diabetes management and dietary guidelines - Schedule follow-up appointment to review lab results and adjust treatment plan as needed

## 2023-12-19 ENCOUNTER — Other Ambulatory Visit (HOSPITAL_BASED_OUTPATIENT_CLINIC_OR_DEPARTMENT_OTHER): Payer: Self-pay

## 2023-12-19 ENCOUNTER — Encounter: Payer: Self-pay | Admitting: Nurse Practitioner

## 2023-12-19 MED ORDER — VITAMIN D (ERGOCALCIFEROL) 1.25 MG (50000 UNIT) PO CAPS
50000.0000 [IU] | ORAL_CAPSULE | ORAL | 3 refills | Status: AC
Start: 2023-12-19 — End: ?
  Filled 2023-12-19 – 2024-01-01 (×2): qty 12, 84d supply, fill #0
  Filled 2024-01-13 – 2024-05-07 (×4): qty 12, 84d supply, fill #1
  Filled 2024-08-21: qty 12, 84d supply, fill #2

## 2023-12-19 NOTE — Addendum Note (Signed)
Addended by: Latonya Nelon, Huntley Dec E on: 12/19/2023 05:55 PM   Modules accepted: Orders

## 2023-12-23 ENCOUNTER — Other Ambulatory Visit (HOSPITAL_BASED_OUTPATIENT_CLINIC_OR_DEPARTMENT_OTHER): Payer: Self-pay

## 2024-01-01 ENCOUNTER — Other Ambulatory Visit: Payer: Self-pay

## 2024-01-01 ENCOUNTER — Other Ambulatory Visit: Payer: Self-pay | Admitting: Cardiology

## 2024-01-01 ENCOUNTER — Other Ambulatory Visit (HOSPITAL_BASED_OUTPATIENT_CLINIC_OR_DEPARTMENT_OTHER): Payer: Self-pay

## 2024-01-01 DIAGNOSIS — I509 Heart failure, unspecified: Secondary | ICD-10-CM

## 2024-01-01 DIAGNOSIS — I428 Other cardiomyopathies: Secondary | ICD-10-CM

## 2024-01-01 DIAGNOSIS — I5020 Unspecified systolic (congestive) heart failure: Secondary | ICD-10-CM

## 2024-01-02 ENCOUNTER — Other Ambulatory Visit: Payer: Self-pay

## 2024-01-02 ENCOUNTER — Other Ambulatory Visit (HOSPITAL_BASED_OUTPATIENT_CLINIC_OR_DEPARTMENT_OTHER): Payer: Self-pay

## 2024-01-02 MED ORDER — SPIRONOLACTONE 25 MG PO TABS
25.0000 mg | ORAL_TABLET | Freq: Every day | ORAL | 1 refills | Status: DC
  Filled 2024-01-02: qty 90, 90d supply, fill #0
  Filled 2024-05-07: qty 90, 90d supply, fill #1

## 2024-01-02 MED ORDER — DAPAGLIFLOZIN PROPANEDIOL 10 MG PO TABS
10.0000 mg | ORAL_TABLET | Freq: Every day | ORAL | 1 refills | Status: DC
  Filled 2024-01-02: qty 90, 90d supply, fill #0
  Filled 2024-05-07: qty 90, 90d supply, fill #1

## 2024-01-04 ENCOUNTER — Other Ambulatory Visit (HOSPITAL_BASED_OUTPATIENT_CLINIC_OR_DEPARTMENT_OTHER): Payer: Self-pay

## 2024-01-06 ENCOUNTER — Encounter: Payer: Self-pay | Admitting: Nurse Practitioner

## 2024-01-06 ENCOUNTER — Ambulatory Visit (INDEPENDENT_AMBULATORY_CARE_PROVIDER_SITE_OTHER): Payer: Commercial Managed Care - HMO | Admitting: Nurse Practitioner

## 2024-01-06 ENCOUNTER — Other Ambulatory Visit (HOSPITAL_BASED_OUTPATIENT_CLINIC_OR_DEPARTMENT_OTHER): Payer: Self-pay

## 2024-01-06 VITALS — BP 132/84 | HR 83 | Wt 241.8 lb

## 2024-01-06 DIAGNOSIS — E782 Mixed hyperlipidemia: Secondary | ICD-10-CM

## 2024-01-06 DIAGNOSIS — E1169 Type 2 diabetes mellitus with other specified complication: Secondary | ICD-10-CM | POA: Diagnosis not present

## 2024-01-06 DIAGNOSIS — I5020 Unspecified systolic (congestive) heart failure: Secondary | ICD-10-CM

## 2024-01-06 DIAGNOSIS — B3731 Acute candidiasis of vulva and vagina: Secondary | ICD-10-CM

## 2024-01-06 DIAGNOSIS — I509 Heart failure, unspecified: Secondary | ICD-10-CM

## 2024-01-06 DIAGNOSIS — I10 Essential (primary) hypertension: Secondary | ICD-10-CM

## 2024-01-06 DIAGNOSIS — K219 Gastro-esophageal reflux disease without esophagitis: Secondary | ICD-10-CM | POA: Diagnosis not present

## 2024-01-06 DIAGNOSIS — I428 Other cardiomyopathies: Secondary | ICD-10-CM

## 2024-01-06 MED ORDER — FLUCONAZOLE 150 MG PO TABS
150.0000 mg | ORAL_TABLET | ORAL | 1 refills | Status: DC
  Filled 2024-01-06: qty 4, 12d supply, fill #0
  Filled 2024-02-04: qty 4, 12d supply, fill #1

## 2024-01-06 MED ORDER — PANTOPRAZOLE SODIUM 40 MG PO TBEC
40.0000 mg | DELAYED_RELEASE_TABLET | Freq: Every day | ORAL | 3 refills | Status: AC
  Filled 2024-01-06 – 2024-02-14 (×3): qty 90, 90d supply, fill #0
  Filled 2024-05-20: qty 90, 90d supply, fill #1
  Filled 2024-10-25: qty 90, 90d supply, fill #2

## 2024-01-06 NOTE — Patient Instructions (Signed)
 I will get the new meter sent in as soon as I can find out what your insurance covers.   Let me know what your blood sugars are showing at home. I will send you the link to the email below to see what they are.   [email or web address]

## 2024-01-06 NOTE — Progress Notes (Unsigned)
  Shawna Clamp, DNP, AGNP-c [facility]  [address] [place], [place] [address] [phone]  ESTABLISHED PATIENT- Chronic Health and/or Follow-Up Visit  There were no vitals taken for this visit.    Paula Thompson is a 53 y.o. year old female presenting today for evaluation and management of chronic conditions: New diagnosis of diabetes.     All ROS negative with exception of what is listed above.   PHYSICAL EXAM Physical Exam   PLAN Problem List Items Addressed This Visit   None   No follow-ups on file.  Shawna Clamp, DNP, AGNP-c

## 2024-01-07 ENCOUNTER — Telehealth: Payer: Self-pay | Admitting: *Deleted

## 2024-01-07 NOTE — Assessment & Plan Note (Signed)
 Diabetes is poorly controlled, indicated by hyperglycemia and symptoms such as polydipsia and pruritus. She struggles with dietary modifications, particularly reducing soda intake. There is a deficit in knowledge for the management with diet as this is new to her. Unfortunately, not having a glucose monitor has contrinuted. A continuous glucose monitor was introduced to provide real-time feedback, aiding in dietary adjustments and preventing glycemic extremes. It is unlikely she will get coverage for this meter, but for educational purposes, I feel this is an exceptional tool for a motivated individual like herself.  - Provide a continuous glucose monitor [facility]) to track blood glucose levels. - Educate on dietary modifications, including reducing soda intake and maintaining a balanced diet. - Discuss the use of orange juice or hard candy for rapid glucose elevation if hypoglycemia symptoms occur. - Prescribe Janumet 50/500 mg twice daily. - Provide samples of Rybelsus 3 mg, with instructions to delay initiation until further notice. - Schedule follow-up in three months to reassess glycemic control.

## 2024-01-07 NOTE — Assessment & Plan Note (Signed)
 Recurrent yeast infections likely secondary to poorly controlled diabetes, causing pruritus. Emphasized the need for improved glucose control to prevent recurrence. - Prescribe Diflucan to manage symptoms.

## 2024-01-07 NOTE — Progress Notes (Signed)
 Care Guide Pharmacy Note  01/07/2024 Name: [name] MRN: [id number] DOB: 24-May-1971  Referred By: Tollie Eth, NP Reason for referral: Care Coordination (Initial outreach to schedule referral with PharmD )   Paula Thompson is a 53 y.o. year old female who is a primary care patient of Early, Sung Amabile, NP.  Haze Boyden was referred to the pharmacist for assistance related to: HLD and DMII  An unsuccessful telephone outreach was attempted today to contact the patient who was referred to the pharmacy team for assistance with Diease management . Additional attempts will be made to contact the patient.  Gwenevere Ghazi  [facility]  [facility], [facility] Guide  Direct Dial: [phone]  Fax [phone]

## 2024-01-09 NOTE — Progress Notes (Signed)
 Complex Care Management Note Care Guide Note  01/09/2024 Name: Paula Thompson MRN: [id number] DOB: 02/16/1971   Complex Care Management Outreach Attempts: A second unsuccessful outreach was attempted today to offer the patient with information about available complex care management services.  Follow Up Plan:  Additional outreach attempts will be made to offer the patient complex care management information and services.   Encounter Outcome:  No Answer  Gwenevere Ghazi  [facility]  [facility], [facility] Guide  Direct Dial: [phone]  Fax [phone]

## 2024-01-14 ENCOUNTER — Other Ambulatory Visit (HOSPITAL_BASED_OUTPATIENT_CLINIC_OR_DEPARTMENT_OTHER): Payer: Self-pay

## 2024-01-15 NOTE — Progress Notes (Signed)
 Care Guide Pharmacy Note  01/15/2024 Name: Paula Thompson MRN: [id number] DOB: 09/16/1971  Referred By: Tollie Eth, NP Reason for referral: Care Coordination (Initial outreach to schedule referral with PharmD )   Paula Thompson is a 53 y.o. year old female who is a primary care patient of Early, Sung Amabile, NP.  Haze Boyden was referred to the pharmacist for assistance related to: HTN and DMII  A third unsuccessful telephone outreach was attempted today to contact the patient who was referred to the pharmacy team for assistance with medication management. The Population Health team is pleased to engage with this patient at any time in the future upon receipt of referral and should he/she be interested in assistance from the Population Health team.  Gwenevere Ghazi  [facility]  Value-Based Care Institute, [facility] Guide  Direct Dial: [phone]  Fax [phone]

## 2024-01-20 ENCOUNTER — Other Ambulatory Visit (HOSPITAL_BASED_OUTPATIENT_CLINIC_OR_DEPARTMENT_OTHER): Payer: Self-pay

## 2024-02-04 ENCOUNTER — Other Ambulatory Visit (HOSPITAL_COMMUNITY): Payer: Self-pay

## 2024-02-04 ENCOUNTER — Other Ambulatory Visit (HOSPITAL_BASED_OUTPATIENT_CLINIC_OR_DEPARTMENT_OTHER): Payer: Self-pay

## 2024-02-04 ENCOUNTER — Other Ambulatory Visit: Payer: Self-pay

## 2024-02-04 ENCOUNTER — Telehealth: Payer: Self-pay

## 2024-02-04 ENCOUNTER — Other Ambulatory Visit: Payer: Self-pay | Admitting: Cardiology

## 2024-02-04 DIAGNOSIS — E1169 Type 2 diabetes mellitus with other specified complication: Secondary | ICD-10-CM

## 2024-02-04 MED ORDER — METOPROLOL SUCCINATE ER 100 MG PO TB24
100.0000 mg | ORAL_TABLET | Freq: Every day | ORAL | 0 refills | Status: DC
  Filled 2024-02-04: qty 90, 90d supply, fill #0

## 2024-02-04 MED ORDER — FREESTYLE LIBRE 3 PLUS SENSOR MISC
2 refills | Status: DC
  Filled 2024-02-04 – 2024-02-07 (×2): qty 2, 30d supply, fill #0

## 2024-02-04 NOTE — Telephone Encounter (Signed)
 Pharmacy Patient Advocate Encounter   Received notification from CoverMyMeds that prior authorization for FreeStyle Libre 3 Plus Sensor is required/requested.   Insurance verification completed.   The patient is insured through [employer] .   Per test claim: PA required; PA submitted to above mentioned insurance via CoverMyMeds Key/confirmation #/EOC  (Key: [id number])      Status is pending

## 2024-02-04 NOTE — Telephone Encounter (Signed)
 Freestyle libre 3 plus sent to the pts. Pharmacy on file and sent the pt. A MyChart message letting her know.   Copied from CRM [id number]. Topic: Clinical - Medical Advice >> Feb 04, 2024 10:07 AM Everette C wrote: Reason for CRM: The patient has been told by their Freestyle continuous glucose monitoring Sensor that there are 23 hours of monitoring remaining   The patient would like to know if they'll be prescribed/sent a new sensor prior to their appt on 03/26/24 and if they'll need to come pick one up from the practice  Please contact the patient further when possible to discuss and coordinate what should be done I their sensor stops working

## 2024-02-07 ENCOUNTER — Other Ambulatory Visit (HOSPITAL_BASED_OUTPATIENT_CLINIC_OR_DEPARTMENT_OTHER): Payer: Self-pay

## 2024-02-10 ENCOUNTER — Other Ambulatory Visit (HOSPITAL_BASED_OUTPATIENT_CLINIC_OR_DEPARTMENT_OTHER): Payer: Self-pay

## 2024-02-10 NOTE — Telephone Encounter (Signed)
 Pharmacy Patient Advocate Encounter  Received notification from [employer] that Prior Authorization for [facility] 3 has been DENIED.  Full denial letter will be uploaded to the media tab. See denial reason below.    PA #/Case ID/Reference #: [id number]

## 2024-02-13 ENCOUNTER — Other Ambulatory Visit (HOSPITAL_BASED_OUTPATIENT_CLINIC_OR_DEPARTMENT_OTHER): Payer: Self-pay

## 2024-02-13 MED ORDER — DEXCOM G7 RECEIVER DEVI
0 refills | Status: DC
  Filled 2024-02-13: qty 1, 90d supply, fill #0

## 2024-02-13 MED ORDER — DEXCOM G7 SENSOR MISC
11 refills | Status: DC
  Filled 2024-02-13: qty 3, 30d supply, fill #0
  Filled 2024-05-07 – 2024-05-20 (×2): qty 3, 30d supply, fill #1

## 2024-02-14 ENCOUNTER — Other Ambulatory Visit (HOSPITAL_BASED_OUTPATIENT_CLINIC_OR_DEPARTMENT_OTHER): Payer: Self-pay

## 2024-02-18 ENCOUNTER — Ambulatory Visit

## 2024-02-18 ENCOUNTER — Encounter: Admitting: Medical

## 2024-02-19 NOTE — Progress Notes (Signed)
 error

## 2024-02-20 ENCOUNTER — Other Ambulatory Visit (HOSPITAL_BASED_OUTPATIENT_CLINIC_OR_DEPARTMENT_OTHER): Payer: Self-pay

## 2024-02-24 ENCOUNTER — Other Ambulatory Visit (HOSPITAL_COMMUNITY): Payer: Self-pay

## 2024-02-25 ENCOUNTER — Other Ambulatory Visit: Payer: Self-pay

## 2024-02-27 ENCOUNTER — Other Ambulatory Visit (HOSPITAL_BASED_OUTPATIENT_CLINIC_OR_DEPARTMENT_OTHER): Payer: Self-pay

## 2024-02-28 ENCOUNTER — Ambulatory Visit (HOSPITAL_BASED_OUTPATIENT_CLINIC_OR_DEPARTMENT_OTHER): Admitting: Orthopaedic Surgery

## 2024-03-05 ENCOUNTER — Other Ambulatory Visit: Payer: Self-pay

## 2024-03-05 ENCOUNTER — Other Ambulatory Visit (HOSPITAL_BASED_OUTPATIENT_CLINIC_OR_DEPARTMENT_OTHER): Payer: Self-pay

## 2024-03-05 DIAGNOSIS — E1169 Type 2 diabetes mellitus with other specified complication: Secondary | ICD-10-CM

## 2024-03-05 MED ORDER — DEXCOM G7 RECEIVER DEVI
0 refills | Status: DC
  Filled 2024-03-05: qty 1, fill #0
  Filled 2024-03-10: qty 1, 28d supply, fill #0

## 2024-03-06 ENCOUNTER — Other Ambulatory Visit (HOSPITAL_BASED_OUTPATIENT_CLINIC_OR_DEPARTMENT_OTHER): Payer: Self-pay

## 2024-03-10 ENCOUNTER — Other Ambulatory Visit (HOSPITAL_BASED_OUTPATIENT_CLINIC_OR_DEPARTMENT_OTHER): Payer: Self-pay

## 2024-03-26 ENCOUNTER — Encounter: Payer: Commercial Managed Care - HMO | Admitting: Nurse Practitioner

## 2024-04-09 ENCOUNTER — Ambulatory Visit: Admitting: Nurse Practitioner

## 2024-05-07 ENCOUNTER — Other Ambulatory Visit: Payer: Self-pay | Admitting: Cardiology

## 2024-05-07 ENCOUNTER — Other Ambulatory Visit: Payer: Self-pay

## 2024-05-08 ENCOUNTER — Other Ambulatory Visit (HOSPITAL_BASED_OUTPATIENT_CLINIC_OR_DEPARTMENT_OTHER): Payer: Self-pay

## 2024-05-08 MED ORDER — METOPROLOL SUCCINATE ER 100 MG PO TB24
100.0000 mg | ORAL_TABLET | Freq: Every day | ORAL | 0 refills | Status: DC
  Filled 2024-05-08: qty 30, 30d supply, fill #0

## 2024-05-12 ENCOUNTER — Other Ambulatory Visit (HOSPITAL_COMMUNITY): Payer: Self-pay

## 2024-05-12 ENCOUNTER — Telehealth: Payer: Self-pay

## 2024-05-12 ENCOUNTER — Encounter: Payer: Self-pay | Admitting: Pharmacy Technician

## 2024-05-12 NOTE — Telephone Encounter (Signed)
 Pharmacy Patient Advocate Encounter   Received notification from Onbase that prior authorization for Dexcom G7 sensor is required/requested.   Insurance verification completed.   The patient is insured through [employer] .   Per test claim: PA required; PA submitted to above mentioned insurance via LATENT Key/confirmation #/EOC [id number] Status is pending

## 2024-05-12 NOTE — Telephone Encounter (Signed)
 Please note that Insurance has given the pt a Temp 90 day supply back in 01/2024 and now the Rx will require a P/A which has now been submitted      Pharmacy Patient Advocate Encounter   Received notification from CoverMyMeds that prior authorization for Dexcom G7 Sensor is required/requested.   Insurance verification completed.   The patient is insured through [employer].   Per test claim: PA required; PA submitted to above mentioned insurance via CoverMyMeds Key/confirmation #/EOC (Key: [id number])   Status is pending

## 2024-05-12 NOTE — Telephone Encounter (Signed)
 ERROR

## 2024-05-15 ENCOUNTER — Other Ambulatory Visit: Payer: Self-pay

## 2024-05-15 ENCOUNTER — Other Ambulatory Visit (HOSPITAL_BASED_OUTPATIENT_CLINIC_OR_DEPARTMENT_OTHER): Payer: Self-pay

## 2024-05-19 NOTE — Telephone Encounter (Signed)
 Pharmacy Patient Advocate Encounter  Received notification from [facility] MEDICARE that Prior Authorization for Dexcom G7 Sensor has been DENIED.  Full denial letter will be uploaded to the media tab. See denial reason below.   PA #/Case ID/Reference #: [id number]

## 2024-05-21 ENCOUNTER — Other Ambulatory Visit (HOSPITAL_BASED_OUTPATIENT_CLINIC_OR_DEPARTMENT_OTHER): Payer: Self-pay

## 2024-05-21 ENCOUNTER — Telehealth: Payer: Self-pay

## 2024-05-21 ENCOUNTER — Other Ambulatory Visit: Payer: Self-pay

## 2024-05-21 MED ORDER — BLOOD GLUCOSE TEST VI STRP
1.0000 | ORAL_STRIP | Freq: Three times a day (TID) | 3 refills | Status: AC
  Filled 2024-05-21: qty 100, 33d supply, fill #0
  Filled 2024-09-08: qty 100, 33d supply, fill #1

## 2024-05-21 MED ORDER — ACCU-CHEK SOFTCLIX LANCETS MISC
1.0000 | Freq: Three times a day (TID) | 3 refills | Status: AC
  Filled 2024-05-21: qty 100, 33d supply, fill #0

## 2024-05-21 MED ORDER — BLOOD GLUCOSE MONITOR SYSTEM W/DEVICE KIT
1.0000 | PACK | Freq: Three times a day (TID) | 0 refills | Status: AC
  Filled 2024-05-21: qty 1, 30d supply, fill #0

## 2024-05-21 MED ORDER — LANCET DEVICE MISC
1.0000 | Freq: Three times a day (TID) | 0 refills | Status: AC
  Filled 2024-05-21: qty 1, 30d supply, fill #0

## 2024-05-21 NOTE — Telephone Encounter (Signed)
 I have already sent in a glucometer and all the supplies for the pt. Sent message through MyChart as well.   Copied from CRM [id number]. Topic: Clinical - Medication Question >> May 21, 2024  9:52 AM Myrick T wrote: Reason for CRM: patient called said she has her last sensor on but once it run out she will be in need of a regular glucometer. Please f/u with patient

## 2024-07-06 ENCOUNTER — Other Ambulatory Visit (HOSPITAL_BASED_OUTPATIENT_CLINIC_OR_DEPARTMENT_OTHER): Payer: Self-pay

## 2024-07-06 ENCOUNTER — Other Ambulatory Visit: Payer: Self-pay | Admitting: Cardiology

## 2024-07-06 DIAGNOSIS — I5020 Unspecified systolic (congestive) heart failure: Secondary | ICD-10-CM

## 2024-07-06 MED ORDER — ATORVASTATIN CALCIUM 40 MG PO TABS
40.0000 mg | ORAL_TABLET | Freq: Every day | ORAL | 0 refills | Status: DC
  Filled 2024-07-06: qty 30, 30d supply, fill #0

## 2024-07-06 MED ORDER — FUROSEMIDE 40 MG PO TABS
40.0000 mg | ORAL_TABLET | Freq: Every day | ORAL | 0 refills | Status: DC
Start: 2024-07-06 — End: 2024-08-07
  Filled 2024-07-06: qty 30, 30d supply, fill #0

## 2024-07-06 MED ORDER — SACUBITRIL-VALSARTAN 49-51 MG PO TABS
1.0000 | ORAL_TABLET | Freq: Two times a day (BID) | ORAL | 0 refills | Status: DC
  Filled 2024-07-06: qty 60, 30d supply, fill #0

## 2024-07-07 ENCOUNTER — Telehealth: Payer: Self-pay | Admitting: Nurse Practitioner

## 2024-07-07 NOTE — Telephone Encounter (Signed)
 Patient was identified as falling into the [place] Measure - Diabetes.   Patient was: Left voicemail to schedule with primary care provider.   Pt has Welcome to Medicare scheduled 08/07/24 but need Diabetes check Asap, left message for pt, possible appt 07/15/24

## 2024-07-14 ENCOUNTER — Other Ambulatory Visit (HOSPITAL_BASED_OUTPATIENT_CLINIC_OR_DEPARTMENT_OTHER): Payer: Self-pay

## 2024-08-07 ENCOUNTER — Other Ambulatory Visit (HOSPITAL_BASED_OUTPATIENT_CLINIC_OR_DEPARTMENT_OTHER): Payer: Self-pay

## 2024-08-07 ENCOUNTER — Other Ambulatory Visit: Payer: Self-pay

## 2024-08-07 ENCOUNTER — Encounter: Payer: Self-pay | Admitting: Nurse Practitioner

## 2024-08-07 ENCOUNTER — Other Ambulatory Visit: Payer: Self-pay | Admitting: Cardiology

## 2024-08-07 ENCOUNTER — Ambulatory Visit: Admitting: Nurse Practitioner

## 2024-08-07 VITALS — BP 128/84 | HR 78 | Ht 64.0 in | Wt 237.2 lb

## 2024-08-07 DIAGNOSIS — I10 Essential (primary) hypertension: Secondary | ICD-10-CM

## 2024-08-07 DIAGNOSIS — I428 Other cardiomyopathies: Secondary | ICD-10-CM

## 2024-08-07 DIAGNOSIS — J0140 Acute pansinusitis, unspecified: Secondary | ICD-10-CM | POA: Diagnosis not present

## 2024-08-07 DIAGNOSIS — I5020 Unspecified systolic (congestive) heart failure: Secondary | ICD-10-CM

## 2024-08-07 DIAGNOSIS — Z23 Encounter for immunization: Secondary | ICD-10-CM | POA: Diagnosis not present

## 2024-08-07 DIAGNOSIS — Z1211 Encounter for screening for malignant neoplasm of colon: Secondary | ICD-10-CM

## 2024-08-07 DIAGNOSIS — E1169 Type 2 diabetes mellitus with other specified complication: Secondary | ICD-10-CM | POA: Diagnosis not present

## 2024-08-07 DIAGNOSIS — Z0000 Encounter for general adult medical examination without abnormal findings: Secondary | ICD-10-CM | POA: Diagnosis not present

## 2024-08-07 DIAGNOSIS — I509 Heart failure, unspecified: Secondary | ICD-10-CM

## 2024-08-07 DIAGNOSIS — Z1231 Encounter for screening mammogram for malignant neoplasm of breast: Secondary | ICD-10-CM

## 2024-08-07 DIAGNOSIS — E782 Mixed hyperlipidemia: Secondary | ICD-10-CM

## 2024-08-07 DIAGNOSIS — K219 Gastro-esophageal reflux disease without esophagitis: Secondary | ICD-10-CM

## 2024-08-07 LAB — LIPID PANEL

## 2024-08-07 MED ORDER — ATORVASTATIN CALCIUM 40 MG PO TABS
40.0000 mg | ORAL_TABLET | Freq: Every day | ORAL | 0 refills | Status: DC
  Filled 2024-08-07: qty 30, 30d supply, fill #0

## 2024-08-07 MED ORDER — DAPAGLIFLOZIN PROPANEDIOL 10 MG PO TABS
10.0000 mg | ORAL_TABLET | Freq: Every day | ORAL | 1 refills | Status: DC
  Filled 2024-08-07: qty 90, 90d supply, fill #0

## 2024-08-07 MED ORDER — DOXYCYCLINE HYCLATE 100 MG PO TABS
100.0000 mg | ORAL_TABLET | Freq: Two times a day (BID) | ORAL | 0 refills | Status: DC
  Filled 2024-08-07: qty 14, 7d supply, fill #0

## 2024-08-07 MED ORDER — AZELASTINE HCL 0.1 % NA SOLN
2.0000 | Freq: Two times a day (BID) | NASAL | 12 refills | Status: AC
  Filled 2024-08-07: qty 30, 50d supply, fill #0
  Filled 2024-09-08 – 2024-10-25 (×2): qty 30, 50d supply, fill #1

## 2024-08-07 MED ORDER — FUROSEMIDE 40 MG PO TABS
40.0000 mg | ORAL_TABLET | Freq: Every day | ORAL | 0 refills | Status: DC
  Filled 2024-08-07: qty 30, 30d supply, fill #0

## 2024-08-07 MED ORDER — METOPROLOL SUCCINATE ER 100 MG PO TB24
100.0000 mg | ORAL_TABLET | Freq: Every day | ORAL | 0 refills | Status: DC
  Filled 2024-08-07: qty 30, 30d supply, fill #0

## 2024-08-07 MED ORDER — SPIRONOLACTONE 25 MG PO TABS
25.0000 mg | ORAL_TABLET | Freq: Every day | ORAL | 1 refills | Status: DC
  Filled 2024-08-07: qty 90, 90d supply, fill #0

## 2024-08-07 MED ORDER — FLUCONAZOLE 150 MG PO TABS
150.0000 mg | ORAL_TABLET | Freq: Once | ORAL | 2 refills | Status: AC
  Filled 2024-08-07: qty 2, 2d supply, fill #0

## 2024-08-07 MED ORDER — SACUBITRIL-VALSARTAN 49-51 MG PO TABS
1.0000 | ORAL_TABLET | Freq: Two times a day (BID) | ORAL | 0 refills | Status: DC
  Filled 2024-08-07: qty 60, 30d supply, fill #0

## 2024-08-07 NOTE — Patient Instructions (Addendum)
 Let me know  if you are still feeling bad after 2 weeks. You should start to feeling better very soon.   I will be in touch with you once I get your labs back and will let you know if any changes are needed.   Please call to schedule your appointment with cardiology as soon as possible.   I sent an order in for breast cancer screening. The breast center will call you to schedule this.   I sent an order in for colon cancer screening with cologuard. They will mail you a box with instructions on how to complete the screening. This is only completed every 3 years. If there are concerns they will send you a letter and also let me know.    For all adult patients, I recommend A well balanced diet low in saturated fats, cholesterol, and moderation in carbohydrates.   This can be as simple as monitoring portion sizes and cutting back on sugary beverages such as soda and juice to start with.    Daily water consumption of at least 64 ounces.  Physical activity at least 180 minutes per week, if just starting out.   This can be as simple as taking the stairs instead of the elevator and walking 2-3 laps around the office  purposefully every day.   STD protection, partner selection, and regular testing if high risk.  Limited consumption of alcoholic beverages if alcohol is consumed.  For women, I recommend no more than 7 alcoholic beverages per week, spread out throughout the week.  Avoid binge drinking or consuming large quantities of alcohol in one setting.   Please let me know if you feel you may need help with reduction or quitting alcohol consumption.   Avoidance of nicotine, if used.  Please let me know if you feel you may need help with reduction or quitting nicotine use.   Daily mental health attention.  This can be in the form of 5 minute daily meditation, prayer, journaling, yoga, reflection, etc.   Purposeful attention to your emotions and mental state can significantly improve your  overall wellbeing  and  Health.  Please know that I am here to help you with all of your health care goals and am happy to work with you to find a solution that works best for you.  The greatest advice I have received with any changes in life are to take it one step at a time, that even means if all you can focus on is the next 60 seconds, then do that and celebrate your victories.  With any changes in life, you will have set backs, and that is OK. The important thing to remember is, if you have a set back, it is not a failure, it is an opportunity to try again!  Health Maintenance Recommendations Screening Testing Mammogram Every 1 -2 years based on history and risk factors Starting at age 81 Pap Smear Ages 21-39 every 3 years Ages 4-65 every 5 years with HPV testing More frequent testing may be required based on results and history Colon Cancer Screening Every 1-10 years based on test performed, risk factors, and history Starting at age 32 Bone Density Screening Every 2-10 years based on history Starting at age 3 for women Recommendations for men differ based on medication usage, history, and risk factors AAA Screening One time ultrasound Men 40-73 years old who have every smoked Lung Cancer Screening Low Dose Lung CT every 12 months Age 33-80 years with  a 30 pack-year smoking history who still smoke or who have quit within the last 15 years  Screening Labs Routine  Labs: Complete Blood Count (CBC), Complete Metabolic Panel (CMP), Cholesterol (Lipid Panel) Every 6-12 months based on history and medications May be recommended more frequently based on current conditions or previous results Hemoglobin A1c Lab Every 3-12 months based on history and previous results Starting at age 54 or earlier with diagnosis of diabetes, high cholesterol, BMI >26, and/or risk factors Frequent monitoring for patients with diabetes to ensure blood sugar control Thyroid Panel (TSH w/ T3 &  T4) Every 6 months based on history, symptoms, and risk factors May be repeated more often if on medication HIV One time testing for all patients 26 and older May be repeated more frequently for patients with increased risk factors or exposure Hepatitis C One time testing for all patients 17 and older May be repeated more frequently for patients with increased risk factors or exposure Gonorrhea, Chlamydia Every 12 months for all sexually active persons 13-24 years Additional monitoring may be recommended for those who are considered high risk or who have symptoms PSA Men 103-36 years old with risk factors Additional screening may be recommended from age 76-69 based on risk factors, symptoms, and history  Vaccine Recommendations Tetanus Booster All adults every 10 years Flu Vaccine All patients 6 months and older every year COVID Vaccine All patients 12 years and older Initial dosing with booster May recommend additional booster based on age and health history HPV Vaccine 2 doses all patients age 33-26 Dosing may be considered for patients over 26 Shingles Vaccine (Shingrix) 2 doses all adults 55 years and older Pneumonia (Pneumovax 23) All adults 65 years and older May recommend earlier dosing based on health history Pneumonia (Prevnar 69) All adults 65 years and older Dosed 1 year after Pneumovax 23  Additional Screening, Testing, and Vaccinations may be recommended on an individualized basis based on family history, health history, risk factors, and/or exposure.

## 2024-08-07 NOTE — Progress Notes (Signed)
 08/07/2024   Vitals:  BP 128/84   Pulse 78   Ht 5' 4 (1.626 m)   Wt 237 lb 3.2 oz (107.6 kg)   SpO2 98%   BMI 40.72 kg/m   Body mass index is 40.72 kg/m. Paula Thompson is a 53 y.o. female who presents for Initial Medicare Annual Wellness Exam  Care Team Members: Current Providers as of 08/07/2024 PCP: Oris Camie BRAVO, NP Encounter Provider: Oris Camie BRAVO, NP, starting on Fri Aug 07, 2024 12:00 AM Referring Provider: Oris Camie BRAVO, NP, starting on Fri Aug 07, 2024 12:00 AM Attending Provider: Oris Camie BRAVO, NP, starting on Mon Jul 06, 2024  3:50 PM (Active) Nurse Practitioner: Oris Camie BRAVO, NP, starting on Fri Aug 07, 2024  2:30 PM (Active)   Method of visit:  in person In the event virtual visit conducted, the patient consented to a virtual visit. Patient consented to have virtual visit and was identified by two identifiers.  Encounter participants: Patient: Paula Thompson - located AWV Patient Visit Location: In Office Nurse/Provider: Camie CHARLENA Oris - located Virtual Visit Location Provider: Office/Clinic Others (if applicable): patient only   Review of Systems:  Neuro: Denies difficulty remembering daily tasks, people, or places.  Ear: Denies difficulty hearing or need to increase volume on television or telephone to hear Eye: Denies visual changes, difficulty reading normal print, or visual field deficits. Cardiac: Denies chest pain, palpitations, dizziness, shortness of breath, pain in lower extremities, or night time waking with shortness of breath. Lung: Denies shortness of breath, difficulty breathing, chronic cough, or dizziness.  GI: Denies changes in bowel habits, blood in stool, difficulty passing stool, decreased intake of food or drink, nausea, or vomiting.  GU: Denies changes in urinary habits, dark urine, malodorous urine, increased or decreased urination, or urinary incontinence.  MSK: Denies weakness in extremities, difficulty walking, difficulty grasping,  or new MSK pain.  Skin: Denies changes to the skin, fragile skin, or increased bruising.  Constitution: Denies fatigue, weakness, or confusion.   Patient rating of health: same as this time last year  Clinical Intake:    Pain : 0-10 Pain Score: 7  Pain Location: Knee Pain Orientation: Right     Diabetes: Yes  Activities of Daily Living: Independent Ambulation: Independent Medication Administration: Independent Home Management: Independent     Do you feel unsafe in your current relationship?: No Do you feel physically threatened by others?: No Anyone hurting you at home, work, or school?: No Unable to ask?: No  How often do you need to have someone help you when you read instructions, pamphlets, or other written materials from your doctor or pharmacy?: 1 - Never  Interpreter Needed?: No        08/07/2024    2:41 PM 02/12/2022    8:10 AM 07/25/2021    7:58 AM 07/17/2021    3:32 PM 02/01/2017   11:42 AM 02/22/2016    9:40 AM 12/30/2014    7:51 AM  Advanced Directives  Does Patient Have a Medical Advance Directive? Yes No No No Yes  No  No   Type of Estate agent of [place];Living will        Does patient want to make changes to medical advance directive? Yes (Inpatient - patient defers changing a medical advance directive and declines information at this time)        Copy of Healthcare Power of Attorney in Chart? No - copy requested  Would patient like information on creating a medical advance directive?  No - Patient declined Yes (MAU/Ambulatory/Procedural Areas - Information given) No - Patient declined  No - patient declined information  No - patient declined information      Data saved with a previous flowsheet row definition    Social Determinants of Health SDOH Screenings   Depression (PHQ2-9): Low Risk  (08/07/2024)  Social Connections: Unknown (03/13/2022)   Received from [facility]  Tobacco Use: High Risk (08/07/2024)      Functional Status Survey: Is the patient deaf or have difficulty hearing?: No Does the patient have difficulty seeing, even when wearing glasses/contacts?: Yes Does the patient have difficulty concentrating, remembering, or making decisions?: No Does the patient have difficulty walking or climbing stairs?: Yes Does the patient have difficulty dressing or bathing?: No Does the patient have difficulty doing errands alone such as visiting a doctor's office or shopping?: No   Annual Goal:  Goals      Increase physical activity     Eat better food choices          Advanced Directives <no information>     Fall Risk    08/07/2024    2:43 PM 01/06/2024   11:05 AM 12/12/2023   12:05 PM 12/21/2021    7:47 PM  Fall Risk   Falls in the past year? 0 0 0 0  Number falls in past yr: 0 0 0 0  Injury with Fall? 0 0 0 0  Risk for fall due to : No Fall Risks  No Fall Risks No Fall Risks  Follow up Falls evaluation completed  Falls evaluation completed Falls evaluation completed      Data saved with a previous flowsheet row definition   Medicare Risk     Cognitive Function Normal: Yes Exam Completed:         08/07/2024    2:43 PM  6CIT Screen  What Year? 0 points  What month? 0 points  What time? 0 points  Count back from 20 0 points  Months in reverse 0 points  Repeat phrase 0 points  Total Score 0 points     Depression Screening    08/07/2024    2:43 PM 01/06/2024   11:05 AM 12/12/2023   12:05 PM 12/21/2021    7:47 PM  Depression screen PHQ 2/9  Decreased Interest 0 0 0 2  Down, Depressed, Hopeless 0 0 0 1  PHQ - 2 Score 0 0 0 3  Altered sleeping    1  Tired, decreased energy    1  Feeling bad or failure about yourself     1  Trouble concentrating    0  Moving slowly or fidgety/restless    0  Suicidal thoughts    0  PHQ-9 Score    6  Difficult doing work/chores    Not difficult at all     Activities of Daily Living    08/07/2024    2:38 PM  In your  present state of health, do you have any difficulty performing the following activities:  Hearing? 0  Vision? 1  Difficulty concentrating or making decisions? 0  Walking or climbing stairs? 1  Dressing or bathing? 0  Doing errands, shopping? 0  Preparing Food and eating ? N  Using the Toilet? N  In the past six months, have you accidently leaked urine? Y  Do you have problems with loss of bowel control? N  Managing your Medications? Y  Managing your Finances? Y  Housekeeping or managing your Housekeeping? Y    Tobacco Social History   Tobacco Use  Smoking Status Some Days   Current packs/day: 0.25   Types: Cigarettes  Smokeless Tobacco Never     Ready to quit: Not Answered Counseling given: Not Answered   Hospitalizations in the Past Year: none  ED Visits in the Past Year: No  Surgeries in the Past Year: No   History    Medication List Current Meds  Medication Sig   azelastine (ASTELIN) 0.1 % nasal spray Place 2 sprays into both nostrils 2 (two) times daily. Use in each nostril as directed   Blood Glucose Monitoring Suppl (BLOOD GLUCOSE MONITOR SYSTEM) w/Device KIT 1 each by Does not apply route in the morning, at noon, and at bedtime. May substitute to any manufacturer covered by patient's insurance.   diclofenac  Sodium (VOLTAREN ) 1 % GEL Apply 4 g topically 4 (four) times daily.   doxycycline (VIBRA-TABS) 100 MG tablet Take 1 tablet (100 mg total) by mouth 2 (two) times daily.   [EXPIRED] fluconazole  (DIFLUCAN ) 150 MG tablet Take 1 tablet (150 mg total) by mouth once for 1 dose. May repeat in 3 days if symptoms not resolved   Glucose Blood (BLOOD GLUCOSE TEST STRIPS) STRP Test blood sugar 3 times daily   pantoprazole  (PROTONIX ) 40 MG tablet Take 1 tablet (40 mg total) by mouth daily.   Vitamin D , Ergocalciferol , (DRISDOL ) 1.25 MG (50000 UNIT) CAPS capsule Take 1 capsule (50,000 Units total) by mouth every 7 (seven) days.   [DISCONTINUED] atorvastatin  (LIPITOR) 40  MG tablet Take 1 tablet (40 mg total) by mouth daily.   [DISCONTINUED] Continuous Glucose Receiver (DEXCOM G7 RECEIVER) DEVI Use as directed for continuous glucose monitoring. Pt. Lost reader needs a new one   [DISCONTINUED] Continuous Glucose Sensor (DEXCOM G7 SENSOR) MISC Replace sensor every 10 days. For continuous glucose monitoring.   [DISCONTINUED] dapagliflozin  propanediol (FARXIGA ) 10 MG TABS tablet Take 1 tablet (10 mg total) by mouth daily.   [DISCONTINUED] furosemide  (LASIX ) 40 MG tablet Take 1 tablet (40 mg total) by mouth daily.   [DISCONTINUED] metoprolol  succinate (TOPROL -XL) 100 MG 24 hr tablet Take 1 tablet (100 mg total) by mouth daily. Please schedule appointment with Dr. Elmira for future refills. Thank you   [DISCONTINUED] sacubitril -valsartan  (ENTRESTO ) 49-51 MG Take 1 tablet by mouth 2 (two) times daily.   [DISCONTINUED] spironolactone  (ALDACTONE ) 25 MG tablet Take 1 tablet (25 mg total) by mouth daily.     Immunizations Immunization History  Administered Date(s) Administered   Influenza, Seasonal, Injecte, Preservative Fre 12/12/2023, 08/07/2024   Influenza,inj,Quad PF,6+ Mos 12/20/2021   PFIZER(Purple Top)SARS-COV-2 Vaccination 01/02/2020, 01/23/2020   Pfizer(Comirnaty)Fall Seasonal Vaccine 12 years and older 12/12/2023, 08/07/2024   Pneumococcal Polysaccharide-23 05/25/2020   Tdap 05/25/2020   Zoster Recombinant(Shingrix) 06/17/2020     Screening Tests Health Maintenance  Topic Date Due   Medicare Annual Wellness (AWV)  Never done   Mammogram  Never done   Colonoscopy  Never done   Cervical Cancer Screening (HPV/Pap Cotest)  11/07/2024 (Originally 12/10/1998)   Pneumococcal Vaccine: 50+ Years (2 of 2 - PCV) 08/07/2025 (Originally 05/25/2021)   Hepatitis B Vaccines 19-59 Average Risk (1 of 3 - 19+ 3-dose series) 08/07/2025 (Originally 12/11/1987)   OPHTHALMOLOGY EXAM  08/07/2025 (Originally 12/10/1978)   Hepatitis C Screening  08/07/2025 (Originally 12/10/1986)    Zoster Vaccines- Shingrix (2 of 2) 08/07/2025 (Originally 08/12/2020)   HEMOGLOBIN A1C  02/05/2025   Diabetic  kidney evaluation - eGFR measurement  08/07/2025   Diabetic kidney evaluation - Urine ACR  08/07/2025   FOOT EXAM  08/07/2025   DTaP/Tdap/Td (2 - Td or Tdap) 05/25/2030   Influenza Vaccine  Completed   COVID-19 Vaccine  Completed   HIV Screening  Completed   HPV VACCINES  Aged Out   Meningococcal B Vaccine  Aged Out    Health Maintenance Screenings  Health Maintenance Topics with due status: Overdue     Topic Date Due   Medicare Annual Wellness (AWV) Never done   Mammogram Never done   Colonoscopy Never done    Past Medical History:  Diagnosis Date   Anemia    Blood transfusion without reported diagnosis    Gastric ulcer with hemorrhage    Tooth pain    Upper GI bleed 09/25/2014   Vaginal candida 04/25/2022   Past Surgical History:  Procedure Laterality Date   ESOPHAGOGASTRODUODENOSCOPY N/A 09/26/2014   Procedure: ESOPHAGOGASTRODUODENOSCOPY (EGD);  Surgeon: Belvie JONETTA Just, MD;  Location: [facility] ENDOSCOPY;  Service: Endoscopy;  Laterality: N/A;   RIGHT/LEFT HEART CATH AND CORONARY ANGIOGRAPHY N/A 07/25/2021   Procedure: RIGHT/LEFT HEART CATH AND CORONARY ANGIOGRAPHY;  Surgeon: Elmira Newman PARAS, MD;  Location: [facility] INVASIVE CV LAB;  Service: Cardiovascular;  Laterality: N/A;   Family History  Problem Relation Age of Onset   Hypertension Mother    Diabetes Father    Hypertension Sister    Hypertension Brother    Hyperlipidemia Brother    Heart disease Brother    Heart disease Maternal Grandmother    Cancer Maternal Grandfather    Lung cancer Maternal Grandfather    Social History   Socioeconomic History   Marital status: Single    Spouse name: Not on file   Number of children: 2   Years of education: Not on file   Highest education level: Not on file  Occupational History   Occupation: MAINTENANCE  Tobacco Use   Smoking status: Some Days    Current  packs/day: 0.25    Types: Cigarettes   Smokeless tobacco: Never  Vaping Use   Vaping status: Never Used  Substance and Sexual Activity   Alcohol use: Yes    Comment: OCC   Drug use: No   Sexual activity: Not on file  Other Topics Concern   Not on file  Social History Narrative   Not on file   Social Drivers of Health   Financial Resource Strain: Not on file  Food Insecurity: Not on file  Transportation Needs: Not on file  Physical Activity: Not on file  Stress: Not on file  Social Connections: Unknown (03/13/2022)   Received from [facility]   Social Network    Social Network: Not on file    Outpatient Encounter Medications as of 08/07/2024  Medication Sig   azelastine (ASTELIN) 0.1 % nasal spray Place 2 sprays into both nostrils 2 (two) times daily. Use in each nostril as directed   Blood Glucose Monitoring Suppl (BLOOD GLUCOSE MONITOR SYSTEM) w/Device KIT 1 each by Does not apply route in the morning, at noon, and at bedtime. May substitute to any manufacturer covered by patient's insurance.   diclofenac  Sodium (VOLTAREN ) 1 % GEL Apply 4 g topically 4 (four) times daily.   doxycycline (VIBRA-TABS) 100 MG tablet Take 1 tablet (100 mg total) by mouth 2 (two) times daily.   [EXPIRED] fluconazole  (DIFLUCAN ) 150 MG tablet Take 1 tablet (150 mg total) by mouth once for 1 dose. May repeat  in 3 days if symptoms not resolved   Glucose Blood (BLOOD GLUCOSE TEST STRIPS) STRP Test blood sugar 3 times daily   pantoprazole  (PROTONIX ) 40 MG tablet Take 1 tablet (40 mg total) by mouth daily.   Vitamin D , Ergocalciferol , (DRISDOL ) 1.25 MG (50000 UNIT) CAPS capsule Take 1 capsule (50,000 Units total) by mouth every 7 (seven) days.   [DISCONTINUED] atorvastatin  (LIPITOR) 40 MG tablet Take 1 tablet (40 mg total) by mouth daily.   [DISCONTINUED] Continuous Glucose Receiver (DEXCOM G7 RECEIVER) DEVI Use as directed for continuous glucose monitoring. Pt. Lost reader needs a new one    [DISCONTINUED] Continuous Glucose Sensor (DEXCOM G7 SENSOR) MISC Replace sensor every 10 days. For continuous glucose monitoring.   [DISCONTINUED] dapagliflozin  propanediol (FARXIGA ) 10 MG TABS tablet Take 1 tablet (10 mg total) by mouth daily.   [DISCONTINUED] furosemide  (LASIX ) 40 MG tablet Take 1 tablet (40 mg total) by mouth daily.   [DISCONTINUED] metoprolol  succinate (TOPROL -XL) 100 MG 24 hr tablet Take 1 tablet (100 mg total) by mouth daily. Please schedule appointment with Dr. Elmira for future refills. Thank you   [DISCONTINUED] sacubitril -valsartan  (ENTRESTO ) 49-51 MG Take 1 tablet by mouth 2 (two) times daily.   [DISCONTINUED] spironolactone  (ALDACTONE ) 25 MG tablet Take 1 tablet (25 mg total) by mouth daily.   [DISCONTINUED] bismuth-metronidazole -tetracycline (PYLERA) 140-125-125 MG per capsule Take 3 capsules by mouth 4 (four) times daily -  before meals and at bedtime.   [DISCONTINUED] fluconazole  (DIFLUCAN ) 150 MG tablet Take 1 tablet (150 mg total) by mouth every 3 (three) days. (Patient not taking: Reported on 08/07/2024)   [DISCONTINUED] ivabradine  (CORLANOR ) 7.5 MG TABS tablet TAKE 1 TABLET (7.5 MG TOTAL) BY MOUTH 2 (TWO) TIMES DAILY WITH A MEAL. (Patient not taking: Reported on 08/07/2024)   [DISCONTINUED] nystatin -triamcinolone  ointment (MYCOLOG) Apply 1 Application topically 2 (two) times daily. Use very thin layer to area of itching. (Patient not taking: Reported on 08/07/2024)   [DISCONTINUED] sitaGLIPtin-metformin (JANUMET ) 50-500 MG tablet Take 1 tablet at bedtime for 1 week then increase to 1 tablet at bedtime and 1 tablet in the morning every day. (Patient not taking: Reported on 08/07/2024)   No facility-administered encounter medications on file as of 08/07/2024.    Physical Exam:  Physical Exam Vitals and nursing note reviewed.  Constitutional:      General: She is not in acute distress.    Appearance: Normal appearance. She is obese. She is not ill-appearing.   HENT:     Head: Normocephalic and atraumatic.     Right Ear: Tenderness present. A middle ear effusion is present.     Left Ear: Tenderness present. A middle ear effusion is present.     Nose: Nasal tenderness and congestion present.     Mouth/Throat:     Mouth: Mucous membranes are moist.     Pharynx: Oropharynx is clear. Posterior oropharyngeal erythema and postnasal drip present.  Eyes:     Conjunctiva/sclera: Conjunctivae normal.     Pupils: Pupils are equal, round, and reactive to light.  Neck:     Vascular: No carotid bruit.  Cardiovascular:     Rate and Rhythm: Normal rate and regular rhythm.     Pulses: Normal pulses.     Heart sounds: Normal heart sounds.  Pulmonary:     Effort: Pulmonary effort is normal.     Breath sounds: Normal breath sounds.  Abdominal:     General: Bowel sounds are normal. There is no distension.     Palpations:  Abdomen is soft.     Tenderness: There is no abdominal tenderness. There is no right CVA tenderness, left CVA tenderness or guarding.  Musculoskeletal:     Cervical back: No tenderness.     Right lower leg: No edema.     Left lower leg: No edema.  Lymphadenopathy:     Cervical: Cervical adenopathy present.  Skin:    General: Skin is warm and dry.     Capillary Refill: Capillary refill takes less than 2 seconds.  Neurological:     General: No focal deficit present.     Mental Status: She is alert and oriented to person, place, and time.  Psychiatric:        Mood and Affect: Mood normal.     PLAN  Exercise Activities and Dietary Recommendations - choose a type of activity I enjoy such as biking, gardening, team sports, walking - keep track of how long I exercise - keep track of how often I exercise - get a stand-up desk for my workplace - go out for a short walk before breakfast, after dinner or both - park farther away at the shopping mall and walk the extra distance - plan family outings that include physical activity, like  hiking, backpacking, swimming - play your favorite music while exercising - replace a coffee break with a brisk 10-minute walk; ask a friend to go with me - take the stairs instead of the elevator Carb modified diet  Fall Prevention - always use handrails on the stairs - always wear shoes or slippers with non-slip sole - get at least 10 minutes of activity every day  Orders Placed This Encounter  Procedures   MM 3D SCREENING MAMMOGRAM BILATERAL BREAST    Standing Status:   Future    Expiration Date:   08/07/2025    Scheduling Instructions:     Please call patient to schedule    Reason for Exam (SYMPTOM  OR DIAGNOSIS REQUIRED):   screening mammogram    Is the patient pregnant?:   No    [facility]    Release to patient:   Immediate   Flu vaccine trivalent PF, 6mos and older(Flulaval,Afluria,Fluarix,Fluzone)   Pfizer Comirnaty Covid-19 Vaccine 68yrs & older   Hemoglobin A1c    Release to patient:   Immediate   CBC with Differential/Platelet    Release to patient:   Immediate   Comprehensive metabolic panel with GFR    Has the patient fasted?:   Yes    Release to patient:   Immediate   TSH    Release to patient:   Immediate   VITAMIN D  25 Hydroxy (Vit-D Deficiency, Fractures)    Release to patient:   Immediate   Lipid panel    Has the patient fasted?:   Yes    Release to patient:   Immediate   Microalbumin / creatinine urine ratio   Cologuard     I have personally reviewed and noted the following in the patient's chart:   Medical and social history Use of alcohol, tobacco or illicit drugs  Current medications and supplements Functional ability and status Nutritional status Physical activity Advanced directives List of other physicians Hospitalizations, surgeries, and ER visits in previous 12 months Vitals Screenings to include cognitive, depression, and falls Referrals and appointments  In addition, I have reviewed and  discussed with patient certain preventive protocols, quality metrics, and best practice recommendations. A written personalized care plan for preventive services as  well as general preventive health recommendations were provided to patient.   Camie CHARLENA Doing, NP  08/07/2024

## 2024-08-08 LAB — COMPREHENSIVE METABOLIC PANEL WITH GFR
ALT: 17 IU/L (ref 0–32)
AST: 22 IU/L (ref 0–40)
Albumin: 4.5 g/dL (ref 3.8–4.9)
Alkaline Phosphatase: 92 IU/L (ref 49–135)
BUN/Creatinine Ratio: 29 — ABNORMAL HIGH (ref 9–23)
BUN: 16 mg/dL (ref 6–24)
Bilirubin Total: 0.7 mg/dL (ref 0.0–1.2)
CO2: 22 mmol/L (ref 20–29)
Calcium: 9.8 mg/dL (ref 8.7–10.2)
Chloride: 106 mmol/L (ref 96–106)
Creatinine, Ser: 0.56 mg/dL — ABNORMAL LOW (ref 0.57–1.00)
Globulin, Total: 3.2 g/dL (ref 1.5–4.5)
Glucose: 114 mg/dL — ABNORMAL HIGH (ref 70–99)
Potassium: 4.3 mmol/L (ref 3.5–5.2)
Sodium: 143 mmol/L (ref 134–144)
Total Protein: 7.7 g/dL (ref 6.0–8.5)
eGFR: 108 mL/min/1.73 (ref >59)

## 2024-08-08 LAB — LIPID PANEL
Chol/HDL Ratio: 4.8 ratio — ABNORMAL HIGH (ref 0.0–4.4)
Cholesterol, Total: 187 mg/dL (ref 100–199)
HDL: 39 mg/dL — ABNORMAL LOW (ref >39)
LDL Chol Calc (NIH): 116 mg/dL — ABNORMAL HIGH (ref 0–99)
Triglycerides: 180 mg/dL — ABNORMAL HIGH (ref 0–149)
VLDL Cholesterol Cal: 32 mg/dL (ref 5–40)

## 2024-08-08 LAB — CBC WITH DIFFERENTIAL/PLATELET
Basophils Absolute: 0 x10E3/uL (ref 0.0–0.2)
Basos: 0 %
EOS (ABSOLUTE): 0.3 x10E3/uL (ref 0.0–0.4)
Eos: 3 %
Hematocrit: 44.8 % (ref 34.0–46.6)
Hemoglobin: 14.2 g/dL (ref 11.1–15.9)
Immature Grans (Abs): 0 x10E3/uL (ref 0.0–0.1)
Immature Granulocytes: 0 %
Lymphocytes Absolute: 2.6 x10E3/uL (ref 0.7–3.1)
Lymphs: 24 %
MCH: 26.2 pg — ABNORMAL LOW (ref 26.6–33.0)
MCHC: 31.7 g/dL (ref 31.5–35.7)
MCV: 83 fL (ref 79–97)
Monocytes Absolute: 0.8 x10E3/uL (ref 0.1–0.9)
Monocytes: 7 %
Neutrophils Absolute: 7.3 x10E3/uL — ABNORMAL HIGH (ref 1.4–7.0)
Neutrophils: 66 %
Platelets: 177 x10E3/uL (ref 150–450)
RBC: 5.43 x10E6/uL — ABNORMAL HIGH (ref 3.77–5.28)
RDW: 14.2 % (ref 11.7–15.4)
WBC: 11 x10E3/uL — ABNORMAL HIGH (ref 3.4–10.8)

## 2024-08-08 LAB — HEMOGLOBIN A1C
Est. average glucose Bld gHb Est-mCnc: 169 mg/dL
Hgb A1c MFr Bld: 7.5 % — ABNORMAL HIGH (ref 4.8–5.6)

## 2024-08-08 LAB — VITAMIN D 25 HYDROXY (VIT D DEFICIENCY, FRACTURES): Vit D, 25-Hydroxy: 33.7 ng/mL (ref 30.0–100.0)

## 2024-08-08 LAB — MICROALBUMIN / CREATININE URINE RATIO
Creatinine, Urine: 60.5 mg/dL
Microalb/Creat Ratio: 32 mg/g{creat} — ABNORMAL HIGH (ref 0–29)
Microalbumin, Urine: 19.3 ug/mL

## 2024-08-08 LAB — TSH: TSH: 1.03 u[IU]/mL (ref 0.450–4.500)

## 2024-08-13 NOTE — Assessment & Plan Note (Signed)
 Followed with cardiology and on maximum treatment. No concerns at this time.  Recommend continued control in the setting of DM, HLD, HFrEF, and HLD

## 2024-08-13 NOTE — Assessment & Plan Note (Signed)
 Appears to be currently well controlled with maximum medication management.

## 2024-08-13 NOTE — Assessment & Plan Note (Signed)
 On statin therapy with no current concerns.  Recommend continued control in the setting of DM, HTN, HFrEF, and Cardiomyopathy.

## 2024-08-13 NOTE — Assessment & Plan Note (Signed)
 Has sufficient supply of Protonix , but refills have run out. - Send 30-day refill of Protonix  to pharmacy.

## 2024-08-13 NOTE — Assessment & Plan Note (Signed)
 Not currently using Dexcom due to insurance coverage issues. Blood sugar levels are generally well-managed but occasionally fluctuate. Tight control recommended in the setting of HFrEF, HTN, Cardiomyopathy, HLD.  - Check hemoglobin A1c to assess current diabetes control.

## 2024-08-13 NOTE — Assessment & Plan Note (Signed)
 Blood pressure is well-controlled on current medication regimen. Recommend continued control in the setting of DM, HLD, HFrEF, and Cardiomyopathy.

## 2024-08-13 NOTE — Assessment & Plan Note (Signed)
 On maximum therapy with no concerns at this time. Followed closely with cardiology. Recommend continued control in the setting of DM, HLD, HTN, and Cardiomyopathy.

## 2024-08-14 ENCOUNTER — Other Ambulatory Visit (HOSPITAL_BASED_OUTPATIENT_CLINIC_OR_DEPARTMENT_OTHER): Payer: Self-pay

## 2024-08-18 ENCOUNTER — Ambulatory Visit: Payer: Self-pay | Admitting: Nurse Practitioner

## 2024-08-18 ENCOUNTER — Other Ambulatory Visit (HOSPITAL_BASED_OUTPATIENT_CLINIC_OR_DEPARTMENT_OTHER): Payer: Self-pay

## 2024-08-18 DIAGNOSIS — I428 Other cardiomyopathies: Secondary | ICD-10-CM

## 2024-08-18 DIAGNOSIS — I5020 Unspecified systolic (congestive) heart failure: Secondary | ICD-10-CM

## 2024-08-18 DIAGNOSIS — E782 Mixed hyperlipidemia: Secondary | ICD-10-CM

## 2024-08-18 MED ORDER — ATORVASTATIN CALCIUM 40 MG PO TABS
40.0000 mg | ORAL_TABLET | Freq: Every day | ORAL | 0 refills | Status: DC
  Filled 2024-08-18 – 2024-09-08 (×2): qty 90, 90d supply, fill #0

## 2024-08-25 ENCOUNTER — Other Ambulatory Visit: Payer: Self-pay | Admitting: Nurse Practitioner

## 2024-08-25 ENCOUNTER — Other Ambulatory Visit (HOSPITAL_BASED_OUTPATIENT_CLINIC_OR_DEPARTMENT_OTHER): Payer: Self-pay

## 2024-08-25 DIAGNOSIS — E1169 Type 2 diabetes mellitus with other specified complication: Secondary | ICD-10-CM

## 2024-08-25 MED ORDER — METFORMIN HCL ER 500 MG PO TB24
ORAL_TABLET | ORAL | 1 refills | Status: DC
  Filled 2024-08-25: qty 90, 90d supply, fill #0

## 2024-08-26 ENCOUNTER — Other Ambulatory Visit (HOSPITAL_BASED_OUTPATIENT_CLINIC_OR_DEPARTMENT_OTHER): Payer: Self-pay

## 2024-08-31 ENCOUNTER — Encounter: Payer: Self-pay | Admitting: Radiology

## 2024-09-04 ENCOUNTER — Ambulatory Visit

## 2024-09-07 LAB — COLOGUARD: COLOGUARD: NEGATIVE

## 2024-09-08 ENCOUNTER — Other Ambulatory Visit (HOSPITAL_COMMUNITY): Payer: Self-pay

## 2024-09-08 ENCOUNTER — Other Ambulatory Visit: Payer: Self-pay

## 2024-09-08 ENCOUNTER — Other Ambulatory Visit (HOSPITAL_BASED_OUTPATIENT_CLINIC_OR_DEPARTMENT_OTHER): Payer: Self-pay

## 2024-09-15 ENCOUNTER — Other Ambulatory Visit (HOSPITAL_BASED_OUTPATIENT_CLINIC_OR_DEPARTMENT_OTHER): Payer: Self-pay

## 2024-09-23 ENCOUNTER — Ambulatory Visit

## 2024-10-20 ENCOUNTER — Ambulatory Visit

## 2024-10-26 ENCOUNTER — Other Ambulatory Visit: Payer: Self-pay

## 2024-10-27 ENCOUNTER — Telehealth: Payer: Self-pay | Admitting: Cardiology

## 2024-10-27 DIAGNOSIS — I5020 Unspecified systolic (congestive) heart failure: Secondary | ICD-10-CM

## 2024-10-27 DIAGNOSIS — E782 Mixed hyperlipidemia: Secondary | ICD-10-CM

## 2024-10-27 DIAGNOSIS — I428 Other cardiomyopathies: Secondary | ICD-10-CM

## 2024-10-27 NOTE — Telephone Encounter (Signed)
" °*  STAT* If patient is at the pharmacy, call can be transferred to refill team.   1. Which medications need to be refilled? (please list name of each medication and dose if known)  atorvastatin  (LIPITOR) 40 MG tablet  sacubitril -valsartan  (ENTRESTO ) 49-51 MG  metoprolol  succinate (TOPROL -XL) 100 MG 24 hr tablet  2. Would you like to learn more about the convenience, safety, & potential cost savings by using the [facility]? no   3. Are you open to using the [facility] (Type [facility]. no   4. Which pharmacy/location (including street and city if local pharmacy) is medication to be sent to?  [facility]  5. Do they need a 30 day or 90 day supply?  90 day   Pt is out. Sched for 2/25 f/u "

## 2024-10-28 ENCOUNTER — Other Ambulatory Visit: Payer: Self-pay

## 2024-10-28 ENCOUNTER — Other Ambulatory Visit (HOSPITAL_BASED_OUTPATIENT_CLINIC_OR_DEPARTMENT_OTHER): Payer: Self-pay

## 2024-10-28 MED ORDER — ATORVASTATIN CALCIUM 40 MG PO TABS
40.0000 mg | ORAL_TABLET | Freq: Every day | ORAL | 0 refills | Status: DC
  Filled 2024-10-28: qty 90, 90d supply, fill #0

## 2024-10-28 MED ORDER — SACUBITRIL-VALSARTAN 49-51 MG PO TABS
1.0000 | ORAL_TABLET | Freq: Two times a day (BID) | ORAL | 0 refills | Status: DC
  Filled 2024-10-28: qty 180, 90d supply, fill #0

## 2024-10-28 MED ORDER — METOPROLOL SUCCINATE ER 100 MG PO TB24
100.0000 mg | ORAL_TABLET | Freq: Every day | ORAL | 0 refills | Status: DC
  Filled 2024-10-28: qty 90, 90d supply, fill #0

## 2024-10-28 NOTE — Telephone Encounter (Signed)
 Requested Prescriptions   Signed Prescriptions Disp Refills   atorvastatin  (LIPITOR) 40 MG tablet 90 tablet 0    Sig: Take 1 tablet (40 mg total) by mouth daily.    Authorizing Provider: PATWARDHAN, MANISH J    Ordering User: WILFRED, Sanii Kukla  C   metoprolol  succinate (TOPROL -XL) 100 MG 24 hr tablet 90 tablet 0    Sig: Take 1 tablet (100 mg total) by mouth daily.    Authorizing Provider: ELMIRA NEWMAN PARAS    Ordering User: WILFRED, Lowery Paullin  C   sacubitril -valsartan  (ENTRESTO ) 49-51 MG 180 tablet 0    Sig: Take 1 tablet by mouth 2 (two) times daily.    Authorizing Provider: ELMIRA NEWMAN PARAS    Ordering User: WILFRED, Siren Porrata  C

## 2024-11-05 ENCOUNTER — Encounter: Payer: Self-pay | Admitting: Podiatry

## 2024-11-05 ENCOUNTER — Ambulatory Visit (INDEPENDENT_AMBULATORY_CARE_PROVIDER_SITE_OTHER): Admitting: Podiatry

## 2024-11-05 VITALS — Ht 64.0 in | Wt 237.0 lb

## 2024-11-05 DIAGNOSIS — B351 Tinea unguium: Secondary | ICD-10-CM | POA: Diagnosis not present

## 2024-11-05 DIAGNOSIS — M79675 Pain in left toe(s): Secondary | ICD-10-CM

## 2024-11-05 DIAGNOSIS — L600 Ingrowing nail: Secondary | ICD-10-CM | POA: Diagnosis not present

## 2024-11-05 DIAGNOSIS — M79674 Pain in right toe(s): Secondary | ICD-10-CM | POA: Diagnosis not present

## 2024-11-05 NOTE — Patient Instructions (Signed)

## 2024-11-06 ENCOUNTER — Encounter: Payer: Self-pay | Admitting: Podiatry

## 2024-11-06 NOTE — Telephone Encounter (Signed)
 Pt req work note for her daughter that bought her to visit yesterday. I emailed the daughter [email or web address]

## 2024-11-06 NOTE — Progress Notes (Signed)
 Subjective:   Patient ID: Paula Thompson, female   DOB: 54 y.o.   MRN: [id number]   HPI Patient presents with caregiver with severe nail disease 1-5 both feet with a hallux nail left over right being very painful and incurvated with complete inability for her to cut.  States that she has tried this over the years without relief and she needs some kind of relief for her symptoms.  Patient does not smoke obesity noted tries to be active   Review of Systems  All other systems reviewed and are negative.       Objective:  Physical Exam Vitals and nursing note reviewed.  Constitutional:      Appearance: She is well-developed.  Pulmonary:     Effort: Pulmonary effort is normal.  Musculoskeletal:        General: Normal range of motion.  Skin:    General: Skin is warm.  Neurological:     Mental Status: She is alert.     Neurovascular status found to be intact muscle strength adequate range of motion adequate mild equinus noted.  Patient is found to have severe nail disease 1-5 both feet with all nails very incurvated thick and growing abnormally with family history.  Left hallux over right hallux is very tender when pressed in the dorsal direction     Assessment:  Chronic nail disease 1-5 both feet with severe disease left hallux over right with good digital perfusion noted and patient well-oriented     Plan:  H&P reviewed we discussed nail debridement and for the left fifth toe incurvated and painful nail removal recommended.  Today I went ahead and I allowed her to read and signed consent form understanding the surgery and the recovery and the fact we are trying to do this permanent.  I went ahead today and I anesthetized her left big toe 60 mg like Marcaine mixture sterile prep done using sterile instrumentation removed the nail exposed matrix applied phenol 5 applications 30 seconds followed by alcohol lavage sterile dressing and I then went ahead and instructed to leave on 24 hours  take it off earlier if needed and to call with questions.  I debrided nailbeds 1-5 right 2 through 5 left no iatrogenic bleeding reappoint routine care and possible removal of adjacent nailbeds
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
## 2024-11-18 ENCOUNTER — Ambulatory Visit: Payer: Self-pay | Admitting: Nurse Practitioner

## 2024-11-18 ENCOUNTER — Other Ambulatory Visit: Payer: Self-pay

## 2024-11-18 ENCOUNTER — Other Ambulatory Visit (HOSPITAL_BASED_OUTPATIENT_CLINIC_OR_DEPARTMENT_OTHER): Payer: Self-pay

## 2024-11-18 VITALS — BP 130/82 | HR 64 | Wt 244.0 lb

## 2024-11-18 DIAGNOSIS — I152 Hypertension secondary to endocrine disorders: Secondary | ICD-10-CM | POA: Diagnosis not present

## 2024-11-18 DIAGNOSIS — I5020 Unspecified systolic (congestive) heart failure: Secondary | ICD-10-CM | POA: Diagnosis not present

## 2024-11-18 DIAGNOSIS — I509 Heart failure, unspecified: Secondary | ICD-10-CM

## 2024-11-18 DIAGNOSIS — I428 Other cardiomyopathies: Secondary | ICD-10-CM | POA: Diagnosis not present

## 2024-11-18 DIAGNOSIS — E1159 Type 2 diabetes mellitus with other circulatory complications: Secondary | ICD-10-CM

## 2024-11-18 DIAGNOSIS — E785 Hyperlipidemia, unspecified: Secondary | ICD-10-CM | POA: Diagnosis not present

## 2024-11-18 DIAGNOSIS — I5032 Chronic diastolic (congestive) heart failure: Secondary | ICD-10-CM | POA: Diagnosis not present

## 2024-11-18 DIAGNOSIS — Z6841 Body Mass Index (BMI) 40.0 and over, adult: Secondary | ICD-10-CM

## 2024-11-18 DIAGNOSIS — H1013 Acute atopic conjunctivitis, bilateral: Secondary | ICD-10-CM | POA: Diagnosis not present

## 2024-11-18 DIAGNOSIS — Z124 Encounter for screening for malignant neoplasm of cervix: Secondary | ICD-10-CM | POA: Diagnosis not present

## 2024-11-18 DIAGNOSIS — E1169 Type 2 diabetes mellitus with other specified complication: Secondary | ICD-10-CM

## 2024-11-18 DIAGNOSIS — E559 Vitamin D deficiency, unspecified: Secondary | ICD-10-CM

## 2024-11-18 LAB — LIPID PANEL

## 2024-11-18 MED ORDER — DAPAGLIFLOZIN PROPANEDIOL 10 MG PO TABS
10.0000 mg | ORAL_TABLET | Freq: Every day | ORAL | 1 refills | Status: AC
  Filled 2024-11-18: qty 90, 90d supply, fill #0

## 2024-11-18 MED ORDER — SPIRONOLACTONE 25 MG PO TABS
25.0000 mg | ORAL_TABLET | Freq: Every day | ORAL | 1 refills | Status: AC
  Filled 2024-11-18: qty 90, 90d supply, fill #0

## 2024-11-18 MED ORDER — ATORVASTATIN CALCIUM 40 MG PO TABS
40.0000 mg | ORAL_TABLET | Freq: Every day | ORAL | 0 refills | Status: AC
  Filled 2024-11-18: qty 90, 90d supply, fill #0

## 2024-11-18 MED ORDER — OLOPATADINE HCL 0.2 % OP SOLN
OPHTHALMIC | 3 refills | Status: AC
  Filled 2024-11-18: qty 5, 30d supply, fill #0

## 2024-11-18 MED ORDER — TIRZEPATIDE 2.5 MG/0.5ML ~~LOC~~ SOAJ
2.5000 mg | SUBCUTANEOUS | 0 refills | Status: AC
  Filled 2024-11-18: qty 2, 28d supply, fill #0

## 2024-11-18 MED ORDER — FUROSEMIDE 40 MG PO TABS
40.0000 mg | ORAL_TABLET | Freq: Every day | ORAL | 0 refills | Status: AC
  Filled 2024-11-18: qty 30, 30d supply, fill #0

## 2024-11-18 MED ORDER — METFORMIN HCL ER 500 MG PO TB24
ORAL_TABLET | ORAL | 1 refills | Status: AC
  Filled 2024-11-18: qty 90, 90d supply, fill #0

## 2024-11-18 MED ORDER — SACUBITRIL-VALSARTAN 49-51 MG PO TABS
1.0000 | ORAL_TABLET | Freq: Two times a day (BID) | ORAL | 0 refills | Status: AC
  Filled 2024-11-18: qty 180, 90d supply, fill #0

## 2024-11-18 MED ORDER — METOPROLOL SUCCINATE ER 100 MG PO TB24
100.0000 mg | ORAL_TABLET | Freq: Every day | ORAL | 0 refills | Status: AC
  Filled 2024-11-18: qty 90, 90d supply, fill #0

## 2024-11-18 NOTE — Assessment & Plan Note (Addendum)
 Previous notes, medications, and labs reviewed today. New labs ordered.  Farxiga  10mg , Metformin  500mg  XR every day.  Atorvastatin  40mg .  Spironolactone  25mg , entresto , metoprolol  XL 100mg , furosemide  40mg  every day.  Blood sugar levels are fluctuating with recent readings of 175 mg/dL. She is making dietary changes and increasing physical activity. Mounjaro  is considered for better glycemic control and weight management, with potential benefits including improved blood sugar control, weight loss, and reduced knee pain. Insurance coverage for Mounjaro  is confirmed. - Prescribed Mounjaro  once weekly, starting at 0.25 mg, with potential to increase every four weeks as tolerated. - Continue current medications including Farxiga  and Entresto . - Encouraged dietary modifications and regular physical activity. - Monitor blood sugar levels regularly. Orders:   Microalbumin/Creatinine Ratio, Urine   Hemoglobin A1c   CBC with Differential/Platelet   Comprehensive metabolic panel with GFR   Lipid panel   metFORMIN  (GLUCOPHAGE -XR) 500 MG 24 hr tablet; Take 1 tablet by mouth daily during a meal of your choice.   tirzepatide  (MOUNJARO ) 2.5 MG/0.5ML Pen; Inject 2.5 mg into the skin once a week.

## 2024-11-18 NOTE — Assessment & Plan Note (Addendum)
 Grade I Diastolic HF. Last ECHO 02/21/2023. Previous notes, medications, and labs reviewed today. New labs ordered. No alarm symptoms present. Continue current management and close follow-up with cardiology.    Orders:   Microalbumin/Creatinine Ratio, Urine   Hemoglobin A1c   CBC with Differential/Platelet   Comprehensive metabolic panel with GFR   Lipid panel   spironolactone  (ALDACTONE ) 25 MG tablet; Take 1 tablet (25 mg total) by mouth daily.   sacubitril -valsartan  (ENTRESTO ) 49-51 MG; Take 1 tablet by mouth 2 (two) times daily.   metoprolol  succinate (TOPROL -XL) 100 MG 24 hr tablet; Take 1 tablet (100 mg total) by mouth daily.   furosemide  (LASIX ) 40 MG tablet; Take 1 tablet (40 mg total) by mouth daily.   atorvastatin  (LIPITOR) 40 MG tablet; Take 1 tablet (40 mg total) by mouth daily.   dapagliflozin  propanediol (FARXIGA ) 10 MG TABS tablet; Take 1 tablet (10 mg total) by mouth daily.   tirzepatide  (MOUNJARO ) 2.5 MG/0.5ML Pen; Inject 2.5 mg into the skin once a week.

## 2024-11-18 NOTE — Assessment & Plan Note (Addendum)
 Grade I Diastolic HF. Last ECHO 02/21/2023. Previous notes, medications, and labs reviewed today. New labs ordered.    Orders:   dapagliflozin  propanediol (FARXIGA ) 10 MG TABS tablet; Take 1 tablet (10 mg total) by mouth daily.

## 2024-11-18 NOTE — Patient Instructions (Addendum)
 I have sent in for mounjaro  to try for your blood sugar. We will work to get prior authorization for this.  This is once a week.

## 2024-11-19 ENCOUNTER — Telehealth: Payer: Self-pay

## 2024-11-19 LAB — CBC WITH DIFFERENTIAL/PLATELET
Basophils Absolute: 0.1 x10E3/uL (ref 0.0–0.2)
Basos: 1 %
EOS (ABSOLUTE): 0.3 x10E3/uL (ref 0.0–0.4)
Eos: 3 %
Hematocrit: 43 % (ref 34.0–46.6)
Hemoglobin: 14 g/dL (ref 11.1–15.9)
Immature Grans (Abs): 0 x10E3/uL (ref 0.0–0.1)
Immature Granulocytes: 0 %
Lymphocytes Absolute: 3 x10E3/uL (ref 0.7–3.1)
Lymphs: 30 %
MCH: 26 pg — ABNORMAL LOW (ref 26.6–33.0)
MCHC: 32.6 g/dL (ref 31.5–35.7)
MCV: 80 fL (ref 79–97)
Monocytes Absolute: 0.9 x10E3/uL (ref 0.1–0.9)
Monocytes: 9 %
Neutrophils Absolute: 5.7 x10E3/uL (ref 1.4–7.0)
Neutrophils: 57 %
Platelets: 201 x10E3/uL (ref 150–450)
RBC: 5.39 x10E6/uL — ABNORMAL HIGH (ref 3.77–5.28)
RDW: 15.6 % — ABNORMAL HIGH (ref 11.7–15.4)
WBC: 10 x10E3/uL (ref 3.4–10.8)

## 2024-11-19 LAB — COMPREHENSIVE METABOLIC PANEL WITH GFR
ALT: 16 IU/L (ref 0–32)
AST: 15 IU/L (ref 0–40)
Albumin: 4.4 g/dL (ref 3.8–4.9)
Alkaline Phosphatase: 86 IU/L (ref 49–135)
BUN/Creatinine Ratio: 25 — AB (ref 9–23)
BUN: 15 mg/dL (ref 6–24)
Bilirubin Total: 0.8 mg/dL (ref 0.0–1.2)
CO2: 18 mmol/L — AB (ref 20–29)
Calcium: 9.7 mg/dL (ref 8.7–10.2)
Chloride: 103 mmol/L (ref 96–106)
Creatinine, Ser: 0.59 mg/dL (ref 0.57–1.00)
Globulin, Total: 3 g/dL (ref 1.5–4.5)
Glucose: 125 mg/dL — AB (ref 70–99)
Potassium: 4.3 mmol/L (ref 3.5–5.2)
Sodium: 140 mmol/L (ref 134–144)
Total Protein: 7.4 g/dL (ref 6.0–8.5)
eGFR: 106 mL/min/1.73

## 2024-11-19 LAB — HEMOGLOBIN A1C
Est. average glucose Bld gHb Est-mCnc: 174 mg/dL
Hgb A1c MFr Bld: 7.7 % — ABNORMAL HIGH (ref 4.8–5.6)

## 2024-11-19 LAB — LIPID PANEL
Cholesterol, Total: 174 mg/dL (ref 100–199)
HDL: 37 mg/dL — AB
LDL CALC COMMENT:: 4.7 ratio — AB (ref 0.0–4.4)
LDL Chol Calc (NIH): 92 mg/dL (ref 0–99)
Triglycerides: 270 mg/dL — AB (ref 0–149)
VLDL Cholesterol Cal: 45 mg/dL — AB (ref 5–40)

## 2024-11-19 LAB — VITAMIN D 25 HYDROXY (VIT D DEFICIENCY, FRACTURES): Vit D, 25-Hydroxy: 31.5 ng/mL (ref 30.0–100.0)

## 2024-11-19 LAB — MICROALBUMIN / CREATININE URINE RATIO
Creatinine, Urine: 43.5 mg/dL
Microalb/Creat Ratio: 16 mg/g{creat} (ref 0–29)
Microalbumin, Urine: 6.9 ug/mL

## 2024-11-19 NOTE — Telephone Encounter (Signed)
 Copied from CRM [id number]. Topic: Clinical - Medication Question >> Nov 19, 2024  1:12 PM Rexford HERO wrote: Reason for CRM: Patient calling because she was put a new medicaton (Mounjaro ), and is asking to make sure she can stop taking Metformin . Can respond through Mychart.

## 2024-11-25 ENCOUNTER — Other Ambulatory Visit: Payer: Self-pay | Admitting: Nurse Practitioner

## 2024-11-25 ENCOUNTER — Ambulatory Visit: Payer: Self-pay | Admitting: Nurse Practitioner

## 2024-11-25 ENCOUNTER — Other Ambulatory Visit (HOSPITAL_BASED_OUTPATIENT_CLINIC_OR_DEPARTMENT_OTHER): Payer: Self-pay

## 2024-11-25 DIAGNOSIS — E1159 Type 2 diabetes mellitus with other circulatory complications: Secondary | ICD-10-CM

## 2024-11-25 DIAGNOSIS — I5032 Chronic diastolic (congestive) heart failure: Secondary | ICD-10-CM

## 2024-11-25 DIAGNOSIS — Z6841 Body Mass Index (BMI) 40.0 and over, adult: Secondary | ICD-10-CM | POA: Insufficient documentation

## 2024-11-25 DIAGNOSIS — E1169 Type 2 diabetes mellitus with other specified complication: Secondary | ICD-10-CM

## 2024-11-25 DIAGNOSIS — I5020 Unspecified systolic (congestive) heart failure: Secondary | ICD-10-CM

## 2024-11-25 DIAGNOSIS — I428 Other cardiomyopathies: Secondary | ICD-10-CM

## 2024-11-25 MED ORDER — REPATHA PUSHTRONEX SYSTEM 420 MG/3.5ML ~~LOC~~ SOCT
SUBCUTANEOUS | 11 refills | Status: DC
  Filled 2024-11-25: qty 3.5, 30d supply, fill #0

## 2024-11-25 MED ORDER — REPATHA SURECLICK 140 MG/ML ~~LOC~~ SOAJ
140.0000 mg | SUBCUTANEOUS | 2 refills | Status: AC
  Filled 2024-11-25: qty 2, 28d supply, fill #0

## 2024-11-25 NOTE — Assessment & Plan Note (Signed)
 Discussed potential benefits of Mounjaro  for weight loss, including improved blood sugar control and reduced knee pain. She is open to trying Mounjaro  if covered by insurance. - Prescribed Mounjaro  once weekly for weight management.

## 2024-11-30 ENCOUNTER — Other Ambulatory Visit (HOSPITAL_BASED_OUTPATIENT_CLINIC_OR_DEPARTMENT_OTHER): Payer: Self-pay

## 2024-12-23 ENCOUNTER — Ambulatory Visit: Admitting: Physician Assistant

## 2025-02-08 ENCOUNTER — Ambulatory Visit: Payer: Self-pay | Admitting: Nurse Practitioner

## 2025-08-23 ENCOUNTER — Encounter: Payer: Self-pay | Admitting: Nurse Practitioner
